# Patient Record
Sex: Male | Born: 1955 | Race: White | Hispanic: No | Marital: Married | Smoking: Former smoker
Health system: Southern US, Community
[De-identification: ages and names within clinical notes are randomized; demographics above are authoritative.]

## PROBLEM LIST (undated history)

## (undated) DIAGNOSIS — K56609 Unspecified intestinal obstruction, unspecified as to partial versus complete obstruction: Secondary | ICD-10-CM

## (undated) DIAGNOSIS — H269 Unspecified cataract: Secondary | ICD-10-CM

## (undated) DIAGNOSIS — M069 Rheumatoid arthritis, unspecified: Secondary | ICD-10-CM

## (undated) DIAGNOSIS — K635 Polyp of colon: Secondary | ICD-10-CM

## (undated) DIAGNOSIS — M1990 Unspecified osteoarthritis, unspecified site: Secondary | ICD-10-CM

## (undated) DIAGNOSIS — M359 Systemic involvement of connective tissue, unspecified: Secondary | ICD-10-CM

## (undated) DIAGNOSIS — N200 Calculus of kidney: Secondary | ICD-10-CM

## (undated) DIAGNOSIS — K5790 Diverticulosis of intestine, part unspecified, without perforation or abscess without bleeding: Secondary | ICD-10-CM

## (undated) HISTORY — DX: Polyp of colon: K63.5

## (undated) HISTORY — PX: CATARACT EXTRACTION, BILATERAL: SHX1313

## (undated) HISTORY — DX: Diverticulosis of intestine, part unspecified, without perforation or abscess without bleeding: K57.90

## (undated) HISTORY — DX: Unspecified osteoarthritis, unspecified site: M19.90

## (undated) HISTORY — DX: Rheumatoid arthritis, unspecified: M06.9

## (undated) HISTORY — DX: Unspecified cataract: H26.9

## (undated) HISTORY — DX: Unspecified intestinal obstruction, unspecified as to partial versus complete obstruction: K56.609

## (undated) HISTORY — DX: Calculus of kidney: N20.0

## (undated) HISTORY — PX: ELBOW SURGERY: SHX618

## (undated) HISTORY — PX: TONSILLECTOMY: SUR1361

---

## 2000-12-05 ENCOUNTER — Ambulatory Visit (HOSPITAL_COMMUNITY): Admission: RE | Admit: 2000-12-05 | Discharge: 2000-12-05 | Payer: Self-pay | Admitting: Family Medicine

## 2000-12-05 ENCOUNTER — Encounter: Payer: Self-pay | Admitting: Family Medicine

## 2002-02-21 ENCOUNTER — Inpatient Hospital Stay (HOSPITAL_COMMUNITY): Admission: EM | Admit: 2002-02-21 | Discharge: 2002-02-23 | Payer: Self-pay | Admitting: Emergency Medicine

## 2002-06-29 ENCOUNTER — Encounter: Payer: Self-pay | Admitting: Emergency Medicine

## 2002-06-29 ENCOUNTER — Emergency Department (HOSPITAL_COMMUNITY): Admission: EM | Admit: 2002-06-29 | Discharge: 2002-06-29 | Payer: Self-pay | Admitting: Emergency Medicine

## 2003-07-21 ENCOUNTER — Encounter: Admission: RE | Admit: 2003-07-21 | Discharge: 2003-07-21 | Payer: Self-pay | Admitting: Family Medicine

## 2003-07-21 ENCOUNTER — Encounter: Payer: Self-pay | Admitting: Family Medicine

## 2003-07-31 ENCOUNTER — Ambulatory Visit (HOSPITAL_COMMUNITY): Admission: RE | Admit: 2003-07-31 | Discharge: 2003-07-31 | Payer: Self-pay | Admitting: Urology

## 2003-09-12 ENCOUNTER — Encounter: Admission: RE | Admit: 2003-09-12 | Discharge: 2003-09-12 | Payer: Self-pay

## 2003-09-14 ENCOUNTER — Encounter: Admission: RE | Admit: 2003-09-14 | Discharge: 2003-09-14 | Payer: Self-pay

## 2003-10-18 ENCOUNTER — Emergency Department (HOSPITAL_COMMUNITY): Admission: EM | Admit: 2003-10-18 | Discharge: 2003-10-18 | Payer: Self-pay | Admitting: Emergency Medicine

## 2003-10-20 ENCOUNTER — Emergency Department (HOSPITAL_COMMUNITY): Admission: EM | Admit: 2003-10-20 | Discharge: 2003-10-20 | Payer: Self-pay | Admitting: Emergency Medicine

## 2005-06-12 ENCOUNTER — Emergency Department (HOSPITAL_COMMUNITY): Admission: EM | Admit: 2005-06-12 | Discharge: 2005-06-12 | Payer: Self-pay | Admitting: Emergency Medicine

## 2005-10-20 ENCOUNTER — Emergency Department (HOSPITAL_COMMUNITY): Admission: EM | Admit: 2005-10-20 | Discharge: 2005-10-20 | Payer: Self-pay | Admitting: Emergency Medicine

## 2006-07-24 ENCOUNTER — Encounter: Admission: RE | Admit: 2006-07-24 | Discharge: 2006-07-24 | Payer: Self-pay

## 2007-07-03 ENCOUNTER — Emergency Department (HOSPITAL_COMMUNITY): Admission: EM | Admit: 2007-07-03 | Discharge: 2007-07-03 | Payer: Self-pay | Admitting: Emergency Medicine

## 2007-07-03 ENCOUNTER — Ambulatory Visit (HOSPITAL_COMMUNITY): Admission: RE | Admit: 2007-07-03 | Discharge: 2007-07-03 | Payer: Self-pay

## 2008-12-15 DIAGNOSIS — R1030 Lower abdominal pain, unspecified: Secondary | ICD-10-CM

## 2008-12-15 DIAGNOSIS — K6289 Other specified diseases of anus and rectum: Secondary | ICD-10-CM

## 2008-12-15 DIAGNOSIS — K625 Hemorrhage of anus and rectum: Secondary | ICD-10-CM

## 2008-12-15 DIAGNOSIS — K649 Unspecified hemorrhoids: Secondary | ICD-10-CM

## 2008-12-17 DIAGNOSIS — M069 Rheumatoid arthritis, unspecified: Secondary | ICD-10-CM

## 2008-12-18 ENCOUNTER — Ambulatory Visit: Payer: Self-pay | Admitting: Gastroenterology

## 2008-12-18 DIAGNOSIS — K219 Gastro-esophageal reflux disease without esophagitis: Secondary | ICD-10-CM

## 2008-12-18 DIAGNOSIS — K644 Residual hemorrhoidal skin tags: Secondary | ICD-10-CM | POA: Insufficient documentation

## 2008-12-18 DIAGNOSIS — Z87442 Personal history of urinary calculi: Secondary | ICD-10-CM

## 2008-12-18 LAB — CONVERTED CEMR LAB
Ferritin: 101.6 ng/mL (ref 22.0–322.0)
Folate: 8.7 ng/mL
Iron: 98 ug/dL (ref 42–165)
Saturation Ratios: 26.8 % (ref 20.0–50.0)
Transferrin: 261 mg/dL (ref 212.0–360.0)
Vitamin B-12: 403 pg/mL (ref 211–911)

## 2009-09-25 ENCOUNTER — Emergency Department (HOSPITAL_COMMUNITY): Admission: EM | Admit: 2009-09-25 | Discharge: 2009-09-25 | Payer: Self-pay | Admitting: Emergency Medicine

## 2009-10-28 ENCOUNTER — Encounter: Admission: RE | Admit: 2009-10-28 | Discharge: 2010-01-26 | Payer: Self-pay | Admitting: Orthopedic Surgery

## 2011-02-11 NOTE — Consult Note (Signed)
[place]. [facility]  Patient:    Ronald Cameron, Ronald Cameron Visit Number: [id number] MRN: [id number]          Service Type: MED Location: [phone] Attending Physician:  Jackie Plum Dictated by:   Francisca December, M.D. Proc. Date: 02/21/02 Admit Date:  02/21/2002   CC:         Ronald Cameron, M.D.  Ronald Cameron., M.D.   Consultation Report  REASON FOR CONSULTATION:  Chest pain.  IMPRESSION: 1. Unstable angina pectoris without diagnostic electrocardiogram changes. 2. Tobacco abuse. 3. Rheumatoid arthritis with chronic prednisone use.  RECOMMENDATIONS:  Agree with initiation of aspirin, IV heparin, and IV nitroglycerin.  Would add Plavix 150 mg p.o. now, 75 mg p.o. q.d.  Will schedule a diagnostic cardiac catheterization, possible PTCA as soon as possible.  Goals and risks for the procedures and alternatives were discussed. The patient states his understanding.  He has his questions answered and wishes to proceed.  HISTORY OF PRESENT ILLNESS:  [name] is a 55 year old man who this morning while driving to work developed the spontaneous onset of anterior substernal aching and heavy pressure sensation.  It was not associated with nausea but he did become pale, felt lightheaded, and had a sweat.  There was mild shortness of breath.  The pain did not radiate.  He responded to baby aspirin and three sublingual nitroglycerin when he was brought to the emergency room by EMS.  In the emergency room, he has had one additional episode of chest discomfort, and in fact, complains of mild anterior substernal discomfort at this time. So far, he has received the aspirin and IV nitroglycerin.  The heparin is being administered during this dictation.  PAST MEDICAL HISTORY: 1. Rheumatoid arthritis diagnosed in 2001. 2. Status post remote tonsillectomy.  MEDICATIONS: 1. Prednisone 10 mg p.o. q.d. 2. New arthritis medicine started three  days ago, question name. 3. Calcium and vitamin D 600 mg p.o. b.i.d.  RISK FACTORS:  His cardiac risk factors include age, sex, as well as tobacco abuse.  No history of diabetes mellitus or hypertension.  There is a family history of coronary disease.  It began before the age of 46 in his mother.  SOCIAL HISTORY:  He works as a Curator.  He is married.  He has no children. He does not drink any alcohol.  He has a one pack to one and a half pack of cigarettes per day smoker.  FAMILY HISTORY:  Mother also had rheumatic heart disease and is status post bilateral CEA.  His father had bypass surgery at age 3.  Died at age 58.  ALLERGIES:  None known.  REVIEW OF SYSTEMS:  He has a got a headache currently because of nitroglycerin.  He denies any visual changes.  He has not had any difficulty swallowing.  No cough or hemoptysis.  He denies any GERD symptoms.  No abdominal pain, melena, bright red blood per rectum.  He has had no dysuria, hematuria, or nocturia.  No history of lung, liver, or renal problems.  There is no history of thyroid disease.  His rheumatoid arthritis mostly effects his hands at this point.  PHYSICAL EXAMINATION:  VITAL SIGNS:  Blood pressure is 107/71, pulse is 70 and regular, respiratory rate 18.  Temperature 97.7.  GENERAL:  This is a well-appearing 55 year old Caucasian male in no acute distress.  HEENT:  Unremarkable.  The head is normocephalic and atraumatic.  The pupils are equal, round  and reactive to light and accommodation.  Extraocular movements intact.  Oral mucosa is pink and moist.  Tongue is not coated.  NECK:  Supple without thyromegaly or masses.  The carotid upstrokes are normal.  There is no bruit.  There is no jugular venous distension.  CHEST:  Clear with adequate excursion bilaterally.  Normal vesicular breath sounds are heard.  Throughout the precordium is quiet.  Normal S1 and S2. Heart sounds are muffled.  No murmur, click, gallop, or  rub.  ABDOMEN:  Soft and nontender.  No hepatosplenomegaly or midline pulsatile masses.  GENITALIA:  Normal male phallus.  Descended testicles.  No lesions.  RECTAL:  Not performed.  EXTREMITIES:  Full range of motion.  No edema.  Intact distal pulses.  NEUROLOGICAL:  Cranial nerves II-XII intact.  Motor and sensory are grossly intact.  Gait not tested.  The hands show mild MTP synovitis without significant tenderness.  No rheumatoid deformity.  SKIN:  Warm, dry, and clear.  LABORATORY DATA:  Initial CK is 87, MB 1.4, troponin 0.04.  Serum electrolytes, BUN, creatinine, glucose normal.  Admission hemogram is normal. Calcium is 8.5.  Electrocardiogram:  Three tracings are available for review (1608, 1841, and 1842) all from the 29th.  They show sinus rhythm, right axis and early repolarization.  No clear evidence of significant ST elevation or change. There is J-point elevation in V2 and V3.  The second tracing was done while he had a recurrence of his chest discomfort.  COMMENTS:  The patient displays symptoms entirely compatible with unstable angina pectoris.  He has had some waxing and waning of the discomfort.  He does not have significant gastroesophageal reflux disease history that might explain this.  Despite the absence of EKG findings, I am concerned as you are that this is the new presentation of coronary artery disease and should be treated as such until proven otherwise.  The plan is outlined above.  Thank you very much for allowing me to assist in the care of [name].  It is my pleasure to do so.  I will discuss further care with you. Dictated by:   Francisca December, M.D. Attending Physician:  Jackie Plum DD:  02/21/02 TD:  02/23/02 Job: [id number]

## 2011-02-11 NOTE — Discharge Summary (Signed)
[place]. [facility]  Patient:    Ronald Cameron, Ronald Cameron Visit Number: [id number] MRN: [id number]          Service Type: MED Location: [phone] Attending Physician:  Jackie Plum Dictated by:   Jackie Plum, M.D. Admit Date:  02/21/2002 Discharge Date: 02/23/2002   CC:         Ronald Maduro A. Eliezer Lofts., M.D.  Francisca December, M.D.   Discharge Summary  DISCHARGE DIAGNOSES 1. Chest pain, resolved, non cardiac in etiology.    a. Cardiac catheterization done on Feb 22, 2002, notable for nonobstructive       atherosclerotic coronary artery disease with normal left ventricular       function. Official report pending. 2. A 20 to 30 pack year history of cigarette smoking. 3. Rheumatoid arthritis diagnosed one year ago on chronic prednisone. 4. Status post tonsillectomy.  DISCHARGE LABS: White blood cell count 9.1 hemoglobin 14.2, hematocrit 41.7, platelet count 294. PTT 26. Sodium 138, potassium 3.7, chloride 109, CO2 25, glucose 110, BUN 22, creatinine 0.8, calcium 8.2.  DISCHARGE MEDICATIONS 1. Prednisone 10 mg p.o. q.d. 2. Nicotine patch 21 mg/24 hour patch transdermal q.d. 3. Protonix 40 mg p.o. q.d.  DISCHARGE INSTRUCTIONS: As tolerated. Regular diet. Special instructions: Stop smoking. The detrimental effect of smoking on his general health including his cardiac health has been discussed with the patient.  FOLLOWUP APPOINTMENTS: The patient is to call Dr. Nicholos Johns for a followup appointment, his primary care physician within two weeks.  CONSULTS: Dr. Amil Amen of cardiology.  PROCEDURES PERFORMED: Cardiac catheterization on Feb 22, 2002.  CONDITION ON DISCHARGE: Improved and stable.  DISPOSITION: The patient is to go home with his family.  HISTORY OF PRESENT ILLNESS: The patient is a 55 year old gentleman with CAD risk factors of male sex, cigarette smoking and positive family history for CAD, who presented on Feb 21, 2002, with chest  pain. The pain was said to be in his left sternal border area and started while he was driving to work. He felt dizzy, pale and diaphoretic. He also had an episode of mild shortness of breath. The patient was rated as 6/10. It was not radiating. There was no history of nausea.  On evaluation in the emergency department the patient was given sublingual nitroglycerin x 3 with good response with respect to his pain.  PHYSICAL EXAMINATION  VITAL SIGNS: Admission physical examination was notable for a blood pressure of 107/71 with a pulse of 69, respiratory rate of 18 and oxygen saturation of 97% on room air.  GENERAL: Physical examination was notable for absence of any noted JVD. He was not pale.  LUNGS: The lung examination was clear without any chest wall tenderness.  STUDIES: EKG was notable for normal sinus rhythm with right atrial dilatation. X-ray was notable for absence of any acute infiltrate.  HOSPITAL COURSE: The patient was admitted for suspected unstable angina to the step-down unit. He was started on oxygen supplementation, aspirin and heparin, Plavix and nitroglycerin drip. A myocardial infarction was ruled out by serial cardiac enzymes and 12-lead EKG. Dr. Amil Amen was consulted and the patient had a cardiac catheterization yesterday. The results are mentioned above. At the time of discharge the patient was chest pain free. He was hemodynamically stable. His hemoglobin and PTT are mentioned above. On account of the cardiac catheterization findings, it was agreed that the source of the patients chest pain is not cardiac, possibly GI, and the patient was started on Protonix for presumptive  GI etiology. Should the patient experience any further chest pain, noncardiac sources should be looked for.  Tobacco use. The patient is a cigarette smoker as mentioned above. He has been started on a nicotine patch. He has received extensive counseling regarding the effects of  cigarette smoking on his general health including his cardiac health. He expressed understanding and we recommend that he received an outpatient cigarette cessation reinforced counseling. Dictated by:   Jackie Plum, M.D. Attending Physician:  Jackie Plum DD:  02/23/02 TD:  02/25/02 Job: [id number]

## 2011-02-11 NOTE — Cardiovascular Report (Signed)
[place]. [facility]  Patient:    Ronald Cameron, Ronald Cameron Visit Number: [id number] MRN: [id number]          Service Type: MED Location: [phone] Attending Physician:  Jackie Plum Dictated by:   Armanda Magic, M.D. Proc. Date: 02/22/02 Admit Date:  02/21/2002 Discharge Date: 02/23/2002   CC:         [name], M.D.  Anastasio Auerbach, M.D.  Francisca December, M.D.   Cardiac Catheterization  REFERRING PHYSICIANS: [name], M.D. and Anastasio Auerbach, M.D.  PROCEDURES PERFORMED: Left heart catheterization, coronary angiography, left ventriculography.  OPERATOR: Armanda Magic, M.D.  INDICATIONS: Chest pain.  COMPLICATIONS: None.  INTERVENOUS ACCESS: Via right femoral artery, 6 French sheath.  HISTORY OF PRESENT ILLNESS: This is a 55 year old male with cardiac risks factors including age, sex, tobacco use, positive family history of coronary disease, who was admitted with substernal chest pain. He ruled out for myocardial infarction by serial enzymes. Dr. Amil Amen was consulted and felt the patient needed cardiac catheterization and we are now performing cardiac catheterization for further evaluation of chest pain.  DESCRIPTION OF PROCEDURE: The patient is brought to the cardiac catheterization laboratory in a fasting, nonsedated state.  Informed consent was obtained.  The patient was connected to continuous heart rate and pulse oximetry monitoring, and intermittent blood pressure monitoring.  The right groin was prepped and draped in a sterile fashion. Xylocaine 1% was used for local anesthesia.  Using the modified Seldinger technique, a 6 French sheath was placed in the right femoral artery.  Under fluoroscopic guidance, a 6 [place] JL4 catheter was placed in the left coronary artery. Multiple cine films were taken in a 30-degree RAO, 40-degree LAO views. This catheter was then exchanged out over a wire and a 6 [place] Judkins JR4  catheter, which was placed under fluoroscopic guidance in the right coronary artery. Multiple cine films were taken in a 30-degree RAO, 40-degree LAO views. This catheter was then exchanged out over a wire and a 6 French angled pigtail catheter, which was placed under fluoroscopic guidance in the left ventricular cavity. Left ventriculography was performed in a 30-degree RAO view using a total of 30 cc of contrast at 13 cc/sec. The catheter was then pulled back across the aortic valve with no significant pressure gradient. At the end of the procedure, all catheters and sheaths were removed. Manual compression was performed until adequate hemostasis obtained. The patient was transferred back to the room in stable condition.  RESULTS: 1. The left main coronary artery is widely patent and bifurcates into a    left anterior descending artery and left circumflex artery. 2. The left anterior descending artery is widely patent throughout its    course except for some luminal irregularities. It gives rise to one    large diagonal branch which is widely patent. 3. Left circumflex is widely patent throughout its course and gives rise to    three obtuse marginal branches. The first obtuse marginal branch has a    proximal 30% narrowing. The second obtuse marginal branch is very small    and the third obtuse marginal branch has a 50-60% ostial narrowing. 4. Right coronary artery is widely patent throughout its course except for    luminal irregularities up to 20%. There is a distal 30% narrowing just    before the bifurcation of the posterior descending artery and    posterolateral artery, both of which are widely patent except for  some    luminal irregularities in the posterolateral artery up to 20%.  LEFT VENTRICULOGRAM: The left ventriculography performed in the 30-degree RAO view using a total of 30 cc of contrast at 13 cc/sec. showed normal LV function. No regional wall motion abnormalities.  Aortic pressure 103/62, LV 113/23 mmHg.  ASSESSMENT: 1. Noncardiac chest pain. 2. Nonobstructive coronary disease. 3. Normal left ventricular function.  PLAN: Further plans per Dr. Amil Amen. Dictated by:   Armanda Magic, M.D. Attending Physician:  Jackie Plum DD:  04/12/02 TD:  04/17/02 Job: [id number]

## 2011-03-22 ENCOUNTER — Other Ambulatory Visit: Payer: Self-pay | Admitting: Internal Medicine

## 2011-03-22 ENCOUNTER — Ambulatory Visit
Admission: RE | Admit: 2011-03-22 | Discharge: 2011-03-22 | Disposition: A | Discharge status: Home / Self Care | Payer: Worker's Compensation | Source: Ambulatory Visit | Attending: Internal Medicine | Admitting: Internal Medicine

## 2011-03-22 DIAGNOSIS — S0992XA Unspecified injury of nose, initial encounter: Secondary | ICD-10-CM

## 2011-07-07 LAB — CBC
HCT: 39.3
Hemoglobin: 13.6
MCHC: 34.7
MCV: 92.4
Platelets: 366
RBC: 4.26
RDW: 14.3 — ABNORMAL HIGH
WBC: 10.6 — ABNORMAL HIGH

## 2011-07-07 LAB — DIFFERENTIAL
Basophils Absolute: 0
Basophils Relative: 0
Eosinophils Absolute: 0.6
Eosinophils Relative: 6 — ABNORMAL HIGH
Lymphocytes Relative: 20
Lymphs Abs: 2.1
Monocytes Absolute: 0.7
Monocytes Relative: 6
Neutro Abs: 7.2
Neutrophils Relative %: 68

## 2011-07-07 LAB — SEDIMENTATION RATE: Sed Rate: 45 — ABNORMAL HIGH

## 2013-12-16 ENCOUNTER — Encounter: Payer: Self-pay | Admitting: Family Medicine

## 2013-12-16 ENCOUNTER — Telehealth: Payer: Self-pay | Admitting: Nurse Practitioner

## 2013-12-16 ENCOUNTER — Ambulatory Visit (INDEPENDENT_AMBULATORY_CARE_PROVIDER_SITE_OTHER): Payer: BC Managed Care – PPO | Admitting: Family Medicine

## 2013-12-16 ENCOUNTER — Ambulatory Visit (INDEPENDENT_AMBULATORY_CARE_PROVIDER_SITE_OTHER): Payer: BC Managed Care – PPO

## 2013-12-16 VITALS — BP 121/69 | HR 70 | Temp 99.9°F | Ht 71.0 in | Wt 199.0 lb

## 2013-12-16 DIAGNOSIS — B9689 Other specified bacterial agents as the cause of diseases classified elsewhere: Secondary | ICD-10-CM

## 2013-12-16 DIAGNOSIS — Z87442 Personal history of urinary calculi: Secondary | ICD-10-CM

## 2013-12-16 DIAGNOSIS — M71121 Other infective bursitis, right elbow: Secondary | ICD-10-CM | POA: Insufficient documentation

## 2013-12-16 DIAGNOSIS — A499 Bacterial infection, unspecified: Secondary | ICD-10-CM

## 2013-12-16 DIAGNOSIS — K219 Gastro-esophageal reflux disease without esophagitis: Secondary | ICD-10-CM

## 2013-12-16 DIAGNOSIS — M7020 Olecranon bursitis, unspecified elbow: Secondary | ICD-10-CM

## 2013-12-16 DIAGNOSIS — M25529 Pain in unspecified elbow: Secondary | ICD-10-CM

## 2013-12-16 DIAGNOSIS — M069 Rheumatoid arthritis, unspecified: Secondary | ICD-10-CM

## 2013-12-16 MED ORDER — CEPHALEXIN 500 MG PO CAPS: 500.0000 mg | ORAL_CAPSULE | Freq: Four times a day (QID) | ORAL | Status: DC

## 2013-12-16 MED ORDER — CEFTRIAXONE SODIUM 1 G IJ SOLR
1.0000 g | Freq: Once | INTRAMUSCULAR | Status: AC
  Administered 2013-12-16: 1 g via INTRAMUSCULAR

## 2013-12-16 MED ORDER — SULFAMETHOXAZOLE-TMP DS 800-160 MG PO TABS: 1.0000 | ORAL_TABLET | Freq: Two times a day (BID) | ORAL | Status: DC

## 2013-12-16 NOTE — Telephone Encounter (Signed)
WIFE STATES PT ELBOW IS DRAINING BLOOD AND VERY SWOLLEN. PT IS FOLLOWED BY A RHEUMATOLOGIST. I ADVISED TO CALL RHEUMATOLOGIST AND SEE IS THEY WANT TO SEE HIM. PT IS ON MULTIPLE MEDICATIONS FOR ARTHRITIS. MAY NEED ER VISIT TODAY. WIFE VERBALIZED UNDERSTANDING.

## 2013-12-16 NOTE — Patient Instructions (Addendum)
Olecranon Bursitis Bursitis is swelling and soreness (inflammation) of a fluid-filled sac (bursa) that covers and protects a joint. Olecranon bursitis occurs over the elbow.  CAUSES Bursitis can be caused by injury, overuse of the joint, arthritis, or infection.  SYMPTOMS   Tenderness, swelling, warmth, or redness over the elbow.  Elbow pain with movement. This is greater with bending the elbow.  Squeaking sound when the bursa is rubbed or moved.  Increasing size of the bursa without pain or discomfort.  Fever with increasing pain and swelling if the bursa becomes infected. HOME CARE INSTRUCTIONS   Put ice on the affected area.  Put ice in a plastic bag.  Place a towel between your skin and the bag.  Leave the ice on for 15-20 minutes each hour while awake. Do this for the first 2 days.  When resting, elevate your elbow above the level of your heart. This helps reduce swelling.  Continue to put the joint through a full range of motion 4 times per day. Rest the injured joint at other times. When the pain lessens, begin normal slow movements and usual activities.  Only take over-the-counter or prescription medicines for pain, discomfort, or fever as directed by your caregiver.  Reduce your intake of milk and related dairy products (cheese, yogurt). They may make your condition worse. SEEK IMMEDIATE MEDICAL CARE IF:   Your pain increases even during treatment.  You have a fever.  You have heat and inflammation over the bursa and elbow.  You have a red line that goes up your arm.  You have pain with movement of your elbow. MAKE SURE YOU:   Understand these instructions.  Will watch your condition.  Will get help right away if you are not doing well or get worse. Document Released: 10/12/2006 Document Revised: 12/05/2011 Document Reviewed: 08/28/2007 [facility] Patient Information 2014 [place], [place]. Ceftriaxone injection What is this medicine? CEFTRIAXONE (sef try AX  one) is a cephalosporin antibiotic. It is used to treat certain kinds of bacterial infections. It will not work for colds, flu, or other viral infections. This medicine may be used for other purposes; ask your health care provider or pharmacist if you have questions. COMMON BRAND NAME(S): Rocephin What should I tell my health care provider before I take this medicine? They need to know if you have any of these conditions: -any chronic illness -bowel disease, like colitis -both kidney and liver disease -high bilirubin level in newborn patients -an unusual or allergic reaction to ceftriaxone, other cephalosporin or penicillin antibiotics, foods, dyes or preservatives -pregnant or trying to get pregnant -breast-feeding How should I use this medicine? This medicine is injected into a muscle or infused it into a vein. It is usually given in a medical office or clinic. If you are to give this medicine you will be taught how to inject it. Follow instructions carefully. Use your doses at regular intervals. Do not take your medicine more often than directed. Do not skip doses or stop your medicine early even if you feel better. Do not stop taking except on your doctor's advice. Talk to your pediatrician regarding the use of this medicine in children. Special care may be needed. Overdosage: If you think you have taken too much of this medicine contact a poison control center or emergency room at once. NOTE: This medicine is only for you. Do not share this medicine with others. What if I miss a dose? If you miss a dose, take it as soon as you  can. If it is almost time for your next dose, take only that dose. Do not take double or extra doses. What may interact with this medicine? Do not take this medicine with any of the following medications: -intravenous calcium This list may not describe all possible interactions. Give your health care provider a list of all the medicines, herbs, non-prescription  drugs, or dietary supplements you use. Also tell them if you smoke, drink alcohol, or use illegal drugs. Some items may interact with your medicine. What should I watch for while using this medicine? Tell your doctor or health care professional if your symptoms do not improve or if they get worse. Do not treat diarrhea with over the counter products. Contact your doctor if you have diarrhea that lasts more than 2 days or if it is severe and watery. If you are being treated for a sexually transmitted disease, avoid sexual contact until you have finished your treatment. Having sex can infect your sexual partner. Calcium may bind to this medicine and cause lung or kidney problems. Avoid calcium products while taking this medicine and for 48 hours after taking the last dose of this medicine. What side effects may I notice from receiving this medicine? Side effects that you should report to your doctor or health care professional as soon as possible: -allergic reactions like skin rash, itching or hives, swelling of the face, lips, or tongue -breathing problems -fever, chills -irregular heartbeat -pain when passing urine -seizures -stomach pain, cramps -unusual bleeding, bruising -unusually weak or tired Side effects that usually do not require medical attention (report to your doctor or health care professional if they continue or are bothersome): -diarrhea -dizzy, drowsy -headache -nausea, vomiting -pain, swelling, irritation where injected -stomach upset -sweating This list may not describe all possible side effects. Call your doctor for medical advice about side effects. You may report side effects to FDA at 1-800-FDA-1088. Where should I keep my medicine? Keep out of the reach of children. Store at room temperature below 25 degrees C (77 degrees F). Protect from light. Throw away any unused vials after the expiration date. NOTE: This sheet is a summary. It may not cover all possible  information. If you have questions about this medicine, talk to your doctor, pharmacist, or health care provider.  2014, [employer]vier/Gold Standard. (2012-09-24 15:34:57)

## 2013-12-16 NOTE — Progress Notes (Signed)
Patient ID: Ronald Cameron, male   DOB: Dec 15, 1955, 58 y.o.   MRN: [id number] SUBJECTIVE: CC: Chief Complaint  Patient presents with  . right elbow pain    drainging, started yesterday    HPI: Is on methotrexate for RA.  Easy to get infections. Yesterday the right elbow got red and swollen. Then started to leak yellow fluid and he squeezed it out and cleaned it but it has gotten redder and warmer. The joint is okay for now.  no fever.  Past Medical History  Diagnosis Date  . Arthritis    Past Surgical History  Procedure Laterality Date  . Tonsillectomy     History   Social History  . Marital Status: Married    Spouse Name: N/A    Number of Children: N/A  . Years of Education: N/A   Occupational History  . Not on file.   Social History Main Topics  . Smoking status: Current Every Day Smoker  . Smokeless tobacco: Not on file  . Alcohol Use: No  . Drug Use: No  . Sexual Activity: Not on file   Other Topics Concern  . Not on file   Social History Narrative  . No narrative on file   History reviewed. No pertinent family history. No current outpatient prescriptions on file prior to visit.   No current facility-administered medications on file prior to visit.   No Known Allergies  There is no immunization history on file for this patient. Prior to Admission medications   Medication Sig Start Date End Date Taking? Authorizing Provider  Ascorbic Acid (VITAMIN C) 1000 MG tablet Take 1,000 mg by mouth daily.   Yes Historical Provider, MD  aspirin 81 MG tablet Take 81 mg by mouth daily.   Yes Historical Provider, MD  calcium carbonate (OS-CAL) 600 MG TABS tablet Take 600 mg by mouth daily with breakfast.   Yes Historical Provider, MD  folic acid (FOLVITE) 790 MCG tablet Take 800 mcg by mouth daily.   Yes Historical Provider, MD  methotrexate (RHEUMATREX) 2.5 MG tablet Take 2.5 mg by mouth once a week. Pt takes 10 tablets weekly. Caution:Chemotherapy. Protect  from light.   Yes Historical Provider, MD  predniSONE (DELTASONE) 5 MG tablet Take 5 mg by mouth daily with breakfast.   Yes Historical Provider, MD  traZODone (DESYREL) 100 MG tablet Take 100 mg by mouth at bedtime as needed for sleep.   Yes Historical Provider, MD  cephALEXin (KEFLEX) 500 MG capsule Take 1 capsule (500 mg total) by mouth 4 (four) times daily. 12/16/13   Vernie Shanks, MD  sulfamethoxazole-trimethoprim (BACTRIM DS) 800-160 MG per tablet Take 1 tablet by mouth 2 (two) times daily. 12/16/13   Vernie Shanks, MD     ROS: As above in the HPI. All other systems are stable or negative.  OBJECTIVE: APPEARANCE:  Patient in no acute distress.The patient appeared well nourished and normally developed. Acyanotic. Waist: VITAL SIGNS:BP 121/69  Pulse 70  Temp(Src) 99.9 F (37.7 C) (Oral)  Ht 5\' 11"  (1.803 m)  Wt 199 lb (90.266 kg)  BMI 27.77 kg/m2 WM  SKIN: warm and  Dry without overt rashes, tattoos and scars  HEAD and Neck: without JVD, Head and scalp: normal Eyes:No scleral icterus. Fundi normal, eye movements normal. Ears: Auricle normal, canal normal, Tympanic membranes normal, insufflation normal. Nose: normal Throat: normal Neck & thyroid: normal  CHEST & LUNGS: Chest wall: normal Lungs: Clear  CVS: Reveals the PMI to be normally  located. Regular rhythm, First and Second Heart sounds are normal,  absence of murmurs, rubs or gallops. Peripheral vasculature: Radial pulses: normal Dorsal pedis pulses: normal Posterior pulses: normal  ABDOMEN:  Appearance: normal Benign, no organomegaly, no masses, no Abdominal Aortic enlargement. No Guarding , no rebound. No Bruits. Bowel sounds: normal  RECTAL: N/A GU: N/A  EXTREMETIES: nonedematous.  MUSCULOSKELETAL:  Spine: normal Joints:the right olecranon area has a 4 cm red area over the olecranon bursa. There is drainage out of multiple openings. The fluid is cloudy.. There is warmth.  NEUROLOGIC: oriented  to time,place and person; nonfocal. Strength is normal Sensory is normal Reflexes are normal Cranial Nerves are normal.  ASSESSMENT: Septic olecranon bursitis of right elbow - Plan: sulfamethoxazole-trimethoprim (BACTRIM DS) 800-160 MG per tablet, cephALEXin (KEFLEX) 500 MG capsule, Aerobic culture, Ambulatory referral to Orthopedic Surgery, cefTRIAXone (ROCEPHIN) injection 1 g  Pain in elbow - Plan: DG Elbow 2 Views Right, Aerobic culture  RHEUMATOID ARTHRITIS  NEPHROLITHIASIS, HX OF  GERD  PLAN: Area cleaned and dressed. Handout on olecranon bursitis.  Orders Placed This Encounter  Procedures  . Aerobic culture  . DG Elbow 2 Views Right    Standing Status: Future     Number of Occurrences: 1     Standing Expiration Date: 02/15/2015    Order Specific Question:  Reason for Exam (SYMPTOM  OR DIAGNOSIS REQUIRED)    Answer:  pain    Order Specific Question:  Preferred imaging location?    Answer:  Internal  . Ambulatory referral to Orthopedic Surgery    Referral Priority:  Urgent    Referral Type:  Surgical    Referral Reason:  Specialty Services Required    Requested Specialty:  Orthopedic Surgery    Number of Visits Requested:  1  [facility] reading (PRIMARY) by  Dr.Saajan Willmon: no acute bony abnormality                                Meds ordered this encounter  Medications  . methotrexate (RHEUMATREX) 2.5 MG tablet    Sig: Take 2.5 mg by mouth once a week. Pt takes 10 tablets weekly. Caution:Chemotherapy. Protect from light.  . predniSONE (DELTASONE) 5 MG tablet    Sig: Take 5 mg by mouth daily with breakfast.  . folic acid (FOLVITE) 270 MCG tablet    Sig: Take 800 mcg by mouth daily.  Marland Kitchen aspirin 81 MG tablet    Sig: Take 81 mg by mouth daily.  . Ascorbic Acid (VITAMIN C) 1000 MG tablet    Sig: Take 1,000 mg by mouth daily.  . traZODone (DESYREL) 100 MG tablet    Sig: Take 100 mg by mouth at bedtime as needed for sleep.  . calcium carbonate (OS-CAL) 600 MG TABS tablet     Sig: Take 600 mg by mouth daily with breakfast.  . sulfamethoxazole-trimethoprim (BACTRIM DS) 800-160 MG per tablet    Sig: Take 1 tablet by mouth 2 (two) times daily.    Dispense:  20 tablet    Refill:  0  . cephALEXin (KEFLEX) 500 MG capsule    Sig: Take 1 capsule (500 mg total) by mouth 4 (four) times daily.    Dispense:  40 capsule    Refill:  0  . cefTRIAXone (ROCEPHIN) injection 1 g    Sig:    There are no discontinued medications. Return in about 1 day (around 12/17/2013) for Recheck medical problems.  Novali Vollman P. Jacelyn Grip, M.D.

## 2013-12-16 NOTE — Progress Notes (Signed)
PT TOLERATED ROCEPHIN INJECTION WITHOUT DIFFICULTY

## 2013-12-17 ENCOUNTER — Encounter: Payer: Self-pay | Admitting: Family Medicine

## 2013-12-17 ENCOUNTER — Ambulatory Visit: Payer: Self-pay | Admitting: Family Medicine

## 2013-12-17 ENCOUNTER — Ambulatory Visit (INDEPENDENT_AMBULATORY_CARE_PROVIDER_SITE_OTHER): Payer: BC Managed Care – PPO | Admitting: Family Medicine

## 2013-12-17 VITALS — BP 126/71 | HR 67 | Temp 98.6°F | Ht 71.0 in | Wt 199.0 lb

## 2013-12-17 DIAGNOSIS — B9689 Other specified bacterial agents as the cause of diseases classified elsewhere: Secondary | ICD-10-CM

## 2013-12-17 DIAGNOSIS — M71121 Other infective bursitis, right elbow: Secondary | ICD-10-CM

## 2013-12-17 DIAGNOSIS — M7020 Olecranon bursitis, unspecified elbow: Secondary | ICD-10-CM

## 2013-12-17 DIAGNOSIS — A499 Bacterial infection, unspecified: Secondary | ICD-10-CM

## 2013-12-17 DIAGNOSIS — M069 Rheumatoid arthritis, unspecified: Secondary | ICD-10-CM

## 2013-12-17 NOTE — Progress Notes (Signed)
Patient ID: Ronald Cameron, male   DOB: [birth year], 57 y.o.   MRN: [id number] SUBJECTIVE: CC: Chief Complaint  Patient presents with  . Follow-up    1 day reck rt elbow.  draining serous sangious fluid    HPI: Next day recheck of septic arthritis. Still draining. No fever. Still sore . Elbow can move without problems.  Past Medical History  Diagnosis Date  . Arthritis    Past Surgical History  Procedure Laterality Date  . Tonsillectomy     History   Social History  . Marital Status: Married    Spouse Name: N/A    Number of Children: N/A  . Years of Education: N/A   Occupational History  . Not on file.   Social History Main Topics  . Smoking status: Current Every Day Smoker  . Smokeless tobacco: Not on file  . Alcohol Use: No  . Drug Use: No  . Sexual Activity: Not on file   Other Topics Concern  . Not on file   Social History Narrative  . No narrative on file   No family history on file. Current Outpatient Prescriptions on File Prior to Visit  Medication Sig Dispense Refill  . Ascorbic Acid (VITAMIN C) 1000 MG tablet Take 1,000 mg by mouth daily.      Marland Kitchen aspirin 81 MG tablet Take 81 mg by mouth daily.      . calcium carbonate (OS-CAL) 600 MG TABS tablet Take 600 mg by mouth daily with breakfast.      . cephALEXin (KEFLEX) 500 MG capsule Take 1 capsule (500 mg total) by mouth 4 (four) times daily.  40 capsule  0  . folic acid (FOLVITE) 767 MCG tablet Take 800 mcg by mouth daily.      . methotrexate (RHEUMATREX) 2.5 MG tablet Take 2.5 mg by mouth once a week. Pt takes 10 tablets weekly. Caution:Chemotherapy. Protect from light.      . predniSONE (DELTASONE) 5 MG tablet Take 5 mg by mouth daily with breakfast.      . sulfamethoxazole-trimethoprim (BACTRIM DS) 800-160 MG per tablet Take 1 tablet by mouth 2 (two) times daily.  20 tablet  0  . traZODone (DESYREL) 100 MG tablet Take 100 mg by mouth at bedtime as needed for sleep.       No current  facility-administered medications on file prior to visit.   No Known Allergies  There is no immunization history on file for this patient. Prior to Admission medications   Medication Sig Start Date End Date Taking? Authorizing Provider  Ascorbic Acid (VITAMIN C) 1000 MG tablet Take 1,000 mg by mouth daily.   Yes Historical Provider, MD  aspirin 81 MG tablet Take 81 mg by mouth daily.   Yes Historical Provider, MD  calcium carbonate (OS-CAL) 600 MG TABS tablet Take 600 mg by mouth daily with breakfast.   Yes Historical Provider, MD  cephALEXin (KEFLEX) 500 MG capsule Take 1 capsule (500 mg total) by mouth 4 (four) times daily. 12/16/13  Yes Vernie Shanks, MD  folic acid (FOLVITE) 341 MCG tablet Take 800 mcg by mouth daily.   Yes Historical Provider, MD  methotrexate (RHEUMATREX) 2.5 MG tablet Take 2.5 mg by mouth once a week. Pt takes 10 tablets weekly. Caution:Chemotherapy. Protect from light.   Yes Historical Provider, MD  predniSONE (DELTASONE) 5 MG tablet Take 5 mg by mouth daily with breakfast.   Yes Historical Provider, MD  sulfamethoxazole-trimethoprim (BACTRIM DS) 800-160 MG per tablet Take  1 tablet by mouth 2 (two) times daily. 12/16/13  Yes Vernie Shanks, MD  traZODone (DESYREL) 100 MG tablet Take 100 mg by mouth at bedtime as needed for sleep.   Yes Historical Provider, MD     ROS: As above in the HPI. All other systems are stable or negative.  OBJECTIVE: APPEARANCE:  Patient in no acute distress.The patient appeared well nourished and normally developed. Acyanotic. Waist: VITAL SIGNS:BP 126/71  Pulse 67  Temp(Src) 98.6 F (37 C) (Oral)  Ht 5\' 11"  (1.803 m)  Wt 199 lb (90.266 kg)  BMI 27.77 kg/m2   SKIN: warm and  Dry  The olecranon area is less red. Still draining. Swelling is a little less. Cloudy fluid.  HEAD and Neck: without JVD, Head and scalp: normal Eyes:No scleral icterus. Fundi normal, eye movements normal. Ears: Auricle normal, canal normal, Tympanic  membranes normal, insufflation normal. Nose: normal Throat: normal Neck & thyroid: normal  CHEST & LUNGS: Chest wall: normal Lungs: Clear  CVS: Reveals the PMI to be normally located. Regular rhythm, First and Second Heart sounds are normal,  absence of murmurs, rubs or gallops. Peripheral vasculature: Radial pulses: normal Dorsal pedis pulses: normal Posterior pulses: normal  EXTREMETIES: right elbow, olecranon area as above in the skin exam. Less warmth as well. 40 % better.  MUSCULOSKELETAL:  Joints:right elbow FROM . Only area of pain is the right olecranon bursa.   NEUROLOGIC: oriented to time,place and person; nonfocal.  Results for orders placed in visit on 12/18/08  CONVERTED [facility]      Result Value Ref Range   Vitamin B-12 403  211-911 pg/mL   Ferritin 101.6  22.0-322.0 ng/mL   Folate 8.7     Iron 98  42-165 mcg/dL   Transferrin 261.0  212.0-360.0 mg/dL   Saturation Ratios 26.8  20.0-50.0 %    ASSESSMENT:  Rheumatoid arthritis(714.0)  Septic olecranon bursitis of right elbow  PLAN: Continue with 2 antibiotics. Referred to orthopedics yesterday. If not seen in 2 days RTc to see me for recheck. Stop smoking.  No orders of the defined types were placed in this encounter.   No orders of the defined types were placed in this encounter.   There are no discontinued medications. Return if symptoms worsen or fail to improve.  Takoda Janowiak P. Jacelyn Grip, M.D.

## 2013-12-18 ENCOUNTER — Telehealth: Payer: Self-pay | Admitting: Family Medicine

## 2013-12-20 LAB — AEROBIC CULTURE

## 2014-05-23 ENCOUNTER — Ambulatory Visit (INDEPENDENT_AMBULATORY_CARE_PROVIDER_SITE_OTHER): Payer: BC Managed Care – PPO | Admitting: Family Medicine

## 2014-05-23 ENCOUNTER — Encounter: Payer: Self-pay | Admitting: Family Medicine

## 2014-05-23 VITALS — BP 104/65 | HR 73 | Temp 97.9°F | Ht 70.5 in | Wt 201.0 lb

## 2014-05-23 DIAGNOSIS — L0293 Carbuncle, unspecified: Secondary | ICD-10-CM

## 2014-05-23 DIAGNOSIS — L0292 Furuncle, unspecified: Secondary | ICD-10-CM

## 2014-05-23 MED ORDER — CEPHALEXIN 500 MG PO CAPS: 500.0000 mg | ORAL_CAPSULE | Freq: Four times a day (QID) | ORAL | Status: DC

## 2014-05-23 NOTE — Progress Notes (Signed)
   Subjective:    Patient ID: Ronald Cameron, male    DOB: Feb 17, 1956, 58 y.o.   MRN: [id number]  HPI C/o boil on back that has been bothering him for 2 days   Review of Systems C/o boil on back. No chest pain, SOB, HA, dizziness, vision change, N/V, diarrhea, constipation, dysuria, urinary urgency or frequency, myalgias, arthralgias or rash.     Objective:   Physical Exam  Sebaceous cyst on back.  Procedure - Cyst cleansed with betadine and then injected with 3 cc's of lidocaine with epinephrine And once adequate anesthesia is acquired a horizontal incision is made and copious amount of sebum is removed and then most of the sack is excised away and then 1/4 inch iodophor packing is Packed into the wound and then 4x4 dressing       Assessment & Plan:  Furuncle - Plan: cephALEXin (KEFLEX) 500 MG capsule po qid x 7 days. Discussed with patient that he can follow up for wound check in 3 days.  He  States he has taken care of this on his own and will follow up if he needs follow up.  Lysbeth Penner FNP

## 2015-02-10 ENCOUNTER — Other Ambulatory Visit: Payer: Self-pay | Admitting: Rheumatology

## 2015-02-10 DIAGNOSIS — R188 Other ascites: Secondary | ICD-10-CM

## 2015-02-13 ENCOUNTER — Ambulatory Visit
Admission: RE | Admit: 2015-02-13 | Discharge: 2015-02-13 | Disposition: A | Discharge status: Home / Self Care | Payer: BLUE CROSS/BLUE SHIELD | Source: Ambulatory Visit | Attending: Rheumatology | Admitting: Rheumatology

## 2015-02-13 DIAGNOSIS — R188 Other ascites: Secondary | ICD-10-CM

## 2015-02-17 ENCOUNTER — Emergency Department (HOSPITAL_COMMUNITY)
Admission: EM | Admit: 2015-02-17 | Discharge: 2015-02-17 | Disposition: A | Discharge status: Home / Self Care | Payer: BLUE CROSS/BLUE SHIELD | Attending: Emergency Medicine | Admitting: Emergency Medicine

## 2015-02-17 ENCOUNTER — Encounter (HOSPITAL_COMMUNITY): Payer: Self-pay | Admitting: *Deleted

## 2015-02-17 ENCOUNTER — Emergency Department (HOSPITAL_COMMUNITY): Payer: BLUE CROSS/BLUE SHIELD

## 2015-02-17 DIAGNOSIS — Z79899 Other long term (current) drug therapy: Secondary | ICD-10-CM | POA: Insufficient documentation

## 2015-02-17 DIAGNOSIS — Z792 Long term (current) use of antibiotics: Secondary | ICD-10-CM | POA: Diagnosis not present

## 2015-02-17 DIAGNOSIS — R42 Dizziness and giddiness: Secondary | ICD-10-CM | POA: Diagnosis not present

## 2015-02-17 DIAGNOSIS — M1990 Unspecified osteoarthritis, unspecified site: Secondary | ICD-10-CM | POA: Insufficient documentation

## 2015-02-17 DIAGNOSIS — Z7982 Long term (current) use of aspirin: Secondary | ICD-10-CM | POA: Diagnosis not present

## 2015-02-17 DIAGNOSIS — R140 Abdominal distension (gaseous): Secondary | ICD-10-CM | POA: Diagnosis not present

## 2015-02-17 DIAGNOSIS — Z87891 Personal history of nicotine dependence: Secondary | ICD-10-CM | POA: Insufficient documentation

## 2015-02-17 DIAGNOSIS — Z7952 Long term (current) use of systemic steroids: Secondary | ICD-10-CM | POA: Diagnosis not present

## 2015-02-17 DIAGNOSIS — R1084 Generalized abdominal pain: Secondary | ICD-10-CM | POA: Diagnosis present

## 2015-02-17 NOTE — ED Notes (Signed)
Pt sent here for evaluation from Urgent Care

## 2015-02-17 NOTE — Discharge Instructions (Signed)

## 2015-02-17 NOTE — ED Provider Notes (Signed)
CSN: [id number]     Arrival date & time 02/17/15  2014 History  This chart was scribed for Ronald Fraise, MD by Jeanell Sparrow, ED Scribe. This patient was seen in room [facility]14/[facility]14 and the patient's care was started at 8:56 PM.   Chief Complaint  Patient presents with  . Abdominal Pain   Patient is a 59 y.o. male presenting with abdominal pain. The history is provided by the patient. No language interpreter was used.  Abdominal Pain Pain location:  Generalized Pain quality: pressure   Pain radiates to:  Does not radiate Pain severity:  Moderate Onset quality:  Gradual Duration:  3 weeks Timing:  Constant Progression:  Worsening Chronicity:  New Relieved by:  None tried Worsened by:  Nothing tried Ineffective treatments:  None tried Associated symptoms: no chest pain, no constipation, no diarrhea, no fever, no shortness of breath and no vomiting    HPI Comments: Ronald Cameron is a 59 y.o. male who presents to the Emergency Department complaining of constant moderate generalized abdominal pain that started about 3 weeks ago. He reports that he has been having abdominal pain with bloating for the past 3 weeks. He states that he was sent here from Urgent Care. He reports that he had an ultrasound ordered by his arthritis doctor for "fluid buildup" about a week ago. He describes the pain as a pressure sensation with no modifying factors. He reports as a result of the abdominal bloating he has been having decreased appetite and has not been able to finish meals. He states that also has had a weight gain of 10-15 lbs, and intermittent lightheadedness. He reports having no prior hx of today's symptoms. He denies any fever, vomiting, diarrhea, constipation, chest pain, SOB, leg swelling, or back pain.    Past Medical History  Diagnosis Date  . Arthritis    Past Surgical History  Procedure Laterality Date  . Tonsillectomy    . Elbow surgery     History reviewed. No pertinent family  history. History  Substance Use Topics  . Smoking status: Former Smoker    Types: Cigarettes  . Smokeless tobacco: Not on file  . Alcohol Use: No    Review of Systems  Constitutional: Positive for appetite change and unexpected weight change. Negative for fever.  Respiratory: Negative for shortness of breath.   Cardiovascular: Negative for chest pain and leg swelling.  Gastrointestinal: Positive for abdominal pain and abdominal distention. Negative for vomiting, diarrhea and constipation.  Musculoskeletal: Negative for back pain.  Neurological: Positive for light-headedness.  All other systems reviewed and are negative.    Allergies  Review of patient's allergies indicates no known allergies.  Home Medications   Prior to Admission medications   Medication Sig Start Date End Date Taking? Authorizing Provider  Ascorbic Acid (VITAMIN C) 1000 MG tablet Take 1,000 mg by mouth daily.    Historical Provider, MD  aspirin 81 MG tablet Take 81 mg by mouth daily.    Historical Provider, MD  calcium carbonate (OS-CAL) 600 MG TABS tablet Take 600 mg by mouth daily with breakfast.    Historical Provider, MD  cephALEXin (KEFLEX) 500 MG capsule Take 1 capsule (500 mg total) by mouth 4 (four) times daily. 05/23/14   Lysbeth Penner, FNP  folic acid (FOLVITE) 818 MCG tablet Take 800 mcg by mouth daily.    Historical Provider, MD  methotrexate (RHEUMATREX) 2.5 MG tablet Take 2.5 mg by mouth once a week. Pt takes 10 tablets weekly. Caution:Chemotherapy.  Protect from light.    Historical Provider, MD  predniSONE (DELTASONE) 5 MG tablet Take 5 mg by mouth daily with breakfast.    Historical Provider, MD  Tocilizumab (ACTEMRA) 162 MG/0.9ML SOSY Inject 162 mg into the skin once a week.    Historical Provider, MD  traZODone (DESYREL) 100 MG tablet Take 100 mg by mouth at bedtime as needed for sleep.    Historical Provider, MD   BP 119/83 mmHg  Pulse 70  Temp(Src) 98.8 F (37.1 C)  Resp 20  Ht 5\' 11"   (1.803 m)  Wt 209 lb (94.802 kg)  BMI 29.16 kg/m2  SpO2 99% Physical Exam  Nursing note and vitals reviewed. CONSTITUTIONAL: Well developed/well nourished HEAD: Normocephalic/atraumatic EYES: EOMI/PERRL ENMT: Mucous membranes moist NECK: supple no meningeal signs SPINE/BACK:entire spine nontender CV: S1/S2 noted, no murmurs/rubs/gallops noted LUNGS: Lungs are clear to auscultation bilaterally, no apparent distress ABDOMEN: soft, nontender, no rebound or guarding, bowel sounds noted throughout abdomen, mild distension noted GU:no cva tenderness NEURO: Pt is awake/alert/appropriate, moves all extremitiesx4.  No facial droop.   EXTREMITIES: pulses normal/equal, full ROM, no LE edema is noted SKIN: warm, color normal PSYCH: no abnormalities of mood noted, alert and oriented to situation  ED Course  Procedures  DIAGNOSTIC STUDIES: Oxygen Saturation is 99% on RA, normal by my interpretation.    COORDINATION OF CARE: 9:00 PM- Pt advised of plan for treatment which includes radiology and pt agrees.  Pt well appearing, watching TV, no distress No focal tenderness Low suspicion for acute abdominal obstruction (no vomiting, no tenderness, watching TV and is comfortable) He went to PCP office late and was told to go to "hospital" and then went to urgent care who sent him here for CT scan.  I don't feel emergent CT imaging required as he has no tenderness/no vomiting and this has been ongoing for 3 weeks Referred back to PCP and GI Discussed strict return precautions   Imaging Review Dg Abd Acute W/chest  02/17/2015   CLINICAL DATA:  Generalized abdominal pain starting 3 weeks ago  EXAM: DG ABDOMEN ACUTE W/ 1V CHEST  COMPARISON:  10/18/2003  FINDINGS: Cardiomediastinal silhouette is unremarkable. No acute infiltrate or pleural effusion. No pulmonary edema.  Mild gaseous distended small bowel loops in right abdomen suspicious for mild ileus or enteritis. Some colonic stool noted in right  colon. No free abdominal air.  IMPRESSION: No acute disease within chest. Mild gaseous distended small bowel loops in right abdomen suspicious for mild ileus or enteritis. No small bowel air-fluid levels. No free abdominal air.   Electronically Signed   By: Lahoma Crocker M.D.   On: 02/17/2015 21:55     MDM   Final diagnoses:  Abdominal distention    Nursing notes including past medical history and social history reviewed and considered in documentation xrays/imaging reviewed by myself and considered during evaluation   I personally performed the services described in this documentation, which was scribed in my presence. The recorded information has been reviewed and is accurate.       Ronald Fraise, MD 02/17/15 [phone]

## 2015-02-17 NOTE — ED Notes (Signed)
Pt c/o bloating and generalized abd pressure for 3 weeks, denies N/V/D, LBM this morning and normal per pt

## 2015-02-20 ENCOUNTER — Encounter: Payer: Self-pay | Admitting: Gastroenterology

## 2015-02-20 ENCOUNTER — Ambulatory Visit (INDEPENDENT_AMBULATORY_CARE_PROVIDER_SITE_OTHER): Payer: BLUE CROSS/BLUE SHIELD | Admitting: Gastroenterology

## 2015-02-20 VITALS — BP 132/77 | HR 60 | Temp 98.1°F | Ht 71.0 in | Wt 209.6 lb

## 2015-02-20 DIAGNOSIS — R1907 Generalized intra-abdominal and pelvic swelling, mass and lump: Secondary | ICD-10-CM | POA: Diagnosis not present

## 2015-02-20 DIAGNOSIS — R1013 Epigastric pain: Secondary | ICD-10-CM

## 2015-02-20 DIAGNOSIS — R6881 Early satiety: Secondary | ICD-10-CM | POA: Diagnosis not present

## 2015-02-20 DIAGNOSIS — Z79899 Other long term (current) drug therapy: Secondary | ICD-10-CM | POA: Diagnosis not present

## 2015-02-20 MED ORDER — DEXLANSOPRAZOLE 60 MG PO CPDR: 60.0000 mg | DELAYED_RELEASE_CAPSULE | Freq: Every day | ORAL | Status: DC

## 2015-02-20 NOTE — Patient Instructions (Signed)
1. Start dexalant 60 mg 30 minutes before breakfast daily. Samples provided. Will help with ulcers/gastritis. 2. CT scan of your abdomen as scheduled.  3. I will review copy of labs from Dr. Ouida Sills as soon as available and let you know if further labs are needed.

## 2015-02-20 NOTE — Progress Notes (Signed)
Primary Care Physician:  Redge Gainer, MD  Primary Gastroenterologist:  Garfield Cornea, MD   Chief Complaint  Patient presents with  . Abdominal Pain    More pressure/ bloating very bad    HPI:  Ronald Cameron is a 59 y.o. male here as a new patient, for further evaluation of abdominal swelling, abdominal pain. Patient was advised to see GI after ER visit recently. He reports over the past 3-4 weeks he's had a significant change. He complains of significant abdominal distention, abdomen feels hard, puts pressure on his lungs making it difficult to breathe. Symptoms are worse with meals. Makes him not want to eat. Complains of early satiety. No vomiting. Has some minimal heartburn symptoms for which he takes times. Usually related to certain foods. No dysphagia. Bowel movements are regular usually 2-3 daily, stools are soft, no melena or rectal bleeding. Historically he has had issues with "gas pains". These pains are different from current issues. Usually occurs sporadically, maybe go months in between without symptoms. Pain usually resolves after belching or passing gas. One time had chest pain related to this, negative cardiac workup in the ER.  Recently saw his rheumatologist Dr. Ouida Sills. He was concerned that he may have ascites and sent him for abdominal ultrasound on 02/13/2015 which was negative. Limited visualization of the pancreas. Subsequently he went to an urgent care who referred him to the ER to have a CT scan for distended hard abdomen. In the emergency department, he had acute abdominal series on 02/17/2015 showed mild gaseous distention of the small bowel loops in the right abdomen suspicious for mild ileus or enteritis. No labs obtained.  Patient's medical history significant for severe rheumatoid arthritis, has been on prednisone (low dose) for years, methotrexate, Actemra. Last year he developed staph infection of the right elbow requiring 3 surgeries, parental antibiotics  at home, infectious disease consultation. He was out of work about 6 months. Related to this. He had come off of all of his rheumatoid arthritis medications because of active infection which debilitated him. Reports currently he was advised to come off ofActemra because Dr. Ouida Sills worried about infection in his belly as a cause of his pain and swelling.   His weight is up about 10 pounds in the past one year.   Current Outpatient Prescriptions  Medication Sig Dispense Refill  . Ascorbic Acid (VITAMIN C) 1000 MG tablet Take 1,000 mg by mouth daily.    Marland Kitchen aspirin 81 MG tablet Take 81 mg by mouth daily.    . calcium carbonate (OS-CAL) 600 MG TABS tablet Take 600 mg by mouth daily with breakfast.    . folic acid (FOLVITE) 425 MCG tablet Take 800 mcg by mouth daily.    . methotrexate (RHEUMATREX) 2.5 MG tablet Take 15 mg by mouth every Sunday. Pt takes 10 tablets weekly. Caution:Chemotherapy. Protect from light.    . predniSONE (DELTASONE) 5 MG tablet Take 5 mg by mouth 2 (two) times daily with a meal.     . Tocilizumab (ACTEMRA) 162 MG/0.9ML SOSY Inject 162 mg into the skin once a week.    . traZODone (DESYREL) 100 MG tablet Take 100 mg by mouth at bedtime as needed for sleep.     No current facility-administered medications for this visit.    Allergies as of 02/20/2015  . (No Known Allergies)    Past Medical History  Diagnosis Date  . Rheumatoid arthritis     Past Surgical History  Procedure Laterality Date  .  Tonsillectomy    . Elbow surgery      X 3 last year. Staph infection but not MRSA per patient. infectious disease. out of work 6 months.     Family History  Problem Relation Age of Onset  . Colon cancer Neg Hx   . Liver disease Neg Hx   . Heart attack Father   . Diabetes Mother     History   Social History  . Marital Status: Married    Spouse Name: N/A  . Number of Children: 0  . Years of Education: N/A   Occupational History  . Diesel tech    Social History  Main Topics  . Smoking status: Former Smoker    Types: Cigarettes  . Smokeless tobacco: Not on file     Comment: Vap  . Alcohol Use: No  . Drug Use: No  . Sexual Activity: Not on file   Other Topics Concern  . Not on file   Social History Narrative      ROS:  General: Negative for anorexia, weight loss, fever, chills, fatigue, weakness. Eyes: Negative for vision changes.  ENT: Negative for hoarseness, difficulty swallowing , nasal congestion. CV: Negative for chest pain, angina, palpitations, dyspnea on exertion, peripheral edema.  Respiratory: Negative for dyspnea at rest, dyspnea on exertion, cough, sputum, wheezing.  GI: See history of present illness. GU:  Negative for dysuria, hematuria, urinary incontinence, urinary frequency, nocturnal urination.  MS: Multiple joint pain, rheumatoid arthritis.  Derm: Negative for rash or itching.  Neuro: Negative for weakness, abnormal sensation, seizure, frequent headaches, memory loss, confusion.  Psych: Negative for anxiety, depression, suicidal ideation, hallucinations.  Endo: Negative for unusual weight change.  Heme: Negative for bruising or bleeding. Allergy: Negative for rash or hives.    Physical Examination:  BP 132/77 mmHg  Pulse 60  Temp(Src) 98.1 F (36.7 C) (Oral)  Ht 5\' 11"  (1.803 m)  Wt 209 lb 9.6 oz (95.074 kg)  BMI 29.25 kg/m2   General: Well-nourished, well-developed in no acute distress.  Head: Normocephalic, atraumatic.   Eyes: Conjunctiva pink, no icterus. Mouth: Oropharyngeal mucosa moist and pink , no lesions erythema or exudate. Neck: Supple without thyromegaly, masses, or lymphadenopathy.  Lungs: Clear to auscultation bilaterally.  Heart: Regular rate and rhythm, no murmurs rubs or gallops.  Abdomen: Bowel sounds are normal, moderate epigastric tenderness, slightly distended, no hepatosplenomegaly or masses, no abdominal bruits or    hernia , no rebound or guarding.   Rectal: Not  performed Extremities: No lower extremity edema. No clubbing or deformities.  Neuro: Alert and oriented x 4 , grossly normal neurologically.  Skin: Warm and dry, no rash or jaundice.   Psych: Alert and cooperative, normal mood and affect.  Labs: Requested recent labs from Dr. Ouida Sills  Imaging Studies: US Abdomen Complete  02/13/2015   CLINICAL DATA:  Abdominal distention.  EXAM: ULTRASOUND ABDOMEN COMPLETE  COMPARISON:  None.  FINDINGS: Gallbladder: No gallstones or wall thickening visualized. No sonographic Murphy sign noted.  Common bile duct: Diameter: 2.8 mm, normal.  Liver: No focal lesion identified. Within normal limits in parenchymal echogenicity.  IVC: Not visualized.  Pancreas: Visualized portion unremarkable. Only a small portion of the midbody is visible.  Spleen: Size and appearance within normal limits.  Right Kidney: Length: 10.8 cm. Echogenicity within normal limits. No mass or hydronephrosis visualized.  Left Kidney: Length: 11.0 cm. Echogenicity within normal limits. No mass or hydronephrosis visualized.  Abdominal aorta: No aneurysm visualized. 2.0 cm. The mid  abdominal aorta and iliac arteries are obscured by bowel.  Other findings: No visible ascites or other abnormalities.  IMPRESSION: Negative abdominal ultrasound. Limited visualization of the pancreas.   Electronically Signed   By: Lorriane Shire M.D.   On: 02/13/2015 10:07   Dg Abd Acute W/chest  02/17/2015   CLINICAL DATA:  Generalized abdominal pain starting 3 weeks ago  EXAM: DG ABDOMEN ACUTE W/ 1V CHEST  COMPARISON:  10/18/2003  FINDINGS: Cardiomediastinal silhouette is unremarkable. No acute infiltrate or pleural effusion. No pulmonary edema.  Mild gaseous distended small bowel loops in right abdomen suspicious for mild ileus or enteritis. Some colonic stool noted in right colon. No free abdominal air.  IMPRESSION: No acute disease within chest. Mild gaseous distended small bowel loops in right abdomen suspicious for mild  ileus or enteritis. No small bowel air-fluid levels. No free abdominal air.   Electronically Signed   By: Lahoma Crocker M.D.   On: 02/17/2015 21:55

## 2015-02-20 NOTE — Assessment & Plan Note (Signed)
59 year old gentleman presents with 3-4 week history of abdominal swelling/distention, epigastric pain, early satiety in the setting of high-risk medication use for rheumatoid arthritis. Dr. Ouida Sills has stopped some of his medications until workup has been completed. Last year he had a significant issue with Staphylococcus aureus although he states it was "one step below MRSA". He is on prednisone and aspirin chronically. He is at increased risk for gastritis, peptic ulcer disease which may explain early satiety and epigastric discomfort. However this really would not explain abdominal distention/swelling. There was no evidence of ascites on ultrasound. He had abdominal series which showed some distention of the small bowel loops in the right abdomen possible mild ileus versus enteritis. Patient denies diarrhea. His obstructions.  Given the significance of his symptoms, discussed need for CT of the abdomen and pelvis with contrast to rule out obstruction as well as to evaluate his pancreas which was not adequately seen on ultrasound. Need to exclude underlying malignancy, any evidence of infection. If CT negative would consider EGD and colonoscopy as an stat rule out ulcer and screening for colon cancer.  I have requested labs from Dr. Ouida Sills for review. If further are needed will let patient know. In the meantime we did start Dexilant 60 mg daily before breakfast, samples provided.

## 2015-03-02 ENCOUNTER — Ambulatory Visit (HOSPITAL_COMMUNITY)
Admission: RE | Admit: 2015-03-02 | Discharge: 2015-03-02 | Disposition: A | Discharge status: Home / Self Care | Payer: BLUE CROSS/BLUE SHIELD | Source: Ambulatory Visit | Attending: Gastroenterology | Admitting: Gastroenterology

## 2015-03-02 DIAGNOSIS — I2510 Atherosclerotic heart disease of native coronary artery without angina pectoris: Secondary | ICD-10-CM | POA: Diagnosis not present

## 2015-03-02 DIAGNOSIS — R1013 Epigastric pain: Secondary | ICD-10-CM | POA: Diagnosis present

## 2015-03-02 DIAGNOSIS — R1907 Generalized intra-abdominal and pelvic swelling, mass and lump: Secondary | ICD-10-CM

## 2015-03-02 DIAGNOSIS — R933 Abnormal findings on diagnostic imaging of other parts of digestive tract: Secondary | ICD-10-CM | POA: Insufficient documentation

## 2015-03-02 DIAGNOSIS — Z79899 Other long term (current) drug therapy: Secondary | ICD-10-CM

## 2015-03-02 DIAGNOSIS — R1900 Intra-abdominal and pelvic swelling, mass and lump, unspecified site: Secondary | ICD-10-CM | POA: Insufficient documentation

## 2015-03-02 DIAGNOSIS — K5730 Diverticulosis of large intestine without perforation or abscess without bleeding: Secondary | ICD-10-CM | POA: Insufficient documentation

## 2015-03-02 DIAGNOSIS — R6881 Early satiety: Secondary | ICD-10-CM

## 2015-03-02 DIAGNOSIS — K760 Fatty (change of) liver, not elsewhere classified: Secondary | ICD-10-CM | POA: Insufficient documentation

## 2015-03-02 MED ORDER — IOHEXOL 300 MG/ML  SOLN
100.0000 mL | Freq: Once | INTRAMUSCULAR | Status: AC | PRN
  Administered 2015-03-02: 100 mL via INTRAVENOUS

## 2015-03-03 ENCOUNTER — Other Ambulatory Visit: Payer: Self-pay | Admitting: Gastroenterology

## 2015-03-03 MED ORDER — METRONIDAZOLE 500 MG PO TABS: 500.0000 mg | ORAL_TABLET | Freq: Three times a day (TID) | ORAL | Status: DC

## 2015-03-03 MED ORDER — CIPROFLOXACIN HCL 500 MG PO TABS: 500.0000 mg | ORAL_TABLET | Freq: Two times a day (BID) | ORAL | Status: DC

## 2015-03-03 MED ORDER — DEXLANSOPRAZOLE 60 MG PO CPDR: 60.0000 mg | DELAYED_RELEASE_CAPSULE | Freq: Every day | ORAL | Status: DC

## 2015-03-03 NOTE — Progress Notes (Signed)
Quick Note:  Please let patient know results: NO obstruction or fluid (ascites).  Small fat containing left inguinal hernia. Not causing any problems. Coronary artery calcifications and ?mildly enlarged heart on CT.  Mild fatty liver. Some sigmoid diverticulosis with mild wall thickening, cannot exclude diverticulitis.  Small left renal lesion, likely a small benign cyst.  #1 Treat with Cipro/Flagyl for diverticulitis. Likely does not explain all of his GI symptoms therefore recommend he continue new medication started at office visit, Dexilant (samples given). RX will be sent in too. #2 See PCP about Coronary artery calcifications and ?enlarged heart. Likely will need cardiology referral. Send copy of CT to PCP. #3 Instructions for fatty liver: Recommend 1-2# weight loss per week until ideal body weight through exercise & diet. Low fat/cholesterol diet.  Avoid sweets, sodas, fruit juices, sweetened beverages like tea, etc. Gradually increase exercise from 15 min daily up to 1 hr per day 5 days/week. Limit alcohol use. #4 Renal U/S in 6 months to follow up on ?renal cyst? #5 OV in 4 weeks for follow up. Based on symptoms may set up for EGD and colonoscopy at that time. ______

## 2015-03-03 NOTE — Progress Notes (Signed)
Quick Note:  Per the patient he would like [place] to send his ct scan to his "arthitis doctor" Dr. Tobie Lords with [facility]. Almyra Free routed ct scan to Dr. Tobie Lords in-basket in [place]. ______

## 2015-03-03 NOTE — Progress Notes (Signed)
Quick Note:  Recall made and pt is scheduled to see LL on 7/11 at 830 and he is aware of this appt ______

## 2015-03-06 ENCOUNTER — Telehealth: Payer: Self-pay

## 2015-03-06 NOTE — Telephone Encounter (Signed)
Pt is calling because his PCP told him that they did not get the CT report. They told him that we would have to be the one to send him to the Cardiology. He is just wanting to be seen since he could have a ? Enlarged heart. Can we send the referral for him. Please advise

## 2015-03-06 NOTE — Telephone Encounter (Signed)
Refer to cardiology for Coronary artery calcifications and ?mildly enlarged heart on CT.

## 2015-03-09 ENCOUNTER — Other Ambulatory Visit: Payer: Self-pay

## 2015-03-09 ENCOUNTER — Telehealth: Payer: Self-pay | Admitting: Internal Medicine

## 2015-03-09 DIAGNOSIS — I6529 Occlusion and stenosis of unspecified carotid artery: Secondary | ICD-10-CM

## 2015-03-09 NOTE — Telephone Encounter (Signed)
Referral has been made.

## 2015-03-09 NOTE — Telephone Encounter (Signed)
I requested the recent labs from Dr Ouida Sills on this patient on 02/20/15. Did you get them? Checking to see if I need to request them again or not.

## 2015-03-11 NOTE — Progress Notes (Signed)
cc'd to pcp 

## 2015-03-12 ENCOUNTER — Telehealth: Payer: Self-pay

## 2015-03-12 NOTE — Telephone Encounter (Signed)
I requested them again today.

## 2015-03-12 NOTE — Telephone Encounter (Signed)
Mailed letter to the pt. 

## 2015-03-12 NOTE — Telephone Encounter (Signed)
Have not received anything yet.

## 2015-03-12 NOTE — Telephone Encounter (Signed)
Hold on Dexilant. Can discuss at upcoming [place] with [place].

## 2015-03-12 NOTE — Telephone Encounter (Signed)
I called the pt because I had received a PA request for the dexilant. I could not find any prior ppi's taken by the pt. He said he has never taken anything other than tums. Pt also said the samples of dexilant didn't do anything and he couldn't tell any difference when he took them. The pt also stated he wasn't having any problems and didn't think he needed to take anything. Do you want to switch him to something else?   He also said he got a call from cardiology and his appt with them is 03/27/15.

## 2015-03-27 ENCOUNTER — Encounter: Payer: Self-pay | Admitting: Cardiology

## 2015-03-27 ENCOUNTER — Ambulatory Visit (INDEPENDENT_AMBULATORY_CARE_PROVIDER_SITE_OTHER): Payer: BLUE CROSS/BLUE SHIELD | Admitting: Cardiology

## 2015-03-27 VITALS — BP 114/74 | HR 68 | Ht 71.0 in | Wt 203.0 lb

## 2015-03-27 DIAGNOSIS — R0602 Shortness of breath: Secondary | ICD-10-CM

## 2015-03-27 DIAGNOSIS — R0989 Other specified symptoms and signs involving the circulatory and respiratory systems: Secondary | ICD-10-CM | POA: Diagnosis not present

## 2015-03-27 DIAGNOSIS — I2510 Atherosclerotic heart disease of native coronary artery without angina pectoris: Secondary | ICD-10-CM

## 2015-03-27 DIAGNOSIS — Z136 Encounter for screening for cardiovascular disorders: Secondary | ICD-10-CM

## 2015-03-27 DIAGNOSIS — E785 Hyperlipidemia, unspecified: Secondary | ICD-10-CM

## 2015-03-27 NOTE — Progress Notes (Signed)
Clinical Summary [name] is a 59 y.o.male seen today as a new patient for the following medical problems.   1. Coronary calcification - noted incidentally on CT scan/ possible enlarged heart  - occasional epigastic pain better with burping. No exertional chest pain - no significant DOE. No orthopnea,   CAD risk factors: tobacco, unsure of HL or DM, sister MI early 3s, sister MI late 79s, father MI 49s, mother carotid artery disease with prior stents. Brother with PAD bilateral lower extremities. Hx of rheumatoid arthtiris.       Past Medical History  Diagnosis Date  . Rheumatoid arthritis      No Known Allergies   Current Outpatient Prescriptions  Medication Sig Dispense Refill  . Ascorbic Acid (VITAMIN C) 1000 MG tablet Take 1,000 mg by mouth daily.    Marland Kitchen aspirin 81 MG tablet Take 81 mg by mouth daily.    . calcium carbonate (OS-CAL) 600 MG TABS tablet Take 600 mg by mouth daily with breakfast.    . ciprofloxacin (CIPRO) 500 MG tablet Take 1 tablet (500 mg total) by mouth 2 (two) times daily. 28 tablet 0  . dexlansoprazole (DEXILANT) 60 MG capsule Take 1 capsule (60 mg total) by mouth daily before breakfast. 30 capsule 3  . folic acid (FOLVITE) 476 MCG tablet Take 800 mcg by mouth daily.    . methotrexate (RHEUMATREX) 2.5 MG tablet Take 15 mg by mouth every Sunday. Pt takes 10 tablets weekly. Caution:Chemotherapy. Protect from light.    . metroNIDAZOLE (FLAGYL) 500 MG tablet Take 1 tablet (500 mg total) by mouth 3 (three) times daily. 42 tablet 0  . predniSONE (DELTASONE) 5 MG tablet Take 5 mg by mouth 2 (two) times daily with a meal.     . Tocilizumab (ACTEMRA) 162 MG/0.9ML SOSY Inject 162 mg into the skin once a week.    . traZODone (DESYREL) 100 MG tablet Take 100 mg by mouth at bedtime as needed for sleep.     No current facility-administered medications for this visit.     Past Surgical History  Procedure Laterality Date  . Tonsillectomy    . Elbow  surgery      X 3 last year. Staph infection but not MRSA per patient. infectious disease. out of work 6 months.      No Known Allergies    Family History  Problem Relation Age of Onset  . Colon cancer Neg Hx   . Liver disease Neg Hx   . Heart attack Father   . Diabetes Mother      Social History [name] reports that he has quit smoking. His smoking use included Cigarettes. He does not have any smokeless tobacco history on file. [name] reports that he does not drink alcohol.   Review of Systems CONSTITUTIONAL: No weight loss, fever, chills, weakness or fatigue.  HEENT: Eyes: No visual loss, blurred vision, double vision or yellow sclerae.No hearing loss, sneezing, congestion, runny nose or sore throat.  SKIN: No rash or itching.  CARDIOVASCULAR: per HPI RESPIRATORY: No shortness of breath, cough or sputum.  GASTROINTESTINAL: No anorexia, nausea, vomiting or diarrhea. No abdominal pain or blood.  GENITOURINARY: No burning on urination, no polyuria NEUROLOGICAL: No headache, dizziness, syncope, paralysis, ataxia, numbness or tingling in the extremities. No change in bowel or bladder control.  MUSCULOSKELETAL: No muscle, back pain, joint pain or stiffness.  LYMPHATICS: No enlarged nodes. No history of splenectomy.  PSYCHIATRIC: No history of depression or anxiety.  ENDOCRINOLOGIC: No reports of sweating, cold or heat intolerance. No polyuria or polydipsia.  Marland Kitchen   Physical Examination Filed Vitals:   03/27/15 1435  BP: 114/74  Pulse: 68   Filed Vitals:   03/27/15 1435  Height: 5\' 11"  (1.803 m)  Weight: 203 lb (92.08 kg)    Gen: resting comfortably, no acute distress HEENT: no scleral icterus, pupils equal round and reactive, no palptable cervical adenopathy,  CV: RRR, no m/r/g, no JVD, Left carotid bruit Resp: Clear to auscultation bilaterally GI: abdomen is soft, non-tender, non-distended, normal bowel sounds, no hepatosplenomegaly MSK: extremities are  warm, no edema.  Skin: warm, no rash Neuro:  no focal deficits Psych: appropriate affect   Diagnostic Studies EKG: sr, RAD, possible anteroseptal old infarct, poor R wave progress    Assessment and Plan   1. Coronary calcification/CAD - incidental findings on CT scan - denies any significant symptoms - EKG suggests possible old anteroseptal infarct, poor R wave progression - significant CAD risk factors - will obtain echo, pending results will likely need functional assessment of his CAD with exercise cardiolite.  - check HgbA1c, FLP  2. Carotid bruit - check carotid US   F/u 4 weeks  Arnoldo Lenis, M.D.

## 2015-03-27 NOTE — Patient Instructions (Signed)
Your physician recommends that you schedule a follow-up appointment in: 1 month with Dr Harl Bowie    Your physician has requested that you have an echocardiogram. Echocardiography is a painless test that uses sound waves to create images of your heart. It provides your doctor with information about the size and shape of your heart and how well your heart's chambers and valves are working. This procedure takes approximately one hour. There are no restrictions for this procedure.  Your physician has requested that you have a carotid duplex. This test is an ultrasound of the carotid arteries in your neck. It looks at blood flow through these arteries that supply the brain with blood. Allow one hour for this exam. There are no restrictions or special instructions.    FASTING lab work :lipid,LFT's,A1C    Thank you for [place] !

## 2015-03-28 LAB — HEPATIC FUNCTION PANEL
ALT: 20 U/L (ref 0–53)
AST: 14 U/L (ref 0–37)
Albumin: 3.3 g/dL — ABNORMAL LOW (ref 3.5–5.2)
Alkaline Phosphatase: 38 U/L — ABNORMAL LOW (ref 39–117)
BILIRUBIN DIRECT: 0.1 mg/dL (ref 0.0–0.3)
Indirect Bilirubin: 0.4 mg/dL (ref 0.2–1.2)
TOTAL PROTEIN: 6.4 g/dL (ref 6.0–8.3)
Total Bilirubin: 0.5 mg/dL (ref 0.2–1.2)

## 2015-03-28 LAB — LIPID PANEL
CHOL/HDL RATIO: 5.6 ratio
Cholesterol: 228 mg/dL — ABNORMAL HIGH (ref 0–200)
HDL: 41 mg/dL (ref >=40)
LDL CALC: 177 mg/dL — AB (ref 0–99)
Triglycerides: 49 mg/dL (ref <150)
VLDL: 10 mg/dL (ref 0–40)

## 2015-03-29 LAB — HEMOGLOBIN A1C
Hgb A1c MFr Bld: 5.4 % (ref <5.7)
MEAN PLASMA GLUCOSE: 108 mg/dL (ref <117)

## 2015-03-31 ENCOUNTER — Telehealth: Payer: Self-pay

## 2015-03-31 MED ORDER — ATORVASTATIN CALCIUM 40 MG PO TABS: 40.0000 mg | ORAL_TABLET | Freq: Every day | ORAL | Status: DC

## 2015-03-31 NOTE — Telephone Encounter (Signed)
-----   Message from Arnoldo Lenis, MD sent at 03/31/2015 10:54 AM EDT ----- Labs show cholesterol is too high, please start on atorvastatin 40mg  daily  Zandra Abts MD

## 2015-03-31 NOTE — Telephone Encounter (Signed)
Pt notified of lab results,e-scibed lipitor 40 mg daily to pharmacy

## 2015-04-03 ENCOUNTER — Ambulatory Visit (HOSPITAL_COMMUNITY)
Admission: RE | Admit: 2015-04-03 | Discharge: 2015-04-03 | Disposition: A | Discharge status: Home / Self Care | Payer: BLUE CROSS/BLUE SHIELD | Source: Ambulatory Visit | Attending: Cardiology | Admitting: Cardiology

## 2015-04-03 ENCOUNTER — Telehealth: Payer: Self-pay

## 2015-04-03 DIAGNOSIS — R0602 Shortness of breath: Secondary | ICD-10-CM | POA: Diagnosis not present

## 2015-04-03 DIAGNOSIS — I6523 Occlusion and stenosis of bilateral carotid arteries: Secondary | ICD-10-CM | POA: Insufficient documentation

## 2015-04-03 DIAGNOSIS — I083 Combined rheumatic disorders of mitral, aortic and tricuspid valves: Secondary | ICD-10-CM | POA: Insufficient documentation

## 2015-04-03 DIAGNOSIS — I2510 Atherosclerotic heart disease of native coronary artery without angina pectoris: Secondary | ICD-10-CM

## 2015-04-03 DIAGNOSIS — R0989 Other specified symptoms and signs involving the circulatory and respiratory systems: Secondary | ICD-10-CM

## 2015-04-03 NOTE — Telephone Encounter (Signed)
-----   Message from Arnoldo Lenis, MD sent at 04/03/2015  2:36 PM EDT ----- Echo looks good. PLease order exercise cardiolite for CAD. Does not need to hold any meds   Zandra Abts MD

## 2015-04-03 NOTE — Telephone Encounter (Signed)
Order placed will call pt Monday am 04/06/15 to schedule

## 2015-04-06 ENCOUNTER — Ambulatory Visit (INDEPENDENT_AMBULATORY_CARE_PROVIDER_SITE_OTHER): Payer: BLUE CROSS/BLUE SHIELD | Admitting: Gastroenterology

## 2015-04-06 ENCOUNTER — Encounter: Payer: Self-pay | Admitting: Gastroenterology

## 2015-04-06 VITALS — BP 127/77 | HR 61 | Temp 98.7°F | Ht 71.0 in | Wt 210.4 lb

## 2015-04-06 DIAGNOSIS — K760 Fatty (change of) liver, not elsewhere classified: Secondary | ICD-10-CM

## 2015-04-06 DIAGNOSIS — K5732 Diverticulitis of large intestine without perforation or abscess without bleeding: Secondary | ICD-10-CM | POA: Diagnosis not present

## 2015-04-06 DIAGNOSIS — K529 Noninfective gastroenteritis and colitis, unspecified: Secondary | ICD-10-CM | POA: Insufficient documentation

## 2015-04-06 NOTE — Assessment & Plan Note (Signed)
Recent ?mild diverticulitis per CT findings. Patient was having abdominal distention/bloating associated with upper abdominal discomfort. Treated with Cipro/Flagyl and states symptoms completely resolved. Did not agree to trial of PPI for upper symptoms but they too have resolved.   Offered colonoscopy (first ever) but patient declined at this time. See above. He will call when he is ready to schedule.

## 2015-04-06 NOTE — Progress Notes (Signed)
Primary Care Physician: Redge Gainer, MD  Primary Gastroenterologist:  Garfield Cornea, MD   Chief Complaint  Patient presents with  . Follow-up    doing good    HPI: Ronald Cameron is a 59 y.o. male here for follow-up. He was seen back in May as a new patient referral for abdominal swelling and abdominal pain. At that time of his last office visit he complained of 4 week history of significant abdominal distention making it hard to breathe. Had to go to the ER. Associated with early satiety and anorexia. Minimal heartburn symptoms for which he takes TUMS. His rheumatologist, Dr. Ouida Sills was concerned about ascites and ordered an ultrasound which was negative. He has rheumatoid arthritis and is on low-dose prednisone, methotrexate, Actemra.   CT since last OV with following results: NO obstruction or fluid (ascites).  Small fat containing left inguinal hernia. Not causing any problems. Coronary artery calcifications and ?mildly enlarged heart on CT.  Mild fatty liver. Some sigmoid diverticulosis with mild wall thickening, cannot exclude diverticulitis.  Small left renal lesion, likely a small benign cyst.  He completed Cipro/Flagyl for likely divertiiculitis. States his symptoms have resolved. no further bloating or abdominal discomfort. BM regular. No melena, brbpr. No heartburn, vomiting. Feels good except his RA. Advised to stop Actemra by rheumatologist because it can "cause infection in my colon".   Patient has been offered colonoscopy in the past and currently. Has declined because wants "screening colonoscopy" as opposed to a diagnostic due to insurance issues/coverage. Wants to wait until he is not having any problems and can be truly classified as screening. Explained that if polyps found then insurance likely change to diagnostic anyway. Voiced understanding. Not interested in scheduling yet.   Patient undergoing cardiology work up due to CT findings of mild CM and  coronary calcifications.   Current Outpatient Prescriptions  Medication Sig Dispense Refill  . Ascorbic Acid (VITAMIN C) 1000 MG tablet Take 1,000 mg by mouth daily.    Marland Kitchen aspirin 81 MG tablet Take 81 mg by mouth daily.    Marland Kitchen atorvastatin (LIPITOR) 40 MG tablet Take 1 tablet (40 mg total) by mouth daily at 6 PM. 90 tablet 3  . calcium carbonate (OS-CAL) 600 MG TABS tablet Take 600 mg by mouth daily with breakfast.    . folic acid (FOLVITE) 382 MCG tablet Take 800 mcg by mouth daily.    . methotrexate (RHEUMATREX) 2.5 MG tablet Take 15 mg by mouth every Sunday. Pt takes 10 tablets weekly. Caution:Chemotherapy. Protect from light.    . predniSONE (DELTASONE) 5 MG tablet Take 5 mg by mouth 2 (two) times daily with a meal.     . traZODone (DESYREL) 100 MG tablet Take 100 mg by mouth at bedtime as needed for sleep.     No current facility-administered medications for this visit.    Allergies as of 04/06/2015  . (No Known Allergies)   Past Medical History  Diagnosis Date  . Rheumatoid arthritis    Past Surgical History  Procedure Laterality Date  . Tonsillectomy    . Elbow surgery      X 3 last year. Staph infection but not MRSA per patient. infectious disease. out of work 6 months.     ROS:  General: Negative for anorexia, weight loss, fever, chills, fatigue, weakness. ENT: Negative for hoarseness, difficulty swallowing , nasal congestion. CV: Negative for chest pain, angina, palpitations, dyspnea on exertion, peripheral edema.  Respiratory: Negative for dyspnea  at rest, dyspnea on exertion, cough, sputum, wheezing.  GI: See history of present illness. GU:  Negative for dysuria, hematuria, urinary incontinence, urinary frequency, nocturnal urination.  Endo: Negative for unusual weight change.    Physical Examination:   BP 127/77 mmHg  Pulse 61  Temp(Src) 98.7 F (37.1 C) (Oral)  Ht 5\' 11"  (1.803 m)  Wt 210 lb 6.4 oz (95.437 kg)  BMI 29.36 kg/m2  General: Well-nourished,  well-developed in no acute distress.  Eyes: No icterus. Mouth: Oropharyngeal mucosa moist and pink , no lesions erythema or exudate. Lungs: Clear to auscultation bilaterally.  Heart: Regular rate and rhythm, no murmurs rubs or gallops.  Abdomen: Bowel sounds are normal, nontender, nondistended, no hepatosplenomegaly or masses, no abdominal bruits or hernia , no rebound or guarding.   Extremities: No lower extremity edema. No clubbing or deformities. Neuro: Alert and oriented x 4   Skin: Warm and dry, no jaundice.   Psych: Alert and cooperative, normal mood and affect.  Labs:  Lab Results  Component Value Date   ALT 20 03/27/2015   AST 14 03/27/2015   ALKPHOS 38* 03/27/2015   BILITOT 0.5 03/27/2015     Imaging Studies: US Carotid Duplex Bilateral  04/03/2015   CLINICAL DATA:  Asymptomatic bilateral carotid bruits  EXAM: BILATERAL CAROTID DUPLEX ULTRASOUND  TECHNIQUE: Pearline Cables scale imaging, color Doppler and duplex ultrasound were performed of bilateral carotid and vertebral arteries in the neck.  COMPARISON:  None.  FINDINGS: Criteria: Quantification of carotid stenosis is based on velocity parameters that correlate the residual internal carotid diameter with NASCET-based stenosis levels, using the diameter of the distal internal carotid lumen as the denominator for stenosis measurement.  The following velocity measurements were obtained:  RIGHT  ICA:  63/23 cm/sec  CCA:  893/81 cm/sec  SYSTOLIC ICA/CCA RATIO:  0.6  DIASTOLIC ICA/CCA RATIO:  1.1  ECA:  96 cm/sec  LEFT  ICA:  78/20 cm/sec  CCA:  01/75 cm/sec  SYSTOLIC ICA/CCA RATIO:  0.8  DIASTOLIC ICA/CCA RATIO:  1.1  ECA:  137 cm/sec  RIGHT CAROTID ARTERY: Minor echogenic shadowing plaque formation. No hemodynamically significant right ICA stenosis, velocity elevation, or turbulent flow. Degree of narrowing less than 50%.  RIGHT VERTEBRAL ARTERY:  Antegrade  LEFT CAROTID ARTERY: Similar scattered minor echogenic plaque formation. No  hemodynamically significant left ICA stenosis, velocity elevation, or turbulent flow.  LEFT VERTEBRAL ARTERY:  Antegrade  IMPRESSION: Minor carotid atherosclerosis. No hemodynamically significant ICA stenosis by ultrasound. Degree of narrowing less than 50% bilaterally.   Electronically Signed   By: Jerilynn Mages.  Shick M.D.   On: 04/03/2015 13:14

## 2015-04-06 NOTE — Patient Instructions (Signed)
1. Call when you are ready to schedule routine colonoscopy.  2. We will remind you to have renal ultrasound in six months to follow up on cyst seen at time of CT scan.  Instructions for fatty liver: Recommend 1-2# weight loss per week until ideal body weight through exercise & diet. Low fat/cholesterol diet.   Avoid sweets, sodas, fruit juices, sweetened beverages like tea, etc. Gradually increase exercise from 15 min daily up to 1 hr per day 5 days/week. Limit alcohol use.

## 2015-04-06 NOTE — Assessment & Plan Note (Signed)
Fatty liver with normal LFTs. High risk medication.  Instructions for fatty liver: Recommend 1-2# weight loss per week until ideal body weight through exercise & diet. Low fat/cholesterol diet.   Avoid sweets, sodas, fruit juices, sweetened beverages like tea, etc. Gradually increase exercise from 15 min daily up to 1 hr per day 5 days/week. Limit alcohol use.  LFTs every six months. OV in one year.

## 2015-04-09 ENCOUNTER — Other Ambulatory Visit: Payer: Self-pay | Admitting: Gastroenterology

## 2015-04-09 ENCOUNTER — Telehealth: Payer: Self-pay

## 2015-04-09 DIAGNOSIS — K760 Fatty (change of) liver, not elsewhere classified: Secondary | ICD-10-CM

## 2015-04-09 NOTE — Telephone Encounter (Signed)
Lab orders on file. 

## 2015-04-09 NOTE — Telephone Encounter (Signed)
-----   Message from Mahala Menghini, PA-C sent at 04/06/2015  8:34 PM EDT ----- Needs LFTs in six months.

## 2015-04-10 NOTE — Progress Notes (Signed)
CC'ED TO PCP 

## 2015-04-14 ENCOUNTER — Encounter (HOSPITAL_COMMUNITY): Payer: BLUE CROSS/BLUE SHIELD

## 2015-04-15 ENCOUNTER — Encounter (HOSPITAL_COMMUNITY)
Admission: RE | Admit: 2015-04-15 | Discharge: 2015-04-15 | Disposition: A | Discharge status: Home / Self Care | Payer: BLUE CROSS/BLUE SHIELD | Source: Ambulatory Visit | Attending: Cardiology | Admitting: Cardiology

## 2015-04-15 ENCOUNTER — Encounter (HOSPITAL_COMMUNITY): Payer: Self-pay

## 2015-04-15 ENCOUNTER — Ambulatory Visit (HOSPITAL_COMMUNITY): Admission: RE | Admit: 2015-04-15 | Payer: BLUE CROSS/BLUE SHIELD | Source: Ambulatory Visit

## 2015-04-15 DIAGNOSIS — I2510 Atherosclerotic heart disease of native coronary artery without angina pectoris: Secondary | ICD-10-CM

## 2015-04-15 HISTORY — DX: Systemic involvement of connective tissue, unspecified: M35.9

## 2015-04-15 LAB — NM MYOCAR MULTI W/SPECT W/WALL MOTION / EF
CHL CUP NUCLEAR SDS: 1
CHL RATE OF PERCEIVED EXERTION: 13
Estimated workload: 7 METS
Exercise duration (min): 5 min
Exercise duration (sec): 50 s
LV dias vol: 100 mL
LVSYSVOL: 30 mL
MPHR: 164 {beats}/min
Peak HR: 131 {beats}/min
Percent HR: 79 %
Rest HR: 52 {beats}/min
SRS: 2
SSS: 3
TID: 1

## 2015-04-15 MED ORDER — TECHNETIUM TC 99M SESTAMIBI - CARDIOLITE
10.0000 | Freq: Once | INTRAVENOUS | Status: AC | PRN
  Administered 2015-04-15: 07:00:00 9 via INTRAVENOUS

## 2015-04-15 MED ORDER — TECHNETIUM TC 99M SESTAMIBI GENERIC - CARDIOLITE
30.0000 | Freq: Once | INTRAVENOUS | Status: AC | PRN
  Administered 2015-04-15: 33 via INTRAVENOUS

## 2015-04-15 MED ORDER — REGADENOSON 0.4 MG/5ML IV SOLN
INTRAVENOUS | Status: AC
  Administered 2015-04-15: 0.4 mg
  Filled 2015-04-15: qty 5

## 2015-05-07 ENCOUNTER — Ambulatory Visit: Payer: BLUE CROSS/BLUE SHIELD | Admitting: Cardiology

## 2015-05-07 ENCOUNTER — Ambulatory Visit (INDEPENDENT_AMBULATORY_CARE_PROVIDER_SITE_OTHER): Payer: BLUE CROSS/BLUE SHIELD | Admitting: Cardiology

## 2015-05-07 ENCOUNTER — Encounter: Payer: Self-pay | Admitting: Cardiology

## 2015-05-07 VITALS — BP 120/73 | HR 76 | Ht 71.0 in | Wt 214.0 lb

## 2015-05-07 DIAGNOSIS — E785 Hyperlipidemia, unspecified: Secondary | ICD-10-CM | POA: Diagnosis not present

## 2015-05-07 DIAGNOSIS — I2510 Atherosclerotic heart disease of native coronary artery without angina pectoris: Secondary | ICD-10-CM | POA: Diagnosis not present

## 2015-05-07 NOTE — Progress Notes (Signed)
Patient ID: Ronald Cameron, male   DOB: 1956/07/31, 59 y.o.   MRN: [id number]     Clinical Summary Ronald Cameron is a 59 y.o.male seen today for follow up of the following medical problems.   1. Coronary calcification - noted incidentally on CT scan/ possible enlarged heart  - occasional epigastic pain better with burping. No exertional chest pain - no significant DOE. No orthopnea,  - CAD risk factors: tobacco, unsure of HL or DM, sister MI early 77s, sister MI late 70s, father MI 20s, mother carotid artery disease with prior stents. Brother with PAD bilateral lower extremities. Hx of rheumatoid arthtiris.    - since last visit completed echo and Lexiscan which showed no significant cardiac disease. He denies any new symptoms since last visit.  Past Medical History  Diagnosis Date  . Rheumatoid arthritis   . Collagen vascular disease     Rheumatoid Arthritis     No Known Allergies   Current Outpatient Prescriptions  Medication Sig Dispense Refill  . Ascorbic Acid (VITAMIN C) 1000 MG tablet Take 1,000 mg by mouth daily.    Marland Kitchen aspirin 81 MG tablet Take 81 mg by mouth daily.    Marland Kitchen atorvastatin (LIPITOR) 40 MG tablet Take 1 tablet (40 mg total) by mouth daily at 6 PM. 90 tablet 3  . calcium carbonate (OS-CAL) 600 MG TABS tablet Take 600 mg by mouth daily with breakfast.    . folic acid (FOLVITE) 841 MCG tablet Take 800 mcg by mouth daily.    . methotrexate (RHEUMATREX) 2.5 MG tablet Take 15 mg by mouth every Sunday. Pt takes 10 tablets weekly. Caution:Chemotherapy. Protect from light.    . predniSONE (DELTASONE) 5 MG tablet Take 5 mg by mouth 2 (two) times daily with a meal.     . traZODone (DESYREL) 100 MG tablet Take 100 mg by mouth at bedtime as needed for sleep.     No current facility-administered medications for this visit.     Past Surgical History  Procedure Laterality Date  . Tonsillectomy    . Elbow surgery      X 3 last year. Staph infection but not MRSA per  patient. infectious disease. out of work 6 months.      No Known Allergies    Family History  Problem Relation Age of Onset  . Colon cancer Neg Hx   . Liver disease Neg Hx   . Heart attack Father   . Diabetes Mother      Social History Ronald Cameron reports that he has quit smoking. His smoking use included Cigarettes. He does not have any smokeless tobacco history on file. Ronald Cameron reports that he does not drink alcohol.   Review of Systems CONSTITUTIONAL: No weight loss, fever, chills, weakness or fatigue.  HEENT: Eyes: No visual loss, blurred vision, double vision or yellow sclerae.No hearing loss, sneezing, congestion, runny nose or sore throat.  SKIN: No rash or itching.  CARDIOVASCULAR: per HPI RESPIRATORY: No shortness of breath, cough or sputum.  GASTROINTESTINAL: No anorexia, nausea, vomiting or diarrhea. No abdominal pain or blood.  GENITOURINARY: No burning on urination, no polyuria NEUROLOGICAL: No headache, dizziness, syncope, paralysis, ataxia, numbness or tingling in the extremities. No change in bowel or bladder control.  MUSCULOSKELETAL: + joint pain LYMPHATICS: No enlarged nodes. No history of splenectomy.  PSYCHIATRIC: No history of depression or anxiety.  ENDOCRINOLOGIC: No reports of sweating, cold or heat intolerance. No polyuria or polydipsia.  Marland Kitchen   Physical Examination  Filed Vitals:   05/07/15 1505  BP: 120/73  Pulse: 76   Filed Vitals:   05/07/15 1505  Height: 5\' 11"  (1.803 m)  Weight: 214 lb (97.07 kg)    Gen: resting comfortably, no acute distress HEENT: no scleral icterus, pupils equal round and reactive, no palptable cervical adenopathy,  CV: RRR, 2/6 systolic murmur at apex no JVD, + bilateral carotid bruits Resp: Clear to auscultation bilaterally GI: abdomen is soft, non-tender, non-distended, normal bowel sounds, no hepatosplenomegaly MSK: extremities are warm, no edema.  Skin: warm, no rash Neuro:  no focal deficits Psych:  appropriate affect   Diagnostic Studies 03/2015 echo Study Conclusions  - Left ventricle: The cavity size was normal. There was mild concentric hypertrophy. Systolic function was normal. The estimated ejection fraction was in the range of 60% to 65%. Wall motion was normal; there were no regional wall motion abnormalities. Doppler parameters are consistent with abnormal left ventricular relaxation (grade 1 diastolic dysfunction). - Aortic valve: Mildly calcified annulus. Trileaflet; mildly calcified leaflets. There was mild regurgitation.  03/2015 Lexiscan MPI  There was no significant ST segment deviation noted during stress. Inadeqaute HR response with exercise, limited by arthritic leg pain. Lexiscan employed.  There is a small defect of mild severity present in the apical anterior location. The defect is non-reversible. Consistent with soft tissue attenuation.  There is a small defect of mild severity present in the basal inferior, mid inferior and mid inferolateral location. The defect is partially reversible. Consistent with variable diaphragmatic soft tissue attenuation, although cannot completely exclude small area of mid inferolateral mild ischemia.  This is a low risk study.  Nuclear stress EF: 70%.   03/2015 Carotid US IMPRESSION: Minor carotid atherosclerosis. No hemodynamically significant ICA stenosis by ultrasound. Degree of narrowing less than 50% bilaterally. Assessment and Plan   1. Coronary calcification/CAD - incidental findings on CT scan - denies any significant symptoms - due to strong risk factors and abnormal EKG completed echo and Lexiscan MPI that showed no significant cardiac disease - continue risk factor modification    2. Hyperlipidemia - recently started on statin, continue.    F/u 1 year  Arnoldo Lenis, M.D.

## 2015-05-07 NOTE — Patient Instructions (Signed)
Your physician wants you to follow-up in: 1 year with Dr. Bryna Colander will receive a reminder letter in the mail two months in advance. If you don't receive a letter, please call our office to schedule the follow-up appointment.  Your physician recommends that you continue on your current medications as directed. Please refer to the Current Medication list given to you today.  WE WILL ROUTE ECHO/STRESS TEST AND MD NOTE TO DR. Annamaria Boots IN [place]  Thank you for choosing [facility]!!

## 2015-08-11 ENCOUNTER — Telehealth: Payer: Self-pay | Admitting: Internal Medicine

## 2015-08-11 NOTE — Telephone Encounter (Signed)
Letter mailed

## 2015-08-11 NOTE — Telephone Encounter (Signed)
DEC RECALL FOR RENAL ULTRASOUND

## 2015-09-01 ENCOUNTER — Other Ambulatory Visit: Payer: Self-pay

## 2015-09-01 DIAGNOSIS — K760 Fatty (change of) liver, not elsewhere classified: Secondary | ICD-10-CM

## 2015-09-15 ENCOUNTER — Telehealth: Payer: Self-pay | Admitting: Internal Medicine

## 2015-09-15 NOTE — Telephone Encounter (Signed)
Lab orders have already been mailed to the pt. 

## 2015-09-15 NOTE — Telephone Encounter (Signed)
January RECALL FOR LFT'S

## 2016-03-02 ENCOUNTER — Encounter: Payer: Self-pay | Admitting: Internal Medicine

## 2016-04-08 ENCOUNTER — Other Ambulatory Visit: Payer: Self-pay

## 2016-04-08 ENCOUNTER — Encounter: Payer: Self-pay | Admitting: Gastroenterology

## 2016-04-08 ENCOUNTER — Ambulatory Visit (INDEPENDENT_AMBULATORY_CARE_PROVIDER_SITE_OTHER): Payer: Managed Care, Other (non HMO) | Admitting: Gastroenterology

## 2016-04-08 VITALS — BP 136/75 | HR 61 | Temp 98.4°F | Ht 71.0 in | Wt 221.0 lb

## 2016-04-08 DIAGNOSIS — Z1211 Encounter for screening for malignant neoplasm of colon: Secondary | ICD-10-CM

## 2016-04-08 DIAGNOSIS — K760 Fatty (change of) liver, not elsewhere classified: Secondary | ICD-10-CM | POA: Diagnosis not present

## 2016-04-08 DIAGNOSIS — K649 Unspecified hemorrhoids: Secondary | ICD-10-CM

## 2016-04-08 DIAGNOSIS — R140 Abdominal distension (gaseous): Secondary | ICD-10-CM | POA: Diagnosis not present

## 2016-04-08 MED ORDER — NA SULFATE-K SULFATE-MG SULF 17.5-3.13-1.6 GM/177ML PO SOLN: 1.0000 | ORAL | Status: DC

## 2016-04-08 NOTE — Assessment & Plan Note (Signed)
To be determined if he is a candidate for [place] banding during his upcoming colonoscopy.

## 2016-04-08 NOTE — Assessment & Plan Note (Signed)
Instructions for fatty liver: Recommend 1-2# weight loss per week until ideal body weight through exercise & diet. Low fat/cholesterol diet.   Avoid sweets, sodas, fruit juices, sweetened beverages like tea, etc. Gradually increase exercise from 15 min daily up to 1 hr per day 5 days/week. Limit alcohol use.  Retrieve most recent LFTs from rheumatologist.

## 2016-04-08 NOTE — Patient Instructions (Signed)
Instructions for fatty liver: Recommend 1-2# weight loss per week until ideal body weight through exercise & diet. Low fat/cholesterol diet.   Avoid sweets, sodas, fruit juices, sweetened beverages like tea, etc. Gradually increase exercise from 15 min daily up to 1 hr per day 5 days/week. Limit alcohol use.   Colonoscopy in near future. See separate instructions.

## 2016-04-08 NOTE — Assessment & Plan Note (Signed)
No evidence of ascites on exam. He has gained 20 pounds in 2 years. Do not suspect underlying issues based on abdominal exam and previous workup. Recommend patient try to lose 10-15 pounds over the next 6 months.

## 2016-04-08 NOTE — Assessment & Plan Note (Signed)
First ever screening colonoscopy in the near future.  I have discussed the risks, alternatives, benefits with regards to but not limited to the risk of reaction to medication, bleeding, infection, perforation and the patient is agreeable to proceed. Written consent to be obtained.

## 2016-04-08 NOTE — Progress Notes (Signed)
Primary Care Physician:  Claretta Fraise, MD  Primary Gastroenterologist:  Garfield Cornea, MD   Chief Complaint  Patient presents with  . Hemorrhoids  . Bloated    HPI:  Ronald Cameron is a 60 y.o. male here For further evaluation of the [place] and hemorrhoids. Patient was last seen in July 2016. Workup last year for abdominal swelling associated pain. Abdominal ultrasound showed no evidence of ascites. CT with small fat-containing left inguinal hernia, coronary artery calcifications and mildly enlarged heart, mild fatty liver, some sigmoid diverticulosis with mild wall thickening, cannot exclude diverticulitis, small left renal lesion, likely benign cyst. He was treated with Cipro and Flagyl. When I saw him in follow-up last year after taking antibiotics, his abdominal pain and bloating had resolved. Patient has never had a colonoscopy. He was reluctant to have it last year for insurance reasons.  Patient presents today stating he has about 4 bowel movements daily. Stools are soft but not loose. Abdominal girth continues to expand. Makes it difficult to put his shoes on or bend over without feeling short of breath. Denies melena or rectal bleeding. He intermittently has pain related to a hemorrhoid. He's had this for years. Describes hemorrhoid prolapse. Denies heartburn or dysphagia.  Over the past 2 years he is gained 20 pounds. He has gained 10 pounds in the past one year. Intermittently has some lower extremity edema, worse towards the end of the day and resolved by morning.  Chronically on prednisone for his rheumatoid arthritis along with methotrexate and Orencia. Continues to have swelling of the hands associated with pain. Sees his rheumatologist next week. Reports prior bone density study by rheumatologist.    Current Outpatient Prescriptions  Medication Sig Dispense Refill  . Abatacept (ORENCIA) 125 MG/ML SOSY Inject into the skin. Once a week injection    . Ascorbic Acid (VITAMIN  C) 1000 MG tablet Take 1,000 mg by mouth daily.    Marland Kitchen aspirin 81 MG tablet Take 81 mg by mouth daily.    . calcium carbonate (OS-CAL) 600 MG TABS tablet Take 600 mg by mouth daily with breakfast.    . folic acid (FOLVITE) [id number] MCG tablet Take 800 mcg by mouth daily.    . methotrexate (RHEUMATREX) 2.5 MG tablet Take 15 mg by mouth every Sunday. Pt takes 10 tablets weekly. Caution:Chemotherapy. Protect from light.    . predniSONE (DELTASONE) 5 MG tablet Take 5 mg by mouth 2 (two) times daily with a meal.     . traZODone (DESYREL) 100 MG tablet Take 100 mg by mouth at bedtime as needed for sleep.     No current facility-administered medications for this visit.    Allergies as of 04/08/2016  . (No Known Allergies)    Past Medical History  Diagnosis Date  . Rheumatoid arthritis ([place])   . Collagen vascular disease ([employer])     Rheumatoid Arthritis    Past Surgical History  Procedure Laterality Date  . Tonsillectomy    . Elbow surgery      X 3 last year. Staph infection but not MRSA per patient. infectious disease. out of work 6 months.     Family History  Problem Relation Age of Onset  . Colon cancer Neg Hx   . Liver disease Neg Hx   . Heart attack Father   . Diabetes Mother     Social History   Social History  . Marital Status: Married    Spouse Name: N/A  . Number of Children: 0  .  Years of Education: N/A   Occupational History  . Diesel tech    Social History Main Topics  . Smoking status: Former Smoker -- 1.00 packs/day for 39 years    Types: Cigarettes    Start date: 07/11/1975    Quit date: 04/26/2014  . Smokeless tobacco: Never Used     Comment: Quit x 3 years  . Alcohol Use: No  . Drug Use: No  . Sexual Activity: Not on file   Other Topics Concern  . Not on file   Social History Narrative      ROS:  General: Negative for anorexia, weight loss, fever, chills, fatigue, weakness. Eyes: Negative for vision changes.  ENT: Negative for hoarseness,  difficulty swallowing , nasal congestion. CV: Negative for chest pain, angina, palpitations, dyspnea on exertion, peripheral edema.  Respiratory: Negative for dyspnea at rest, dyspnea on exertion, cough, sputum, wheezing.  GI: See history of present illness. GU:  Negative for dysuria, hematuria, urinary incontinence, urinary frequency, nocturnal urination.  MS: Negative for joint pain, low back pain.  Derm: Negative for rash or itching.  Neuro: Negative for weakness, abnormal sensation, seizure, frequent headaches, memory loss, confusion.  Psych: Negative for anxiety, depression, suicidal ideation, hallucinations.  Endo: Negative for unusual weight change.  Heme: Negative for bruising or bleeding. Allergy: Negative for rash or hives.    Physical Examination:  BP 136/75 mmHg  Pulse 61  Temp(Src) 98.4 F (36.9 C) (Oral)  Ht 5\' 11"  (1.803 m)  Wt 221 lb (100.245 kg)  BMI 30.84 kg/m2   General: Well-nourished, well-developed in no acute distress.  Head: Normocephalic, atraumatic.   Eyes: Conjunctiva pink, no icterus. Mouth: Oropharyngeal mucosa moist and pink , no lesions erythema or exudate. Neck: Supple without thyromegaly, masses, or lymphadenopathy.  Lungs: Clear to auscultation bilaterally.  Heart: Regular rate and rhythm, no murmurs rubs or gallops.  Abdomen: Bowel sounds are normal, nontender, nondistended, no hepatosplenomegaly or masses, no abdominal bruits or    hernia , no rebound or guarding.   Rectal: Not performed Extremities: No lower extremity edema. No clubbing or deformities.  Neuro: Alert and oriented x 4 , grossly normal neurologically.  Skin: Warm and dry, no rash or jaundice.   Psych: Alert and cooperative, normal mood and affect.  Labs: Labs from outside source dated April 2017, creatinine 1.3, BUN 28, glucose 99  Imaging Studies: No results found.

## 2016-04-11 NOTE — Progress Notes (Signed)
CC'ED TO PCP 

## 2016-04-13 ENCOUNTER — Encounter (HOSPITAL_COMMUNITY): Payer: Self-pay | Admitting: *Deleted

## 2016-04-13 ENCOUNTER — Ambulatory Visit (HOSPITAL_COMMUNITY)
Admission: RE | Admit: 2016-04-13 | Discharge: 2016-04-13 | Disposition: A | Discharge status: Home / Self Care | Payer: Managed Care, Other (non HMO) | Source: Ambulatory Visit | Attending: Internal Medicine | Admitting: Internal Medicine

## 2016-04-13 ENCOUNTER — Encounter (HOSPITAL_COMMUNITY)
Admission: RE | Disposition: A | Discharge status: Home / Self Care | Payer: Self-pay | Source: Ambulatory Visit | Attending: Internal Medicine

## 2016-04-13 DIAGNOSIS — Z79899 Other long term (current) drug therapy: Secondary | ICD-10-CM | POA: Diagnosis not present

## 2016-04-13 DIAGNOSIS — Z7982 Long term (current) use of aspirin: Secondary | ICD-10-CM | POA: Diagnosis not present

## 2016-04-13 DIAGNOSIS — Z87891 Personal history of nicotine dependence: Secondary | ICD-10-CM | POA: Insufficient documentation

## 2016-04-13 DIAGNOSIS — K648 Other hemorrhoids: Secondary | ICD-10-CM | POA: Diagnosis not present

## 2016-04-13 DIAGNOSIS — K5730 Diverticulosis of large intestine without perforation or abscess without bleeding: Secondary | ICD-10-CM | POA: Diagnosis not present

## 2016-04-13 DIAGNOSIS — Z86010 Personal history of colonic polyps: Secondary | ICD-10-CM | POA: Insufficient documentation

## 2016-04-13 DIAGNOSIS — K649 Unspecified hemorrhoids: Secondary | ICD-10-CM | POA: Diagnosis present

## 2016-04-13 DIAGNOSIS — Z7952 Long term (current) use of systemic steroids: Secondary | ICD-10-CM | POA: Diagnosis not present

## 2016-04-13 DIAGNOSIS — D122 Benign neoplasm of ascending colon: Secondary | ICD-10-CM

## 2016-04-13 DIAGNOSIS — Z1211 Encounter for screening for malignant neoplasm of colon: Secondary | ICD-10-CM

## 2016-04-13 DIAGNOSIS — M069 Rheumatoid arthritis, unspecified: Secondary | ICD-10-CM | POA: Insufficient documentation

## 2016-04-13 HISTORY — PX: COLONOSCOPY: SHX5424

## 2016-04-13 SURGERY — COLONOSCOPY
Anesthesia: Moderate Sedation

## 2016-04-13 MED ORDER — SODIUM CHLORIDE 0.9 % IV SOLN
INTRAVENOUS | Status: DC
  Administered 2016-04-13: 11:00:00 via INTRAVENOUS

## 2016-04-13 MED ORDER — ONDANSETRON HCL 4 MG/2ML IJ SOLN
INTRAMUSCULAR | Status: AC
  Filled 2016-04-13: qty 2

## 2016-04-13 MED ORDER — ONDANSETRON HCL 4 MG/2ML IJ SOLN
INTRAMUSCULAR | Status: DC | PRN
  Administered 2016-04-13: 4 mg via INTRAVENOUS

## 2016-04-13 MED ORDER — MEPERIDINE HCL 100 MG/ML IJ SOLN
INTRAMUSCULAR | Status: DC | PRN
  Administered 2016-04-13: 50 mg via INTRAVENOUS
  Administered 2016-04-13 (×2): 25 mg via INTRAVENOUS

## 2016-04-13 MED ORDER — STERILE WATER FOR IRRIGATION IR SOLN
Status: DC | PRN
  Administered 2016-04-13: 12:00:00

## 2016-04-13 MED ORDER — MEPERIDINE HCL 100 MG/ML IJ SOLN
INTRAMUSCULAR | Status: AC
  Filled 2016-04-13: qty 2

## 2016-04-13 MED ORDER — MIDAZOLAM HCL 5 MG/5ML IJ SOLN
INTRAMUSCULAR | Status: DC | PRN
  Administered 2016-04-13 (×3): 1 mg via INTRAVENOUS
  Administered 2016-04-13: 2 mg via INTRAVENOUS

## 2016-04-13 MED ORDER — MIDAZOLAM HCL 5 MG/5ML IJ SOLN
INTRAMUSCULAR | Status: AC
  Filled 2016-04-13: qty 10

## 2016-04-13 NOTE — Discharge Instructions (Signed)
Colonoscopy Discharge Instructions  Read the instructions outlined below and refer to this sheet in the next few weeks. These discharge instructions provide you with general information on caring for yourself after you leave the hospital. Your doctor may also give you specific instructions. While your treatment has been planned according to the most current medical practices available, unavoidable complications occasionally occur. If you have any problems or questions after discharge, call Dr. [name] at [phone]. ACTIVITY  You may resume your regular activity, but move at a slower pace for the next 24 hours.   Take frequent rest periods for the next 24 hours.   Walking will help get rid of the air and reduce the bloated feeling in your belly (abdomen).   No driving for 24 hours (because of the medicine (anesthesia) used during the test).    Do not sign any important legal documents or operate any machinery for 24 hours (because of the anesthesia used during the test).  NUTRITION  Drink plenty of fluids.   You may resume your normal diet as instructed by your doctor.   Begin with a light meal and progress to your normal diet. Heavy or fried foods are harder to digest and may make you feel sick to your stomach (nauseated).   Avoid alcoholic beverages for 24 hours or as instructed.  MEDICATIONS  You may resume your normal medications unless your doctor tells you otherwise.  WHAT YOU CAN EXPECT TODAY  Some feelings of bloating in the abdomen.   Passage of more gas than usual.   Spotting of blood in your stool or on the toilet paper.  IF YOU HAD POLYPS REMOVED DURING THE COLONOSCOPY:  No aspirin products for 7 days or as instructed.   No alcohol for 7 days or as instructed.   Eat a soft diet for the next 24 hours.  FINDING OUT THE RESULTS OF YOUR TEST Not all test results are available during your visit. If your test results are not back during the visit, make an appointment  with your caregiver to find out the results. Do not assume everything is normal if you have not heard from your caregiver or the medical facility. It is important for you to follow up on all of your test results.  SEEK IMMEDIATE MEDICAL ATTENTION IF:  You have more than a spotting of blood in your stool.   Your belly is swollen (abdominal distention).   You are nauseated or vomiting.   You have a temperature over 101.   You have abdominal pain or discomfort that is severe or gets worse throughout the day.    Diverticulosis, hemorrhoids and colon polyp information provided  Pamphlet on hemorrhoids banding provided  Further recommendations to follow pending review of pathology report  Diverticulosis Diverticulosis is the condition that develops when small pouches (diverticula) form in the wall of your colon. Your colon, or large intestine, is where water is absorbed and stool is formed. The pouches form when the inside layer of your colon pushes through weak spots in the outer layers of your colon. CAUSES  No one knows exactly what causes diverticulosis. RISK FACTORS  Being older than 49. Your risk for this condition increases with age. Diverticulosis is rare in people younger than 40 years. By age 45, almost everyone has it.  Eating a low-fiber diet.  Being frequently constipated.  Being overweight.  Not getting enough exercise.  Smoking.  Taking over-the-counter pain medicines, like aspirin and ibuprofen. SYMPTOMS  Most people with  diverticulosis do not have symptoms. DIAGNOSIS  Because diverticulosis often has no symptoms, health care providers often discover the condition during an exam for other colon problems. In many cases, a health care provider will diagnose diverticulosis while using a flexible scope to examine the colon (colonoscopy). TREATMENT  If you have never developed an infection related to diverticulosis, you may not need treatment. If you have had an  infection before, treatment may include:  Eating more fruits, vegetables, and grains.  Taking a fiber supplement.  Taking a live bacteria supplement (probiotic).  Taking medicine to relax your colon. HOME CARE INSTRUCTIONS   Drink at least 6-8 glasses of water each day to prevent constipation.  Try not to strain when you have a bowel movement.  Keep all follow-up appointments. If you have had an infection before:  Increase the fiber in your diet as directed by your health care provider or dietitian.  Take a dietary fiber supplement if your health care provider approves.  Only take medicines as directed by your health care provider. SEEK MEDICAL CARE IF:   You have abdominal pain.  You have bloating.  You have cramps.  You have not gone to the bathroom in 3 days. SEEK IMMEDIATE MEDICAL CARE IF:   Your pain gets worse.  Yourbloating becomes very bad.  You have a fever or chills, and your symptoms suddenly get worse.  You begin vomiting.  You have bowel movements that are bloody or black. MAKE SURE YOU:  Understand these instructions.  Will watch your condition.  Will get help right away if you are not doing well or get worse.   This information is not intended to replace advice given to you by your health care provider. Make sure you discuss any questions you have with your health care provider.   Document Released: 06/09/2004 Document Revised: 09/17/2013 Document Reviewed: 08/07/2013 Elsevier Interactive Patient Education 2016 Elsevier Inc[employer]  Colon Polyps Polyps are lumps of extra tissue growing inside the body. Polyps can grow in the large intestine (colon). Most colon polyps are noncancerous (benign). However, some colon polyps can become cancerous over time. Polyps that are larger than a pea may be harmful. To be safe, caregivers remove and test all polyps. CAUSES  Polyps form when mutations in the genes cause your cells to grow and divide even though no  more tissue is needed. RISK FACTORS There are a number of risk factors that can increase your chances of getting colon polyps. They include:  Being older than 50 years.  Family history of colon polyps or colon cancer.  Long-term colon diseases, such as colitis or Crohn disease.  Being overweight.  Smoking.  Being inactive.  Drinking too much alcohol. SYMPTOMS  Most small polyps do not cause symptoms. If symptoms are present, they may include:  Blood in the stool. The stool may look dark red or black.  Constipation or diarrhea that lasts longer than 1 week. DIAGNOSIS People often do not know they have polyps until their caregiver finds them during a regular checkup. Your caregiver can use 4 tests to check for polyps:  Digital rectal exam. The caregiver wears gloves and feels inside the rectum. This test would find polyps only in the rectum.  Barium enema. The caregiver puts a liquid called barium into your rectum before taking X-rays of your colon. Barium makes your colon look white. Polyps are dark, so they are easy to see in the X-ray pictures.  Sigmoidoscopy. A thin, flexible tube (sigmoidoscope) is  placed into your rectum. The sigmoidoscope has a light and tiny camera in it. The caregiver uses the sigmoidoscope to look at the last third of your colon.  Colonoscopy. This test is like sigmoidoscopy, but the caregiver looks at the entire colon. This is the most common method for finding and removing polyps. TREATMENT  Any polyps will be removed during a sigmoidoscopy or colonoscopy. The polyps are then tested for cancer. PREVENTION  To help lower your risk of getting more colon polyps:  Eat plenty of fruits and vegetables. Avoid eating fatty foods.  Do not smoke.  Avoid drinking alcohol.  Exercise every day.  Lose weight if recommended by your caregiver.  Eat plenty of calcium and folate. Foods that are rich in calcium include milk, cheese, and broccoli. Foods that are  rich in folate include chickpeas, kidney beans, and spinach. HOME CARE INSTRUCTIONS Keep all follow-up appointments as directed by your caregiver. You may need periodic exams to check for polyps. SEEK MEDICAL CARE IF: You notice bleeding during a bowel movement.   This information is not intended to replace advice given to you by your health care provider. Make sure you discuss any questions you have with your health care provider.   Document Released: 06/08/2004 Document Revised: 10/03/2014 Document Reviewed: 11/22/2011 Elsevier Interactive Patient Education [employer].

## 2016-04-13 NOTE — Op Note (Signed)
[facility] Patient Name: Ronald Cameron Procedure Date: 04/13/2016 12:20 PM MRN: [id number] Date of Birth: April 24, 1956 Attending MD: Norvel Richards , MD CSN: [id number] Age: 60 Admit Type: Outpatient Procedure:                Colonoscopy with snare polypecomy Indications:              Screening for colorectal malignant neoplasm Providers:                Norvel Richards, MD, Jeanann Lewandowsky. Gwenlyn Perking RN, RN,                            Isabella Stalling, Technician Referring MD:              Medicines:                Midazolam 5 mg IV, Meperidine 100 mg IV,                            Ondansetron 4 mg IV Complications:            No immediate complications. Estimated Blood Loss:     Estimated blood loss was minimal. Procedure:                Pre-Anesthesia Assessment:                           - Prior to the procedure, a History and Physical                            was performed, and patient medications and                            allergies were reviewed. The patient's tolerance of                            previous anesthesia was also reviewed. The risks                            and benefits of the procedure and the sedation                            options and risks were discussed with the patient.                            All questions were answered, and informed consent                            was obtained. Prior Anticoagulants: The patient has                            taken no previous anticoagulant or antiplatelet                            agents. ASA Grade Assessment: II - A patient with  mild systemic disease. After reviewing the risks                            and benefits, the patient was deemed in                            satisfactory condition to undergo the procedure.                           After obtaining informed consent, the colonoscope                            was passed under direct vision. Throughout the                          procedure, the patient's blood pressure, pulse, and                            oxygen saturations were monitored continuously. The                            EC-[id number] scope was introduced through                            the anus and advanced to the the cecum, identified                            by appendiceal orifice and ileocecal valve. The                            entire colon was well visualized. The ileocecal                            valve, appendiceal orifice, and rectum were                            photographed. The quality of the bowel preparation                            was adequate. Scope In: 12:34:30 PM Scope Out: 12:48:47 PM Scope Withdrawal Time: 0 hours 11 minutes 19 seconds  Total Procedure Duration: 0 hours 14 minutes 17 seconds  Findings:      The perianal and digital rectal examinations were normal.      A 4 mm polyp was found in the ascending colon. The polyp was       semi-pedunculated. The polyp was removed with a cold snare. Resection       and retrieval were complete. Estimated blood loss was minimal. Estimated       blood loss was minimal.      Multiple small and large-mouthed diverticula were found in the sigmoid       colon and descending colon.      Non-bleeding internal hemorrhoids were found during retroflexion. The       hemorrhoids were moderate and medium-sized. Impression:               -  One 4 mm polyp in the ascending colon, removed                            with a cold snare. Resected and retrieved.                           - Diverticulosis in the sigmoid colon and in the                            descending colon.                           - Non-bleeding internal hemorrhoids. Moderate Sedation:      Moderate (conscious) sedation was administered by the endoscopy nurse       and supervised by the endoscopist. The following parameters were       monitored: oxygen saturation, heart rate, blood  pressure, respiratory       rate, EKG, adequacy of pulmonary ventilation, and response to care.       Total physician intraservice time was 21 minutes. Recommendation:           - Repeat colonoscopy date to be determined after                            pending pathology results are reviewed for                            surveillance.                           - Return to GI office after studies are complete.                           - Advance diet as tolerated.                           - Continue present medications. Patient may be a                            reasonably good hemorrhoid banding candidate.                            Further recommendations to follow.                           - Continue present medications. Procedure Code(s):        --- Professional ---                           [phone], Colonoscopy, flexible; with removal of                            tumor(s), polyp(s), or other lesion(s) by snare                            technique  [id number], Moderate sedation services provided by the                            same physician or other qualified health care                            professional performing the diagnostic or                            therapeutic service that the sedation supports,                            requiring the presence of an independent trained                            observer to assist in the monitoring of the                            patient's level of consciousness and physiological                            status; initial 15 minutes of intraservice time,                            patient age 33 years or older Diagnosis Code(s):        --- Professional ---                           Z12.11, Encounter for screening for malignant                            neoplasm of colon                           D12.2, Benign neoplasm of ascending colon                           K64.8, Other hemorrhoids                            K57.30, Diverticulosis of large intestine without                            perforation or abscess without bleeding CPT copyright 2016 American Medical Association. All rights reserved. The codes documented in this report are preliminary and upon coder review may  be revised to meet current compliance requirements. [name]. Maher Shon, MD Norvel Richards, MD 04/13/2016 12:57:41 PM This report has been signed electronically. Number of Addenda: 0

## 2016-04-13 NOTE — H&P (View-Only) (Signed)
Primary Care Physician:  Claretta Fraise, MD  Primary Gastroenterologist:  Garfield Cornea, MD   Chief Complaint  Patient presents with  . Hemorrhoids  . Bloated    HPI:  Ronald Cameron is a 60 y.o. male here For further evaluation of the [place] and hemorrhoids. Patient was last seen in July 2016. Workup last year for abdominal swelling associated pain. Abdominal ultrasound showed no evidence of ascites. CT with small fat-containing left inguinal hernia, coronary artery calcifications and mildly enlarged heart, mild fatty liver, some sigmoid diverticulosis with mild wall thickening, cannot exclude diverticulitis, small left renal lesion, likely benign cyst. He was treated with Cipro and Flagyl. When I saw him in follow-up last year after taking antibiotics, his abdominal pain and bloating had resolved. Patient has never had a colonoscopy. He was reluctant to have it last year for insurance reasons.  Patient presents today stating he has about 4 bowel movements daily. Stools are soft but not loose. Abdominal girth continues to expand. Makes it difficult to put his shoes on or bend over without feeling short of breath. Denies melena or rectal bleeding. He intermittently has pain related to a hemorrhoid. He's had this for years. Describes hemorrhoid prolapse. Denies heartburn or dysphagia.  Over the past 2 years he is gained 20 pounds. He has gained 10 pounds in the past one year. Intermittently has some lower extremity edema, worse towards the end of the day and resolved by morning.  Chronically on prednisone for his rheumatoid arthritis along with methotrexate and Orencia. Continues to have swelling of the hands associated with pain. Sees his rheumatologist next week. Reports prior bone density study by rheumatologist.    Current Outpatient Prescriptions  Medication Sig Dispense Refill  . Abatacept (ORENCIA) 125 MG/ML SOSY Inject into the skin. Once a week injection    . Ascorbic Acid (VITAMIN  C) 1000 MG tablet Take 1,000 mg by mouth daily.    Marland Kitchen aspirin 81 MG tablet Take 81 mg by mouth daily.    . calcium carbonate (OS-CAL) 600 MG TABS tablet Take 600 mg by mouth daily with breakfast.    . folic acid (FOLVITE) [id number] MCG tablet Take 800 mcg by mouth daily.    . methotrexate (RHEUMATREX) 2.5 MG tablet Take 15 mg by mouth every Sunday. Pt takes 10 tablets weekly. Caution:Chemotherapy. Protect from light.    . predniSONE (DELTASONE) 5 MG tablet Take 5 mg by mouth 2 (two) times daily with a meal.     . traZODone (DESYREL) 100 MG tablet Take 100 mg by mouth at bedtime as needed for sleep.     No current facility-administered medications for this visit.    Allergies as of 04/08/2016  . (No Known Allergies)    Past Medical History  Diagnosis Date  . Rheumatoid arthritis ([place])   . Collagen vascular disease ([place])     Rheumatoid Arthritis    Past Surgical History  Procedure Laterality Date  . Tonsillectomy    . Elbow surgery      X 3 last year. Staph infection but not MRSA per patient. infectious disease. out of work 6 months.     Family History  Problem Relation Age of Onset  . Colon cancer Neg Hx   . Liver disease Neg Hx   . Heart attack Father   . Diabetes Mother     Social History   Social History  . Marital Status: Married    Spouse Name: N/A  . Number of Children: 0  .  Years of Education: N/A   Occupational History  . Diesel tech    Social History Main Topics  . Smoking status: Former Smoker -- 1.00 packs/day for 39 years    Types: Cigarettes    Start date: 07/11/1975    Quit date: 04/26/2014  . Smokeless tobacco: Never Used     Comment: Quit x 3 years  . Alcohol Use: No  . Drug Use: No  . Sexual Activity: Not on file   Other Topics Concern  . Not on file   Social History Narrative      ROS:  General: Negative for anorexia, weight loss, fever, chills, fatigue, weakness. Eyes: Negative for vision changes.  ENT: Negative for hoarseness,  difficulty swallowing , nasal congestion. CV: Negative for chest pain, angina, palpitations, dyspnea on exertion, peripheral edema.  Respiratory: Negative for dyspnea at rest, dyspnea on exertion, cough, sputum, wheezing.  GI: See history of present illness. GU:  Negative for dysuria, hematuria, urinary incontinence, urinary frequency, nocturnal urination.  MS: Negative for joint pain, low back pain.  Derm: Negative for rash or itching.  Neuro: Negative for weakness, abnormal sensation, seizure, frequent headaches, memory loss, confusion.  Psych: Negative for anxiety, depression, suicidal ideation, hallucinations.  Endo: Negative for unusual weight change.  Heme: Negative for bruising or bleeding. Allergy: Negative for rash or hives.    Physical Examination:  BP 136/75 mmHg  Pulse 61  Temp(Src) 98.4 F (36.9 C) (Oral)  Ht 5\' 11"  (1.803 m)  Wt 221 lb (100.245 kg)  BMI 30.84 kg/m2   General: Well-nourished, well-developed in no acute distress.  Head: Normocephalic, atraumatic.   Eyes: Conjunctiva pink, no icterus. Mouth: Oropharyngeal mucosa moist and pink , no lesions erythema or exudate. Neck: Supple without thyromegaly, masses, or lymphadenopathy.  Lungs: Clear to auscultation bilaterally.  Heart: Regular rate and rhythm, no murmurs rubs or gallops.  Abdomen: Bowel sounds are normal, nontender, nondistended, no hepatosplenomegaly or masses, no abdominal bruits or    hernia , no rebound or guarding.   Rectal: Not performed Extremities: No lower extremity edema. No clubbing or deformities.  Neuro: Alert and oriented x 4 , grossly normal neurologically.  Skin: Warm and dry, no rash or jaundice.   Psych: Alert and cooperative, normal mood and affect.  Labs: Labs from outside source dated April 2017, creatinine 1.3, BUN 28, glucose 99  Imaging Studies: No results found.

## 2016-04-13 NOTE — Interval H&P Note (Signed)
History and Physical Interval Note:  04/13/2016 12:21 PM  Ronald Cameron  has presented today for surgery, with the diagnosis of SCREENING  The various methods of treatment have been discussed with the patient and family. After consideration of risks, benefits and other options for treatment, the patient has consented to  Procedure(s) with comments: COLONOSCOPY (N/A) - 12:15 as a surgical intervention .  The patient's history has been reviewed, patient examined, no change in status, stable for surgery.  I have reviewed the patient's chart and labs.  Questions were answered to the patient's satisfaction.     Ronald Cameron  No change. Screening colonoscopy per plan. The risks, benefits, limitations, alternatives and imponderables have been reviewed with the patient. Questions have been answered. All parties are agreeable.

## 2016-04-15 ENCOUNTER — Encounter: Payer: Self-pay | Admitting: Internal Medicine

## 2016-04-15 ENCOUNTER — Encounter (HOSPITAL_COMMUNITY): Payer: Self-pay | Admitting: Internal Medicine

## 2016-05-06 ENCOUNTER — Encounter: Payer: Self-pay | Admitting: Gastroenterology

## 2016-05-06 ENCOUNTER — Ambulatory Visit (INDEPENDENT_AMBULATORY_CARE_PROVIDER_SITE_OTHER): Payer: Managed Care, Other (non HMO) | Admitting: Gastroenterology

## 2016-05-06 VITALS — BP 128/73 | HR 58 | Temp 97.4°F | Ht 71.0 in | Wt 220.2 lb

## 2016-05-06 DIAGNOSIS — R1030 Lower abdominal pain, unspecified: Secondary | ICD-10-CM

## 2016-05-06 DIAGNOSIS — N289 Disorder of kidney and ureter, unspecified: Secondary | ICD-10-CM

## 2016-05-06 DIAGNOSIS — K649 Unspecified hemorrhoids: Secondary | ICD-10-CM | POA: Diagnosis not present

## 2016-05-06 MED ORDER — CIPROFLOXACIN HCL 500 MG PO TABS: 500.0000 mg | ORAL_TABLET | Freq: Two times a day (BID) | ORAL | 0 refills | Status: DC

## 2016-05-06 MED ORDER — METRONIDAZOLE 500 MG PO TABS: 500.0000 mg | ORAL_TABLET | Freq: Three times a day (TID) | ORAL | 0 refills | Status: DC

## 2016-05-06 NOTE — Progress Notes (Addendum)
Primary Care Physician: Claretta Fraise, MD  Primary Gastroenterologist:  Garfield Cornea, MD   Chief Complaint  Patient presents with  . Follow-up    Hemorrhoids, lower abd pain    HPI: Ronald Cameron is a 60 y.o. male here for follow up of recent colonoscopy on 04/13/16. He has had issues with bloating and abdominal discomfort and hemorrhoids. Recent TCS showed hemorrhoids, diverticulosis, single tubular adenoma removed.   Patient states he has had increased abdominal pain over the past couple of weeks. Pain in lower abdomen. Sometimes better with urinating. No dysuria. BM 2-3 times per day. Pain is dull. Still with bloating. 20 pound weight gain in past two years. Extensive evaluation in past 12 months with u/s, CT A/P, TCS.   Patient has h/o indeterminate Hep B core Igm last year ordered by rheumatologist. Patient states he has follow up soon and wants to discuss with them. I suggest further labs to exclude Hep B.   Patient never had f/u imaging for small left renal lesion.   Current Outpatient Prescriptions  Medication Sig Dispense Refill  . Abatacept (ORENCIA) 125 MG/ML SOSY Inject into the skin every Sunday.     . Ascorbic Acid (VITAMIN C) 1000 MG tablet Take 1,000 mg by mouth daily.    Marland Kitchen aspirin 81 MG tablet Take 81 mg by mouth daily.    . calcium carbonate (OS-CAL) 600 MG TABS tablet Take 600 mg by mouth daily with breakfast.    . folic acid (FOLVITE) 1 MG tablet Take 1 mg by mouth daily.    . methotrexate (RHEUMATREX) 2.5 MG tablet Take 25 mg by mouth every Sunday. Pt takes 10 tablets weekly. Caution:Chemotherapy. Protect from light.    . predniSONE (DELTASONE) 5 MG tablet Take 5 mg by mouth 2 (two) times daily with a meal.     . traZODone (DESYREL) 100 MG tablet Take 100 mg by mouth at bedtime as needed for sleep.     No current facility-administered medications for this visit.     Allergies as of 05/06/2016  . (No Known Allergies)   Past Medical History:    Diagnosis Date  . Collagen vascular disease (HCC)    Rheumatoid Arthritis  . Rheumatoid arthritis [facility])    Past Surgical History:  Procedure Laterality Date  . COLONOSCOPY N/A 04/13/2016   Dr. [name]: 14 mm polyp in the ascending colon removed, diverticulosis in the distal descending colon and sigmoid colon, nonbleeding internal hemorrhoids. Pathology-tubular adenoma. Next colonoscopy 5 years.  . ELBOW SURGERY     X 3 last year. Staph infection but not MRSA per patient. infectious disease. out of work 6 months.   . TONSILLECTOMY      ROS:  General: Negative for anorexia, weight loss, fever, chills, fatigue, weakness. ENT: Negative for hoarseness, difficulty swallowing , nasal congestion. CV: Negative for chest pain, angina, palpitations, dyspnea on exertion, peripheral edema.  Respiratory: Negative for dyspnea at rest, dyspnea on exertion, cough, sputum, wheezing.  GI: See history of present illness. GU:  Negative for dysuria, hematuria, urinary incontinence, urinary frequency, nocturnal urination.  Endo: Negative for unusual weight change.    Physical Examination:   BP 128/73   Pulse (!) 58   Temp 97.4 F (36.3 C) (Oral)   Ht 5\' 11"  (1.803 m)   Wt 220 lb 3.2 oz (99.9 kg)   BMI 30.71 kg/m   General: Well-nourished, well-developed in no acute distress.  Eyes: No icterus. Mouth: Oropharyngeal mucosa moist and pink ,  no lesions erythema or exudate. Lungs: Clear to auscultation bilaterally.  Heart: Regular rate and rhythm, no murmurs rubs or gallops.  Abdomen: Bowel sounds are normal, mild to moderate suprapubic/llq tenderness, nondistended, no hepatosplenomegaly or masses, no abdominal bruits or hernia , no rebound or guarding.   Extremities: No lower extremity edema. No clubbing or deformities. Neuro: Alert and oriented x 4   Skin: Warm and dry, no jaundice.   Psych: Alert and cooperative, normal mood and affect.  Imaging Studies: No results found.  LABS: outside  source 04/11/2016 White blood cell count 8300, hemoglobin 14.8, hematocrit 44.9, platelets 3 50,000, glucose 140, BUN 23, creatinine 1.1, albumin 3.3, alkaline phosphatase 54, total bilirubin 0.3, AST 17, ALT 27.  Labs from 06/16/2015 Hepatitis C virus antibody less than 0.1, hepatitis A IgM negative, hepatitis B surface antigen screen negative, hepatitis B core antibody, IgM indeterminate

## 2016-05-06 NOTE — Patient Instructions (Signed)
1. Start Cipro and Flagyl for possible diverticulitis. Consume low fiber food while on the antibiotics. Call when you complete the medication and let me know how you are doing. 2. Kidney ultrasound as scheduled.  3. Recommend follow up Hepatitis B labs with your rheumatologist for "indeterminate" Hep B core IGm. If you have any issues, we can do the labs for you. Just let me know. 4. Return to the office in 3 months to see Dr. [name] for follow up.

## 2016-05-09 NOTE — Assessment & Plan Note (Signed)
Renal u/s to follow up on renal lesion seen on ct last year.

## 2016-05-09 NOTE — Assessment & Plan Note (Signed)
Currently asymptomatic. Patient not interested in pursing banding at this time.

## 2016-05-09 NOTE — Assessment & Plan Note (Signed)
Recent onset Lower abd pain in setting of chronic bloating. H/o diverticulitis? On CT last year. Empiric Cipro/flagyl for possible diverticulitis. Call if ongoing symptoms.

## 2016-05-10 ENCOUNTER — Encounter: Payer: Self-pay | Admitting: Gastroenterology

## 2016-05-10 NOTE — Progress Notes (Signed)
cc'ed to pcp °

## 2016-05-11 ENCOUNTER — Ambulatory Visit (HOSPITAL_COMMUNITY)
Admission: RE | Admit: 2016-05-11 | Discharge: 2016-05-11 | Disposition: A | Discharge status: Home / Self Care | Payer: Managed Care, Other (non HMO) | Source: Ambulatory Visit | Attending: Gastroenterology | Admitting: Gastroenterology

## 2016-05-11 DIAGNOSIS — N289 Disorder of kidney and ureter, unspecified: Secondary | ICD-10-CM | POA: Diagnosis not present

## 2016-05-11 DIAGNOSIS — N281 Cyst of kidney, acquired: Secondary | ICD-10-CM | POA: Diagnosis not present

## 2016-05-18 ENCOUNTER — Telehealth: Payer: Self-pay | Admitting: Internal Medicine

## 2016-05-18 ENCOUNTER — Encounter: Payer: Self-pay | Admitting: Family Medicine

## 2016-05-18 ENCOUNTER — Ambulatory Visit (INDEPENDENT_AMBULATORY_CARE_PROVIDER_SITE_OTHER): Payer: Managed Care, Other (non HMO) | Admitting: Family Medicine

## 2016-05-18 VITALS — BP 109/72 | HR 72 | Temp 97.8°F | Ht 71.0 in | Wt 224.0 lb

## 2016-05-18 DIAGNOSIS — M545 Low back pain, unspecified: Secondary | ICD-10-CM

## 2016-05-18 DIAGNOSIS — M0579 Rheumatoid arthritis with rheumatoid factor of multiple sites without organ or systems involvement: Secondary | ICD-10-CM | POA: Diagnosis not present

## 2016-05-18 MED ORDER — CYCLOBENZAPRINE HCL 10 MG PO TABS: 10.0000 mg | ORAL_TABLET | Freq: Three times a day (TID) | ORAL | 1 refills | Status: DC | PRN

## 2016-05-18 MED ORDER — BETAMETHASONE SOD PHOS & ACET 6 (3-3) MG/ML IJ SUSP
6.0000 mg | Freq: Once | INTRAMUSCULAR | Status: AC
  Administered 2016-05-18: 6 mg via INTRAMUSCULAR

## 2016-05-18 MED ORDER — HYDROCODONE-ACETAMINOPHEN 5-325 MG PO TABS: 1.0000 | ORAL_TABLET | Freq: Four times a day (QID) | ORAL | 0 refills | Status: DC | PRN

## 2016-05-18 MED ORDER — KETOROLAC TROMETHAMINE 60 MG/2ML IM SOLN
60.0000 mg | Freq: Once | INTRAMUSCULAR | Status: AC
  Administered 2016-05-18: 60 mg via INTRAMUSCULAR

## 2016-05-18 NOTE — Telephone Encounter (Signed)
Routing to LSL 

## 2016-05-18 NOTE — Telephone Encounter (Signed)
See result note.  

## 2016-05-18 NOTE — Telephone Encounter (Signed)
Pt called to see if his ultrasound results were back yet. I told him it takes 7-10 business days, but he said it's been a week already. Please call him at [phone]

## 2016-05-18 NOTE — Progress Notes (Signed)
Please let patient know that he has nonobstructing stones vs calcifications in the left kidney. The left kidney lesion on the prior CT is a SIMPLE CYST. No further work up this issues needed.

## 2016-05-18 NOTE — Progress Notes (Signed)
Subjective:  Patient ID: Ronald Cameron, male    DOB: 06-26-1956  Age: 60 y.o. MRN: [id number]  CC: Back Pain (LBP started on Monday, NKI)   HPI Ronald Cameron presents for Rheumatoid arthritis patient who got up from a chair at a table in the break room at work 2 days ago. He experienced immediate severe pain and had to sit back down. The pain is a dull ache labeled 8/10. He generally takes Orencia for his rheumatoid arthritis but he's having a lot of stiffness in spite of that. Over the last 2 days he's been resting of laying in bed all day yesterday but his pain is no better today. He had a hydrocodone left over from an elbow surgery and take that all he did was make him sleepy. The pain is located at the lower back across the midline. It does not radiate into the legs. It is constant, steady since onset.   History Ronald Cameron has a past medical history of Collagen vascular disease ([place]) and Rheumatoid arthritis ([place]).   He has a past surgical history that includes Tonsillectomy; Elbow surgery; and Colonoscopy (N/A, 04/13/2016).   His family history includes Diabetes in his mother; Heart attack in his father.He reports that he quit smoking about 2 years ago. His smoking use included Cigarettes. He started smoking about 40 years ago. He has a 39.00 pack-year smoking history. He has never used smokeless tobacco. He reports that he does not drink alcohol or use drugs.    ROS Review of Systems  Constitutional: Negative for chills, diaphoresis and fever.  HENT: Negative for sore throat.   Cardiovascular: Negative for chest pain.  Gastrointestinal: Negative for abdominal pain.  Musculoskeletal: Positive for arthralgias, back pain, gait problem and myalgias. Negative for neck pain.  Skin: Negative for rash.  Neurological: Positive for weakness. Negative for numbness.    Objective:  BP 109/72 (BP Location: Left Arm, Patient Position: Sitting, Cuff Size: Large)   Pulse 72   Temp  97.8 F (36.6 C) (Oral)   Ht 5\' 11"  (1.803 m)   Wt 224 lb (101.6 kg)   SpO2 97%   BMI 31.24 kg/m   BP Readings from Last 3 Encounters:  05/18/16 109/72  05/06/16 128/73  04/13/16 108/78    Wt Readings from Last 3 Encounters:  05/18/16 224 lb (101.6 kg)  05/06/16 220 lb 3.2 oz (99.9 kg)  04/13/16 221 lb (100.2 kg)     Physical Exam  Constitutional: He is oriented to person, place, and time. He appears well-developed and well-nourished. He appears distressed.  HENT:  Head: Normocephalic.  Eyes: Pupils are equal, round, and reactive to light.  Neck: Normal range of motion.  Cardiovascular: Normal rate, regular rhythm and normal heart sounds.   No murmur heard. Pulmonary/Chest: Effort normal and breath sounds normal.  Abdominal: There is no tenderness.  Musculoskeletal: He exhibits tenderness.  Neurological: He is alert and oriented to person, place, and time. He has normal reflexes.  Skin: Skin is warm and dry.  Psychiatric: His behavior is normal. Thought content normal.  Vitals reviewed.    Lab Results  Component Value Date   WBC 10.6 (H) 07/03/2007   HGB 13.6 07/03/2007   HCT 39.3 07/03/2007   PLT 366 07/03/2007   CHOL 228 (H) 03/27/2015   TRIG 49 03/27/2015   HDL 41 03/27/2015   LDLCALC 177 (H) 03/27/2015   ALT 20 03/27/2015   AST 14 03/27/2015   HGBA1C 5.4 03/27/2015  US Renal  Result Date: 05/11/2016 CLINICAL DATA:  Follow-up renal lesion.  Abnormal CT. EXAM: RENAL / URINARY TRACT ULTRASOUND COMPLETE COMPARISON:  CT 03/02/2015. FINDINGS: Right Kidney: Length: 12.0 some. Echogenicity within normal limits. No mass or hydronephrosis visualized. Left Kidney: Length: 12.3 cm. Echogenicity within normal limits. No hydronephrosis visualized. 1.3 x 1.9 x 1.1 cm simple cyst left kidney corresponds CT abnormality. Nonobstructing renal calyceal stones versus vascular calcification noted. Bladder: Appears normal for degree of bladder distention. IMPRESSION: 1.  1.3 cm  simple cyst left kidney corresponds CT abnormality. 2. Nonobstructing calyceal stones versus vascular calcifications left kidney. Electronically Signed   By: Marcello Moores  Register   On: 05/11/2016 15:35    Assessment & Plan:   Malosi was seen today for back pain.  Diagnoses and all orders for this visit:  Bilateral low back pain without sciatica -     betamethasone acetate-betamethasone sodium phosphate (CELESTONE) injection 6 mg; Inject 1 mL (6 mg total) into the muscle once. -     ketorolac (TORADOL) injection 60 mg; Inject 2 mLs (60 mg total) into the muscle once.  Rheumatoid arthritis involving multiple sites with positive rheumatoid factor (HCC)  Other orders -     cyclobenzaprine (FLEXERIL) 10 MG tablet; Take 1 tablet (10 mg total) by mouth 3 (three) times daily as needed for muscle spasms. -     HYDROcodone-acetaminophen (NORCO) 5-325 MG tablet; Take 1 tablet by mouth every 6 (six) hours as needed for moderate pain.      I have discontinued Ronald Cameron metroNIDAZOLE. I am also having him start on cyclobenzaprine and HYDROcodone-acetaminophen. Additionally, I am having him maintain his methotrexate, predniSONE, aspirin, vitamin C, traZODone, calcium carbonate, Abatacept, folic acid, and ciprofloxacin. We will continue to administer betamethasone acetate-betamethasone sodium phosphate and ketorolac.  Meds ordered this encounter  Medications  . betamethasone acetate-betamethasone sodium phosphate (CELESTONE) injection 6 mg  . ketorolac (TORADOL) injection 60 mg  . cyclobenzaprine (FLEXERIL) 10 MG tablet    Sig: Take 1 tablet (10 mg total) by mouth 3 (three) times daily as needed for muscle spasms.    Dispense:  90 tablet    Refill:  1  . HYDROcodone-acetaminophen (NORCO) 5-325 MG tablet    Sig: Take 1 tablet by mouth every 6 (six) hours as needed for moderate pain.    Dispense:  20 tablet    Refill:  0     Follow-up: Return in about 2 weeks (around 06/01/2016), or if  symptoms worsen or fail to improve.  Claretta Fraise, M.D.

## 2016-07-11 ENCOUNTER — Encounter: Payer: Self-pay | Admitting: Internal Medicine

## 2016-07-18 ENCOUNTER — Encounter: Payer: Self-pay | Admitting: Family Medicine

## 2016-07-18 ENCOUNTER — Ambulatory Visit (INDEPENDENT_AMBULATORY_CARE_PROVIDER_SITE_OTHER): Payer: Managed Care, Other (non HMO) | Admitting: Family Medicine

## 2016-07-18 VITALS — BP 121/74 | HR 85 | Temp 98.4°F | Ht 71.0 in | Wt 221.5 lb

## 2016-07-18 DIAGNOSIS — J40 Bronchitis, not specified as acute or chronic: Secondary | ICD-10-CM

## 2016-07-18 DIAGNOSIS — J329 Chronic sinusitis, unspecified: Secondary | ICD-10-CM | POA: Diagnosis not present

## 2016-07-18 DIAGNOSIS — M0579 Rheumatoid arthritis with rheumatoid factor of multiple sites without organ or systems involvement: Secondary | ICD-10-CM | POA: Diagnosis not present

## 2016-07-18 MED ORDER — CEFTRIAXONE SODIUM 1 G IJ SOLR
1.0000 g | Freq: Once | INTRAMUSCULAR | Status: AC
  Administered 2016-07-18: 1 g via INTRAMUSCULAR

## 2016-07-18 MED ORDER — HYDROCODONE-HOMATROPINE 5-1.5 MG/5ML PO SYRP: 5.0000 mL | ORAL_SOLUTION | Freq: Four times a day (QID) | ORAL | 0 refills | Status: DC | PRN

## 2016-07-18 MED ORDER — LEVOFLOXACIN 500 MG PO TABS: 500.0000 mg | ORAL_TABLET | Freq: Every day | ORAL | 0 refills | Status: DC

## 2016-07-18 MED ORDER — PSEUDOEPHEDRINE-GUAIFENESIN ER 120-1200 MG PO TB12: 1.0000 | ORAL_TABLET | Freq: Two times a day (BID) | ORAL | 0 refills | Status: DC

## 2016-07-18 NOTE — Progress Notes (Signed)
Subjective:  Patient ID: Ronald Cameron, male    DOB: [birth year]  Age: 60 y.o. MRN: [id number]  CC: Sinusitis (pt here today c/o cough, runny nose, sore throat, sinus pressure.)   HPI Ronald Cameron presents for Symptoms include congestion, facial pain, nasal congestioWith purulent rhinorrhea n, no  fever,clear productive cough, post nasal drip and sinus pressure with fever subjectively as well as chills alternating with sweats. Onset of symptoms was a 5 days ago, gradually worsening since that time. Pt.is taking multiple immune suppressing drugs due to rheumatoid arthritis. Last dose of Orencia yesterday next dose of methotrexate tomorrow. History Desire has a past medical history of Collagen vascular disease ([place]) and Rheumatoid arthritis ([place]).   He has a past surgical history that includes Tonsillectomy; Elbow surgery; and Colonoscopy (N/A, 04/13/2016).   His family history includes Diabetes in his mother; Heart attack in his father.He reports that he quit smoking about 2 years ago. His smoking use included Cigarettes. He started smoking about 41 years ago. He has a 39.00 pack-year smoking history. He has never used smokeless tobacco. He reports that he does not drink alcohol or use drugs.  Current Outpatient Prescriptions on File Prior to Visit  Medication Sig Dispense Refill  . Abatacept (ORENCIA) 125 MG/ML SOSY Inject into the skin every Sunday.     . Ascorbic Acid (VITAMIN C) 1000 MG tablet Take 1,000 mg by mouth daily.    Marland Kitchen aspirin 81 MG tablet Take 81 mg by mouth daily.    . calcium carbonate (OS-CAL) 600 MG TABS tablet Take 600 mg by mouth daily with breakfast.    . cyclobenzaprine (FLEXERIL) 10 MG tablet Take 1 tablet (10 mg total) by mouth 3 (three) times daily as needed for muscle spasms. 90 tablet 1  . folic acid (FOLVITE) 1 MG tablet Take 1 mg by mouth daily.    Marland Kitchen HYDROcodone-acetaminophen (NORCO) 5-325 MG tablet Take 1 tablet by mouth every 6 (six) hours as  needed for moderate pain. 20 tablet 0  . methotrexate (RHEUMATREX) 2.5 MG tablet Take 25 mg by mouth every Sunday. Pt takes 10 tablets weekly. Caution:Chemotherapy. Protect from light.    . predniSONE (DELTASONE) 5 MG tablet Take 5 mg by mouth 2 (two) times daily with a meal.     . traZODone (DESYREL) 100 MG tablet Take 100 mg by mouth at bedtime as needed for sleep.     No current facility-administered medications on file prior to visit.     ROS Review of Systems  Constitutional: Negative for activity change, appetite change, chills and fever.  HENT: Positive for congestion, postnasal drip, rhinorrhea and sinus pressure. Negative for ear discharge, ear pain, hearing loss, nosebleeds, sneezing and trouble swallowing.   Respiratory: Negative for chest tightness and shortness of breath.   Cardiovascular: Negative for chest pain and palpitations.  Skin: Negative for rash.    Objective:  BP 121/74   Pulse 85   Temp 98.4 F (36.9 C) (Oral)   Ht 5\' 11"  (1.803 m)   Wt 221 lb 8 oz (100.5 kg)   BMI 30.89 kg/m   Physical Exam  Constitutional: He appears well-developed and well-nourished.  HENT:  Head: Normocephalic and atraumatic.  Right Ear: Tympanic membrane and external ear normal. No decreased hearing is noted.  Left Ear: Tympanic membrane and external ear normal. No decreased hearing is noted.  Nose: Mucosal edema present. Right sinus exhibits no frontal sinus tenderness. Left sinus exhibits no frontal sinus tenderness.  Mouth/Throat:  No oropharyngeal exudate or posterior oropharyngeal erythema.  Neck: No Brudzinski's sign noted.  Pulmonary/Chest: Breath sounds normal. No respiratory distress.  Lymphadenopathy:       Head (right side): No preauricular adenopathy present.       Head (left side): No preauricular adenopathy present.       Right cervical: No superficial cervical adenopathy present.      Left cervical: No superficial cervical adenopathy present.    Assessment &  Plan:   Wyett was seen today for sinusitis.  Diagnoses and all orders for this visit:  Sinobronchitis -     cefTRIAXone (ROCEPHIN) injection 1 g; Inject 1 g into the muscle once.  Rheumatoid arthritis involving multiple sites with positive rheumatoid factor (HCC)  Other orders -     levofloxacin (LEVAQUIN) 500 MG tablet; Take 1 tablet (500 mg total) by mouth daily. For 10 days -     HYDROcodone-homatropine (HYCODAN) 5-1.5 MG/5ML syrup; Take 5 mLs by mouth every 6 (six) hours as needed for cough. -     Pseudoephedrine-Guaifenesin [phone] MG TB12; Take 1 tablet by mouth 2 (two) times daily. For congestion   I have discontinued Mr. Solecki's ciprofloxacin. I am also having him start on levofloxacin, HYDROcodone-homatropine, and Pseudoephedrine-Guaifenesin. Additionally, I am having him maintain his methotrexate, predniSONE, aspirin, vitamin C, traZODone, calcium carbonate, Abatacept, folic acid, cyclobenzaprine, and HYDROcodone-acetaminophen. We will continue to administer cefTRIAXone.  Meds ordered this encounter  Medications  . cefTRIAXone (ROCEPHIN) injection 1 g  . levofloxacin (LEVAQUIN) 500 MG tablet    Sig: Take 1 tablet (500 mg total) by mouth daily. For 10 days    Dispense:  10 tablet    Refill:  0  . HYDROcodone-homatropine (HYCODAN) 5-1.5 MG/5ML syrup    Sig: Take 5 mLs by mouth every 6 (six) hours as needed for cough.    Dispense:  120 mL    Refill:  0  . Pseudoephedrine-Guaifenesin [phone] MG TB12    Sig: Take 1 tablet by mouth 2 (two) times daily. For congestion    Dispense:  20 each    Refill:  0   Do not take methotrexate tomorrow, October 24. Please notify your rheumatologist of this.  Follow-up: Return if symptoms worsen or fail to improve.  Claretta Fraise, M.D.

## 2016-07-18 NOTE — Patient Instructions (Signed)
Do not take methotrexate tomorrow, October 24. Please notify your rheumatologist of this.

## 2016-08-14 ENCOUNTER — Emergency Department (HOSPITAL_COMMUNITY)
Admission: EM | Admit: 2016-08-14 | Discharge: 2016-08-14 | Disposition: A | Discharge status: Home / Self Care | Payer: Managed Care, Other (non HMO) | Attending: Emergency Medicine | Admitting: Emergency Medicine

## 2016-08-14 ENCOUNTER — Encounter (HOSPITAL_COMMUNITY): Payer: Self-pay

## 2016-08-14 ENCOUNTER — Emergency Department (HOSPITAL_COMMUNITY): Payer: Managed Care, Other (non HMO)

## 2016-08-14 DIAGNOSIS — Z87891 Personal history of nicotine dependence: Secondary | ICD-10-CM | POA: Insufficient documentation

## 2016-08-14 DIAGNOSIS — W11XXXA Fall on and from ladder, initial encounter: Secondary | ICD-10-CM | POA: Diagnosis not present

## 2016-08-14 DIAGNOSIS — Y939 Activity, unspecified: Secondary | ICD-10-CM | POA: Insufficient documentation

## 2016-08-14 DIAGNOSIS — Z7982 Long term (current) use of aspirin: Secondary | ICD-10-CM | POA: Diagnosis not present

## 2016-08-14 DIAGNOSIS — Y929 Unspecified place or not applicable: Secondary | ICD-10-CM | POA: Diagnosis not present

## 2016-08-14 DIAGNOSIS — S51011A Laceration without foreign body of right elbow, initial encounter: Secondary | ICD-10-CM | POA: Diagnosis not present

## 2016-08-14 DIAGNOSIS — M25511 Pain in right shoulder: Secondary | ICD-10-CM | POA: Insufficient documentation

## 2016-08-14 DIAGNOSIS — Y999 Unspecified external cause status: Secondary | ICD-10-CM | POA: Diagnosis not present

## 2016-08-14 DIAGNOSIS — W19XXXA Unspecified fall, initial encounter: Secondary | ICD-10-CM

## 2016-08-14 DIAGNOSIS — M25521 Pain in right elbow: Secondary | ICD-10-CM

## 2016-08-14 DIAGNOSIS — S59901A Unspecified injury of right elbow, initial encounter: Secondary | ICD-10-CM | POA: Diagnosis present

## 2016-08-14 MED ORDER — HYDROCODONE-ACETAMINOPHEN 5-325 MG PO TABS: 1.0000 | ORAL_TABLET | Freq: Four times a day (QID) | ORAL | 0 refills | Status: DC | PRN

## 2016-08-14 MED ORDER — BACITRACIN ZINC 500 UNIT/GM EX OINT
TOPICAL_OINTMENT | Freq: Every day | CUTANEOUS | Status: DC
  Filled 2016-08-14: qty 0.9

## 2016-08-14 NOTE — ED Provider Notes (Signed)
[facility] DEPT Provider Note   CSN: [id number] Arrival date & time: 08/14/16  1702     History   Chief Complaint Chief Complaint  Patient presents with  . Shoulder Pain  . Fall  . Elbow Injury    HPI Ronald Cameron is a 60 y.o. male.  Patient presents to the ED with a chief complaint of fall.  He states that he missed a step and fell from a 3 step ladder and landed on the ground striking his right elbow and shoulder. He reports moderate pain.  It is worsened with movement and palpation.  He states that he bumped his head on the closet door, but did not pass out.  He has not taken anything for the symptoms.  There are no other associated symptoms.   The history is provided by the patient. No language interpreter was used.    Past Medical History:  Diagnosis Date  . Collagen vascular disease (HCC)    Rheumatoid Arthritis  . Rheumatoid arthritis [facility])     Patient Active Problem List   Diagnosis Date Noted  . Renal lesion 05/06/2016  . History of colonic polyps   . Diverticulosis of colon without hemorrhage   . Bloating 04/08/2016  . Encounter for screening colonoscopy 04/08/2016  . Diverticulitis of colon without hemorrhage 04/06/2015  . Fatty liver 04/06/2015  . Early satiety 02/20/2015  . Abdominal pain, epigastric 02/20/2015  . Abdominal swelling, generalized 02/20/2015  . High risk medication use 02/20/2015  . Septic olecranon bursitis of right elbow 12/16/2013  . Pain in elbow 12/16/2013  . EXTERNAL HEMORRHOIDS 12/18/2008  . GERD 12/18/2008  . NEPHROLITHIASIS, HX OF 12/18/2008  . Rheumatoid arthritis ([place]) 12/17/2008  . Hemorrhoids 12/15/2008  . RECTAL BLEEDING 12/15/2008  . RECTAL PAIN 12/15/2008  . Lower abdominal pain 12/15/2008    Past Surgical History:  Procedure Laterality Date  . COLONOSCOPY N/A 04/13/2016   Dr. [name]: 14 mm polyp in the ascending colon removed, diverticulosis in the distal descending colon and sigmoid colon,  nonbleeding internal hemorrhoids. Pathology-tubular adenoma. Next colonoscopy 5 years.  . ELBOW SURGERY     X 3 last year. Staph infection but not MRSA per patient. infectious disease. out of work 6 months.   . TONSILLECTOMY         Home Medications    Prior to Admission medications   Medication Sig Start Date End Date Taking? Authorizing Provider  Abatacept Maureen Chatters) 125 MG/ML SOSY Inject into the skin every Sunday.     Historical Provider, MD  Ascorbic Acid (VITAMIN C) 1000 MG tablet Take 1,000 mg by mouth daily.    Historical Provider, MD  aspirin 81 MG tablet Take 81 mg by mouth daily.    Historical Provider, MD  calcium carbonate (OS-CAL) 600 MG TABS tablet Take 600 mg by mouth daily with breakfast.    Historical Provider, MD  cyclobenzaprine (FLEXERIL) 10 MG tablet Take 1 tablet (10 mg total) by mouth 3 (three) times daily as needed for muscle spasms. 05/18/16   Claretta Fraise, MD  folic acid (FOLVITE) 1 MG tablet Take 1 mg by mouth daily.    Historical Provider, MD  HYDROcodone-acetaminophen (NORCO) 5-325 MG tablet Take 1 tablet by mouth every 6 (six) hours as needed for moderate pain. 05/18/16   Claretta Fraise, MD  HYDROcodone-homatropine [facility]) 5-1.5 MG/5ML syrup Take 5 mLs by mouth every 6 (six) hours as needed for cough. 07/18/16   Claretta Fraise, MD  levofloxacin (LEVAQUIN) 500 MG tablet Take  1 tablet (500 mg total) by mouth daily. For 10 days 07/18/16   Claretta Fraise, MD  methotrexate (RHEUMATREX) 2.5 MG tablet Take 25 mg by mouth every Sunday. Pt takes 10 tablets weekly. Caution:Chemotherapy. Protect from light.    Historical Provider, MD  predniSONE (DELTASONE) 5 MG tablet Take 5 mg by mouth 2 (two) times daily with a meal.     Historical Provider, MD  Pseudoephedrine-Guaifenesin [phone] MG TB12 Take 1 tablet by mouth 2 (two) times daily. For congestion 07/18/16   Claretta Fraise, MD  traZODone (DESYREL) 100 MG tablet Take 100 mg by mouth at bedtime as needed for sleep.     Historical Provider, MD    Family History Family History  Problem Relation Age of Onset  . Heart attack Father   . Diabetes Mother   . Colon cancer Neg Hx   . Liver disease Neg Hx     Social History Social History  Substance Use Topics  . Smoking status: Former Smoker    Packs/day: 1.00    Years: 39.00    Types: Cigarettes    Start date: 07/11/1975    Quit date: 04/26/2014  . Smokeless tobacco: Never Used     Comment: Quit x 3 years  . Alcohol use No     Allergies   Patient has no known allergies.   Review of Systems Review of Systems  Musculoskeletal: Positive for arthralgias.  All other systems reviewed and are negative.    Physical Exam Updated Vital Signs BP 145/78 (BP Location: Left Arm)   Pulse 84   Temp 97.7 F (36.5 C) (Oral)   Resp 16   Ht 5\' 11"  (1.803 m)   Wt 102.1 kg   SpO2 99%   BMI 31.38 kg/m   Physical Exam  Constitutional: He is oriented to person, place, and time. He appears well-developed and well-nourished.  HENT:  Head: Normocephalic and atraumatic.  Eyes: Conjunctivae and EOM are normal. Pupils are equal, round, and reactive to light. Right eye exhibits no discharge. Left eye exhibits no discharge. No scleral icterus.  Neck: Normal range of motion. Neck supple. No JVD present.  Cardiovascular: Normal rate, regular rhythm and normal heart sounds.  Exam reveals no gallop and no friction rub.   No murmur heard. Pulmonary/Chest: Effort normal and breath sounds normal. No respiratory distress. He has no wheezes. He has no rales. He exhibits no tenderness.  Abdominal: Soft. He exhibits no distension and no mass. There is no tenderness. There is no rebound and no guarding.  Musculoskeletal: Normal range of motion. He exhibits no edema.  Right shoulder ROM and strength 5/5, but painful, no bony abnormality or deformity Right elbow ROM and strength 5/5, no bony abnormality or deformity  Neurological: He is alert and oriented to person, place,  and time.  Skin: Skin is warm and dry.  Mild skin tear to right elbow  Psychiatric: He has a normal mood and affect. His behavior is normal. Judgment and thought content normal.  Nursing note and vitals reviewed.    ED Treatments / Results  Labs (all labs ordered are listed, but only abnormal results are displayed) Labs Reviewed - No data to display  EKG  EKG Interpretation None       Radiology Dg Shoulder Right  Result Date: 08/14/2016 CLINICAL DATA:  60 year old male status post fall from ladder today with pain. Initial encounter. EXAM: RIGHT SHOULDER - 2+ VIEW COMPARISON:  Chest radiographs 10/18/2003. FINDINGS: *INSERT* 1 humeral proximal right humerus  appears intact. Visible right clavicle intact. No scapula fracture is evident. Visible right ribs appear intact. IMPRESSION: No acute fracture or dislocation identified about the right shoulder. Electronically Signed   By: Genevie Ann M.D.   On: 08/14/2016 18:00   Dg Elbow Complete Right  Result Date: 08/14/2016 CLINICAL DATA:  60 year old male status post fall from ladder this morning with pain. Initial encounter. EXAM: RIGHT ELBOW - COMPLETE 3+ VIEW COMPARISON:  Right elbow series 12/16/13 and earlier. FINDINGS: Stable appearance of the proximal right radius including chronic bony overgrowth near the level of the biceps insertion which might be posttraumatic or degenerative. The radial head appears intact. No evidence of right elbow joint effusion. Joint spaces and alignment are stable and within normal limits. Distal right humerus and proximal right ulna appear intact. IMPRESSION: No acute fracture or dislocation identified about the right elbow. Electronically Signed   By: Genevie Ann M.D.   On: 08/14/2016 18:02    Procedures Procedures (including critical care time)  Medications Ordered in ED Medications - No data to display   Initial Impression / Assessment and Plan / ED Course  I have reviewed the triage vital signs and the  nursing notes.  Pertinent labs & imaging results that were available during my care of the patient were reviewed by me and considered in my medical decision making (see chart for details).  Clinical Course     Patient will fall from small step ladder.  Imaging is negative.  Will give sling for comfort.  PCP follow-up.  Final Clinical Impressions(s) / ED Diagnoses   Final diagnoses:  Fall, initial encounter  Acute pain of right shoulder  Right elbow pain    New Prescriptions New Prescriptions   HYDROCODONE-ACETAMINOPHEN (NORCO/VICODIN) 5-325 MG TABLET    Take 1-2 tablets by mouth every 6 (six) hours as needed.     Montine Circle, PA-C 08/14/16 1838    Duffy Bruce, MD 08/16/16 [phone]

## 2016-08-14 NOTE — ED Triage Notes (Signed)
Patient fell from a ladder (3 steps) and landed on his right shoulder. No deformity, but pain with movement. Patient also has a skin tear to the right elbow. Patient states he hit his head on a closet door.

## 2016-11-17 ENCOUNTER — Encounter: Payer: Self-pay | Admitting: Nurse Practitioner

## 2016-11-17 ENCOUNTER — Ambulatory Visit (INDEPENDENT_AMBULATORY_CARE_PROVIDER_SITE_OTHER): Payer: BLUE CROSS/BLUE SHIELD | Admitting: Nurse Practitioner

## 2016-11-17 DIAGNOSIS — A09 Infectious gastroenteritis and colitis, unspecified: Secondary | ICD-10-CM

## 2016-11-17 DIAGNOSIS — R197 Diarrhea, unspecified: Secondary | ICD-10-CM | POA: Insufficient documentation

## 2016-11-17 DIAGNOSIS — R768 Other specified abnormal immunological findings in serum: Secondary | ICD-10-CM | POA: Diagnosis not present

## 2016-11-17 MED ORDER — DICYCLOMINE HCL 10 MG PO CAPS: 10.0000 mg | ORAL_CAPSULE | Freq: Three times a day (TID) | ORAL | 0 refills | Status: DC

## 2016-11-17 NOTE — Progress Notes (Signed)
Referring Provider: Claretta Fraise, MD Primary Care Physician:  Claretta Fraise, MD Primary GI:  Dr. [name]  Chief Complaint  Patient presents with  . Hepatitis C    HPI:   Ronald Cameron is a 61 y.o. male who presents for visit related to hepatitis C. The patient was initially seen in our office on 04/08/2016 for hemorrhoids, bloating, screening colonoscopy, and fatty liver. He has a history of abdominal bloating in the setting of diverticulosis cannot rule out diverticulitis but when he was treated with Cipro and Flagyl distress resolved. Had never had a colonoscopy until that point. At that time he is having 4 bowel movements daily which were soft but not loose, increasing abdominal girth, rectal pain for hemorrhoids. Noted weight gain of 20 pounds in the previous 2 years. He was arranged for colonoscopy, recommended attempted weight loss, instructions for fatty liver provided, query hemorrhoid banding candidate.  Colonoscopy was completed 04/13/2016 and found a 4 mm polyp in the ascending colon, diverticulosis, nonbleeding internal hemorrhoids. Recommended continue present medications, patient reasonably good hemorrhoid banding candidate. Polyp found to be tubular adenoma and recommended 5 year repeat exam.  Follow-up office visit 05/06/2016 for lower abdominal pain increasing over the previous 2 weeks with 2-3 bowel movements a day, continued bloating with extensive evaluation in the previous 12 months with ultrasound, CT scan, colonoscopy. History of indeterminate have hepatitis B core IgM at rheumatology. Outside labs dated 06/16/2015 showed hepatitis C virus antibody less than 0.1, hepatitis A IgM negative, hepatitis B surface antigen screen negative, hepatitis B core antibody, IgM indeterminate. The patient declined hemorrhoid banding at that time. Renal ultrasound to follow-up on renal lesion was ordered. Patient was given empiric Cipro/Flagyl for possible diverticulitis due to  history. Recommended follow-up hepatitis labs with rheumatology.  PCP notes reviewed which document his course of treatment for rheumatoid arthritis and multiple treatments tried. Labs include CBC, CMP (which is normal from a liver function standpoint) QuantiFERON. No labs to indicate a new diagnosis of hepatitis C. No labs in our system to document positive hepatitis C antibody.  Today he states he isn't sure why he was sent to [place]. His HCV is negative. He was told it has to do with abnormal Hepatitis B labs. His RA medications have been discontinued because of this and they asked who his normal GI is and was referred to [place]. I explained that most standard GI practices don't get much into Hepatitis B. I called Roosevelt Locks, NP at the [facility] in [place], explained the situation and she is willing to make him an appointment for evaluation.  He is asking to be seen for GI distress. His bowels have changed. Is having a bowel movement 4-5 times a day, orange tint. Not loose but soft. Did have watery stool when he had the flu, but this has now progressed to soft stools. Has some "slime" as well. This has been going on for "a while." Did have the flu, which is when the majority of his symptoms started. Was not officially tested for the flu and states "it was nasty, I had a 100.6 temperature." No abdominal pain "just not right." Denies hematochezia, current fever/chills, unintentional weight loss. Appetite has been "not good" but will eat cause he knows he needs to, but no desire for food intake. Denies any other upper or lower GI symptoms.  Past Medical History:  Diagnosis Date  . Collagen vascular disease (HCC)    Rheumatoid Arthritis  . Rheumatoid arthritis ([place])  Past Surgical History:  Procedure Laterality Date  . COLONOSCOPY N/A 04/13/2016   Dr. [name]: 14 mm polyp in the ascending colon removed, diverticulosis in the distal descending colon and sigmoid colon, nonbleeding internal  hemorrhoids. Pathology-tubular adenoma. Next colonoscopy 5 years.  . ELBOW SURGERY     X 3 last year. Staph infection but not MRSA per patient. infectious disease. out of work 6 months.   . TONSILLECTOMY      Current Outpatient Prescriptions  Medication Sig Dispense Refill  . Abatacept (ORENCIA) 125 MG/ML SOSY Inject into the skin every Sunday.     . Ascorbic Acid (VITAMIN C) 1000 MG tablet Take 1,000 mg by mouth daily.    Marland Kitchen aspirin 81 MG tablet Take 81 mg by mouth daily.    . calcium carbonate (OS-CAL) 600 MG TABS tablet Take 600 mg by mouth daily with breakfast.    . folic acid (FOLVITE) 1 MG tablet Take 1 mg by mouth daily.    . methotrexate (RHEUMATREX) 2.5 MG tablet Take 25 mg by mouth every Sunday. Pt takes 10 tablets weekly. Caution:Chemotherapy. Protect from light.    . traZODone (DESYREL) 100 MG tablet Take 100 mg by mouth at bedtime as needed for sleep.    . predniSONE (DELTASONE) 5 MG tablet Take 5 mg by mouth 2 (two) times daily with a meal.      No current facility-administered medications for this visit.     Allergies as of 11/17/2016  . (No Known Allergies)    Family History  Problem Relation Age of Onset  . Heart attack Father   . Diabetes Mother   . Colon cancer Neg Hx   . Liver disease Neg Hx     Social History   Social History  . Marital status: Married    Spouse name: N/A  . Number of children: 0  . Years of education: N/A   Occupational History  . Diesel tech    Social History Main Topics  . Smoking status: Former Smoker    Packs/day: 1.00    Years: 39.00    Types: Cigarettes    Start date: 07/11/1975    Quit date: 04/26/2014  . Smokeless tobacco: Never Used     Comment: Quit x 3 years  . Alcohol use No  . Drug use: No  . Sexual activity: Not Asked   Other Topics Concern  . None   Social History Narrative  . None    Review of Systems: General: Negative for anorexia, fever, chills, fatigue, weakness. ENT: Negative for hoarseness,  difficulty swallowing. CV: Negative for chest pain, angina, palpitations, peripheral edema.  Respiratory: Negative for dyspnea at rest, dyspnea on exertion, cough, sputum, wheezing.  GI: See history of present illness.  Heme: Negative for bruising or bleeding.   Physical Exam: BP 113/70   Pulse 69   Temp 97.6 F (36.4 C) (Oral)   Ht 5\' 11"  (1.803 m)   Wt 221 lb 3.2 oz (100.3 kg)   BMI 30.85 kg/m  General:   Obese male. Alert and oriented. Pleasant and cooperative. Well-nourished and well-developed.  Head:  Normocephalic and atraumatic. Eyes:  Without icterus, sclera clear and conjunctiva pink.  Ears:  Normal auditory acuity. Cardiovascular:  S1, S2 present without murmurs appreciated. Extremities without clubbing or edema. Respiratory:  Clear to auscultation bilaterally. No wheezes, rales, or rhonchi. No distress.  Gastrointestinal:  + hyperactive BS, rounded but soft, non-tender and non-distended. No HSM noted. No guarding or rebound. No masses appreciated.  Rectal:  Deferred  Neurologic:  Alert and oriented x4;  grossly normal neurologically. Psych:  Alert and cooperative. Normal mood and affect. Heme/Lymph/Immune: No excessive bruising noted.    11/17/2016 10:01 AM   Disclaimer: This note was dictated with voice recognition software. Similar sounding words can inadvertently be transcribed and may not be corrected upon review.

## 2016-11-17 NOTE — Progress Notes (Signed)
CC'ED TO PCP 

## 2016-11-17 NOTE — Assessment & Plan Note (Signed)
The patient states he recently had the flu, although he was not tested. Unsure if actual influenza or some other viral illness. At that time he was having loose stools which have now resolved to a soft/pasty. 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 I will start him on a probiotic for 30 days to see how he does. I will send in Bentyl 4 times a day as needed for loose/soft stools and/or abdominal pain. Return for follow-up in 2 months.

## 2016-11-17 NOTE — Patient Instructions (Signed)
1. Take a probiotic for 30 days. I'm giving the samples to last 10 days and a coupon to reduce the over-the-counter cost. 2. I've sent and Bentyl 10 mg to your pharmacy. Take this 3 times a day with meals and at bedtime, as needed for abdominal pain and/or loose to soft stools. 3. Return for follow-up in 2 months. 4. You should hear from the [facility] liver clinic shortly to schedule an appointment.

## 2016-11-17 NOTE — Assessment & Plan Note (Signed)
The patient had abnormal hepatitis B serologies with a negative hepatitis B surface antigen, positive hepatitis B core antibody, and negative hep Titus B surface antibody. This is an unclear interpretation requiring further workup. We do not treat hepatitis B here. I have spoken with Roosevelt Locks, NP at the [facility] in [place] who states that she will see him. I will fax my office note today as well as his cover sheet to her sick and schedule an appointment. Patient is agreeable to this.

## 2016-11-17 NOTE — Addendum Note (Signed)
Addended by: Gordy Levan, ERIC A on: 11/17/2016 01:04 PM   Modules accepted: Orders

## 2016-11-22 ENCOUNTER — Telehealth: Payer: Self-pay

## 2016-11-22 ENCOUNTER — Telehealth: Payer: Self-pay | Admitting: Cardiology

## 2016-11-22 NOTE — Telephone Encounter (Signed)
Pt called office and said he hasn't heard anything from the liver clinic and wanted to know what was going on.  Routing to [employer].

## 2016-11-22 NOTE — Telephone Encounter (Signed)
Numerous attempts to contact patient with recall letters. Unable to reach by telephone. with no success.    Weston Anna [id number] 05/07/2015 4:05 PM New [10]   [System] 01/25/2016 11:00 PM Notification Sent [20]   Chanda Busing [id number] 06/15/2016 2:50 PM Notification Sent [20]   Weston Anna [id number] 09/15/2016 8:10 AM Notification Sent [20]   Chanda Busing [id number] 11/22/2016 3:06 PM Notification Sent [20]  Scheduling Instructions  1 yr fu

## 2016-11-28 NOTE — Telephone Encounter (Signed)
Can we call them and see where they're at in the process?

## 2016-11-28 NOTE — Telephone Encounter (Signed)
Called [facility]. His info was received and they will be contacting pt to schedule appt. They will fax our office when appt is made.

## 2016-11-28 NOTE — Telephone Encounter (Signed)
Called and informed pt.  

## 2016-12-01 NOTE — Telephone Encounter (Signed)
Received fax from [facility]. Pt has appt 12/09/16 at 10:00am.

## 2016-12-04 ENCOUNTER — Emergency Department (HOSPITAL_COMMUNITY): Payer: Worker's Compensation

## 2016-12-04 ENCOUNTER — Encounter (HOSPITAL_COMMUNITY): Payer: Self-pay | Admitting: Emergency Medicine

## 2016-12-04 ENCOUNTER — Emergency Department (HOSPITAL_COMMUNITY)
Admission: EM | Admit: 2016-12-04 | Discharge: 2016-12-04 | Disposition: A | Discharge status: Home / Self Care | Payer: Worker's Compensation | Attending: Emergency Medicine | Admitting: Emergency Medicine

## 2016-12-04 DIAGNOSIS — Z79899 Other long term (current) drug therapy: Secondary | ICD-10-CM | POA: Insufficient documentation

## 2016-12-04 DIAGNOSIS — Z7982 Long term (current) use of aspirin: Secondary | ICD-10-CM | POA: Diagnosis not present

## 2016-12-04 DIAGNOSIS — S0990XA Unspecified injury of head, initial encounter: Secondary | ICD-10-CM

## 2016-12-04 DIAGNOSIS — Y939 Activity, unspecified: Secondary | ICD-10-CM | POA: Insufficient documentation

## 2016-12-04 DIAGNOSIS — Y990 Civilian activity done for income or pay: Secondary | ICD-10-CM | POA: Diagnosis not present

## 2016-12-04 DIAGNOSIS — W268XXA Contact with other sharp object(s), not elsewhere classified, initial encounter: Secondary | ICD-10-CM | POA: Insufficient documentation

## 2016-12-04 DIAGNOSIS — Y929 Unspecified place or not applicable: Secondary | ICD-10-CM | POA: Insufficient documentation

## 2016-12-04 DIAGNOSIS — Z87891 Personal history of nicotine dependence: Secondary | ICD-10-CM | POA: Insufficient documentation

## 2016-12-04 LAB — CBC WITH DIFFERENTIAL/PLATELET
Basophils Absolute: 0.1 10*3/uL (ref 0.0–0.1)
Basophils Relative: 1 %
EOS ABS: 0.4 10*3/uL (ref 0.0–0.7)
Eosinophils Relative: 6 %
HCT: 41.7 % (ref 39.0–52.0)
Hemoglobin: 14.5 g/dL (ref 13.0–17.0)
LYMPHS ABS: 1.3 10*3/uL (ref 0.7–4.0)
Lymphocytes Relative: 18 %
MCH: 32.5 pg (ref 26.0–34.0)
MCHC: 34.8 g/dL (ref 30.0–36.0)
MCV: 93.5 fL (ref 78.0–100.0)
MONO ABS: 1.4 10*3/uL — AB (ref 0.1–1.0)
Monocytes Relative: 19 %
Neutro Abs: 4.1 10*3/uL (ref 1.7–7.7)
Neutrophils Relative %: 56 %
PLATELETS: 284 10*3/uL (ref 150–400)
RBC: 4.46 MIL/uL (ref 4.22–5.81)
RDW: 14.6 % (ref 11.5–15.5)
WBC: 7.3 10*3/uL (ref 4.0–10.5)

## 2016-12-04 LAB — COMPREHENSIVE METABOLIC PANEL
ALK PHOS: 41 U/L (ref 38–126)
ALT: 12 U/L — AB (ref 17–63)
AST: 16 U/L (ref 15–41)
Albumin: 3.2 g/dL — ABNORMAL LOW (ref 3.5–5.0)
Anion gap: 4 — ABNORMAL LOW (ref 5–15)
BUN: 15 mg/dL (ref 6–20)
CO2: 23 mmol/L (ref 22–32)
CREATININE: 1.1 mg/dL (ref 0.61–1.24)
Calcium: 8.3 mg/dL — ABNORMAL LOW (ref 8.9–10.3)
Chloride: 110 mmol/L (ref 101–111)
GFR calc non Af Amer: >60 mL/min (ref >60)
Glucose, Bld: 92 mg/dL (ref 65–99)
Potassium: 4 mmol/L (ref 3.5–5.1)
SODIUM: 137 mmol/L (ref 135–145)
Total Bilirubin: 0.5 mg/dL (ref 0.3–1.2)
Total Protein: 6.5 g/dL (ref 6.5–8.1)

## 2016-12-04 LAB — LIPASE, BLOOD: Lipase: 28 U/L (ref 11–51)

## 2016-12-04 MED ORDER — FENTANYL CITRATE (PF) 100 MCG/2ML IJ SOLN
50.0000 ug | Freq: Once | INTRAMUSCULAR | Status: AC
  Administered 2016-12-04: 50 ug via INTRAVENOUS
  Filled 2016-12-04: qty 2

## 2016-12-04 MED ORDER — KETOROLAC TROMETHAMINE 30 MG/ML IJ SOLN
10.0000 mg | Freq: Once | INTRAMUSCULAR | Status: AC
  Administered 2016-12-04: 9.9 mg via INTRAVENOUS
  Filled 2016-12-04: qty 1

## 2016-12-04 MED ORDER — OXYCODONE-ACETAMINOPHEN 5-325 MG PO TABS: 2.0000 | ORAL_TABLET | Freq: Three times a day (TID) | ORAL | 0 refills | Status: DC | PRN

## 2016-12-04 NOTE — ED Provider Notes (Signed)
[facility] DEPT Provider Note   CSN: [id number] Arrival date & time: 12/04/16  1102     History   Chief Complaint Chief Complaint  Patient presents with  . Head Injury    HPI Ronald Cameron is a 61 y.o. male.  HPI  Patient presents with concern of pain in his left posterior head. Patient had trauma 4 days ago, since that time has had substantial pain in this area, with new radiation down the left lateral neck. Pain is sharp, severe, worse with motion. No new vision changes, nausea, vomiting, weakness in his extremities. Patient has multiple medical issues, largely rheumatologic. Patient also has some concern of increasing abdominal distention, discomfort, with diminished oral intake, but with increasing abdominal girth. Patient has been referred to a hepatologist, but has not yet had his appointment.   Past Medical History:  Diagnosis Date  . Collagen vascular disease (HCC)    Rheumatoid Arthritis  . Rheumatoid arthritis [facility])     Patient Active Problem List   Diagnosis Date Noted  . Diarrhea 11/17/2016  . Abnormal hepatitis serology 11/17/2016  . Renal lesion 05/06/2016  . History of colonic polyps   . Diverticulosis of colon without hemorrhage   . Bloating 04/08/2016  . Encounter for screening colonoscopy 04/08/2016  . Diverticulitis of colon without hemorrhage 04/06/2015  . Fatty liver 04/06/2015  . Early satiety 02/20/2015  . Abdominal pain, epigastric 02/20/2015  . Abdominal swelling, generalized 02/20/2015  . High risk medication use 02/20/2015  . Septic olecranon bursitis of right elbow 12/16/2013  . Pain in elbow 12/16/2013  . EXTERNAL HEMORRHOIDS 12/18/2008  . GERD 12/18/2008  . NEPHROLITHIASIS, HX OF 12/18/2008  . Rheumatoid arthritis ([place]) 12/17/2008  . Hemorrhoids 12/15/2008  . RECTAL BLEEDING 12/15/2008  . RECTAL PAIN 12/15/2008  . Lower abdominal pain 12/15/2008    Past Surgical History:  Procedure Laterality Date  . COLONOSCOPY  N/A 04/13/2016   Dr. [name]: 14 mm polyp in the ascending colon removed, diverticulosis in the distal descending colon and sigmoid colon, nonbleeding internal hemorrhoids. Pathology-tubular adenoma. Next colonoscopy 5 years.  . ELBOW SURGERY     X 3 last year. Staph infection but not MRSA per patient. infectious disease. out of work 6 months.   . TONSILLECTOMY         Home Medications    Prior to Admission medications   Medication Sig Start Date End Date Taking? Authorizing Provider  Ascorbic Acid (VITAMIN C) 1000 MG tablet Take 1,000 mg by mouth daily.   Yes Historical Provider, MD  aspirin 81 MG tablet Take 81 mg by mouth daily.   Yes Historical Provider, MD  calcium carbonate (OS-CAL) 600 MG TABS tablet Take 600 mg by mouth daily with breakfast.   Yes Historical Provider, MD  dicyclomine (BENTYL) 10 MG capsule Take 1 capsule (10 mg total) by mouth 4 (four) times daily -  before meals and at bedtime. 11/17/16  Yes Carlis Stable, NP  folic acid (FOLVITE) 1 MG tablet Take 1 mg by mouth daily.   Yes Historical Provider, MD  ibuprofen (ADVIL,MOTRIN) 200 MG tablet Take 400 mg by mouth every 6 (six) hours as needed for headache.   Yes Historical Provider, MD  traZODone (DESYREL) 100 MG tablet Take 100 mg by mouth at bedtime as needed for sleep.   Yes Historical Provider, MD  Abatacept Maureen Chatters) 125 MG/ML SOSY Inject into the skin every Sunday.     Historical Provider, MD  methotrexate (RHEUMATREX) 2.5 MG tablet Take  25 mg by mouth every Sunday. Pt takes 10 tablets weekly. Caution:Chemotherapy. Protect from light.    Historical Provider, MD  predniSONE (DELTASONE) 5 MG tablet Take 5 mg by mouth 2 (two) times daily with a meal.     Historical Provider, MD    Family History Family History  Problem Relation Age of Onset  . Heart attack Father   . Diabetes Mother   . Colon cancer Neg Hx   . Liver disease Neg Hx     Social History Social History  Substance Use Topics  . Smoking status: Former  Smoker    Packs/day: 1.00    Years: 39.00    Types: Cigarettes    Start date: 07/11/1975    Quit date: 04/26/2014  . Smokeless tobacco: Never Used     Comment: Quit x 3 years  . Alcohol use No     Allergies   Patient has no known allergies.   Review of Systems Review of Systems  Constitutional:       Per HPI, otherwise negative  HENT:       Per HPI, otherwise negative  Respiratory:       Per HPI, otherwise negative  Cardiovascular:       Per HPI, otherwise negative  Gastrointestinal: Negative for abdominal pain and vomiting.  Endocrine:       Negative aside from HPI  Genitourinary:       Neg aside from HPI   Musculoskeletal:       Per HPI, otherwise negative  Skin: Negative.  Negative for color change.  Neurological: Negative for syncope.     Physical Exam Updated Vital Signs BP 107/73 (BP Location: Left Arm)   Pulse 60   Temp 97.7 F (36.5 C) (Oral)   Resp 14   Ht 5\' 11"  (1.803 m)   Wt 225 lb (102.1 kg)   SpO2 98%   BMI 31.38 kg/m   Physical Exam  Constitutional: He is oriented to person, place, and time. He appears well-developed. No distress.  HENT:  Head: Normocephalic.    Eyes: Conjunctivae and EOM are normal.  Neck: No spinous process tenderness and no muscular tenderness present. No neck rigidity. No edema, no erythema and normal range of motion present.  Cardiovascular: Normal rate and regular rhythm.   Pulmonary/Chest: Effort normal. No stridor. No respiratory distress.  Abdominal: He exhibits no distension. There is no tenderness.    Musculoskeletal: He exhibits no edema.  Neurological: He is alert and oriented to person, place, and time.  Skin: Skin is warm and dry. No pallor.  Psychiatric: He has a normal mood and affect.  Nursing note and vitals reviewed.    ED Treatments / Results  Labs (all labs ordered are listed, but only abnormal results are displayed) Labs Reviewed  COMPREHENSIVE METABOLIC PANEL - Abnormal; Notable for the  following:       Result Value   Calcium 8.3 (*)    Albumin 3.2 (*)    ALT 12 (*)    Anion gap 4 (*)    All other components within normal limits  CBC WITH DIFFERENTIAL/PLATELET - Abnormal; Notable for the following:    Monocytes Absolute 1.4 (*)    All other components within normal limits  LIPASE, BLOOD   Radiology Ct Head Wo Contrast  Result Date: 12/04/2016 CLINICAL DATA:  61 year old male with headache following injury 3 days ago. Initial encounter. EXAM: CT HEAD WITHOUT CONTRAST TECHNIQUE: Contiguous axial images were obtained from the base of  the skull through the vertex without intravenous contrast. COMPARISON:  03/22/2011 head CT FINDINGS: Brain: No evidence of acute infarction, hemorrhage, hydrocephalus, extra-axial collection or mass lesion/mass effect. Vascular: No hyperdense vessel or unexpected calcification. Skull: Normal. Negative for fracture or focal lesion. Sinuses/Orbits: No acute finding. Other: None. IMPRESSION: No evidence of intracranial abnormality or fracture. Electronically Signed   By: Margarette Canada M.D.   On: 12/04/2016 12:24    Procedures Procedures (including critical care time)  Medications Ordered in ED Medications  ketorolac (TORADOL) 30 MG/ML injection 9.9 mg (not administered)  fentaNYL (SUBLIMAZE) injection 50 mcg (not administered)     Initial Impression / Assessment and Plan / ED Course  I have reviewed the triage vital signs and the nursing notes.  Pertinent labs & imaging results that were available during my care of the patient were reviewed by me and considered in my medical decision making (see chart for details).  1:07 PM Patient in no distress, all labs, CT reviewed with patient and his wife. With reassuring CT, labs, no evidence for neurologic dysfunction, patient appropriate for further evaluation, management as an outpatient.   Final Clinical Impressions(s) / ED Diagnoses   Final diagnoses:  Injury of head, initial encounter     New Prescriptions New Prescriptions   OXYCODONE-ACETAMINOPHEN (PERCOCET/ROXICET) 5-325 MG TABLET    Take 2 tablets by mouth every 8 (eight) hours as needed for severe pain.     Carmin Muskrat, MD 12/04/16 1308

## 2016-12-04 NOTE — ED Triage Notes (Signed)
Patient reports "bumping" the left side of his head on a "sharp corner" at work on Thursday. Reports headache since that time. Denies new onset blurred vision and N/V/D. Ambulatory to treatment room.

## 2016-12-04 NOTE — ED Notes (Signed)
ED Provider at bedside. 

## 2016-12-04 NOTE — ED Notes (Signed)
Unable to collect labs nurse is going in to start IV

## 2016-12-04 NOTE — ED Notes (Signed)
Patient transported to CT 

## 2016-12-04 NOTE — Discharge Instructions (Signed)
As discussed, your evaluation today has been largely reassuring.  But, it is important that you monitor your condition carefully, and do not hesitate to return to the ED if you develop new, or concerning changes in your condition. ? ?Otherwise, please follow-up with your physician for appropriate ongoing care. ? ?

## 2016-12-21 ENCOUNTER — Telehealth: Payer: Self-pay | Admitting: Nurse Practitioner

## 2016-12-21 NOTE — Telephone Encounter (Signed)
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 All follow-up serology negative and likely previous test false negative. Noted no contraindication for restarting RA medications; no need for prophylactic Hep B treatment.  Note and letter marked for scanning.

## 2017-01-17 ENCOUNTER — Ambulatory Visit: Payer: BLUE CROSS/BLUE SHIELD | Admitting: Nurse Practitioner

## 2017-04-14 ENCOUNTER — Ambulatory Visit: Payer: BLUE CROSS/BLUE SHIELD | Attending: Rheumatology

## 2017-04-14 DIAGNOSIS — M25542 Pain in joints of left hand: Secondary | ICD-10-CM | POA: Diagnosis present

## 2017-04-14 DIAGNOSIS — G8929 Other chronic pain: Secondary | ICD-10-CM | POA: Diagnosis present

## 2017-04-14 DIAGNOSIS — M25572 Pain in left ankle and joints of left foot: Secondary | ICD-10-CM | POA: Diagnosis present

## 2017-04-14 DIAGNOSIS — M069 Rheumatoid arthritis, unspecified: Secondary | ICD-10-CM | POA: Diagnosis not present

## 2017-04-14 DIAGNOSIS — M25541 Pain in joints of right hand: Secondary | ICD-10-CM | POA: Insufficient documentation

## 2017-04-14 DIAGNOSIS — M545 Low back pain: Secondary | ICD-10-CM | POA: Diagnosis present

## 2017-04-14 DIAGNOSIS — M25561 Pain in right knee: Secondary | ICD-10-CM | POA: Insufficient documentation

## 2017-04-14 DIAGNOSIS — M25571 Pain in right ankle and joints of right foot: Secondary | ICD-10-CM | POA: Diagnosis present

## 2017-04-14 DIAGNOSIS — M25562 Pain in left knee: Secondary | ICD-10-CM | POA: Diagnosis present

## 2017-04-14 DIAGNOSIS — R609 Edema, unspecified: Secondary | ICD-10-CM | POA: Diagnosis present

## 2017-04-14 NOTE — Therapy (Signed)
[place], [place], [address] Phone: [phone]   Fax:  [phone]  Physical Therapy Evaluation/FCE  Patient Details  Name: Ronald Cameron MRN: [id number] Date of Birth: Mar 14, 1956 Referring Provider: Sabino Niemann, MD  Encounter Date: 04/14/2017      PT End of Session - 04/14/17 1132    Visit Number 1   Number of Visits 1   PT Start Time 0700   PT Stop Time 1100   PT Time Calculation (min) 240 min   Activity Tolerance Patient tolerated treatment well;Patient limited by pain   Behavior During Therapy [facility] for tasks assessed/performed      Past Medical History:  Diagnosis Date  . Collagen vascular disease (HCC)    Rheumatoid Arthritis  . Rheumatoid arthritis [facility])     Past Surgical History:  Procedure Laterality Date  . COLONOSCOPY N/A 04/13/2016   Dr. Gala Cameron: 14 mm polyp in the ascending colon removed, diverticulosis in the distal descending colon and sigmoid colon, nonbleeding internal hemorrhoids. Pathology-tubular adenoma. Next colonoscopy 5 years.  . ELBOW SURGERY     X 3 last year. Staph infection but not MRSA per patient. infectious disease. out of work 6 months.   . TONSILLECTOMY      There were no vitals filed for this visit.       Subjective Assessment - 04/14/17 1126    Subjective See FCE scanned into EPIC            [facility] PT Assessment - 04/14/17 0001      Assessment   Medical Diagnosis RA   Referring Provider Ronald Niemann, MD   Onset Date/Surgical Date --  1993   Hand Dominance Right   Next MD Visit 04/2017   Prior Therapy No     Precautions   Precautions None     Restrictions   Weight Bearing Restrictions No     Cognition   Overall Cognitive Status Within Functional Limits for tasks assessed            Objective measurements completed on examination: See above findings.   See FCE report scanned into EPIC.   Report faxed to Dr  Ronald Cameron               PT Education - 04/14/17 1131    Education provided Yes   Education Details Reveiwed results of FCE with Ronald Cameron) Educated Patient   Methods Explanation   Comprehension Verbalized understanding                     Plan - 04/14/17 1132    Clinical Impression Statement Ronald Cameron completed the FCE testing with rating of sedentary work for 8 hour day/ 40 hour week. He appeared to give a sincere effort.  See report scanned into EPIC   PT Next Visit Plan Ronald Cameron to return to MD for review of FCE results   Consulted and Agree with Plan of Care Patient      Patient will benefit from skilled therapeutic intervention in order to improve the following deficits and impairments:     Visit Diagnosis: Rheumatoid arthritis involving multiple sites, unspecified rheumatoid factor presence ([place]) - Plan: PT plan of care cert/re-cert  Chronic pain of right knee - Plan: PT plan of care cert/re-cert  Chronic pain of left knee - Plan: PT plan of care cert/re-cert  Pain in joints of left hand - Plan: PT plan of care cert/re-cert  Pain in joints of right hand - Plan: PT plan of care cert/re-cert  Pain in left ankle and joints of left foot - Plan: PT plan of care cert/re-cert  Pain in right ankle and joints of right foot - Plan: PT plan of care cert/re-cert  Chronic bilateral low back pain without sciatica - Plan: PT plan of care cert/re-cert  Edema, unspecified type - Plan: PT plan of care cert/re-cert     Problem List Patient Active Problem List   Diagnosis Date Noted  . Diarrhea 11/17/2016  . Abnormal hepatitis serology 11/17/2016  . Renal lesion 05/06/2016  . History of colonic polyps   . Diverticulosis of colon without hemorrhage   . Bloating 04/08/2016  . Encounter for screening colonoscopy 04/08/2016  . Diverticulitis of colon without hemorrhage 04/06/2015  . Fatty liver 04/06/2015  . Early satiety 02/20/2015  .  Abdominal pain, epigastric 02/20/2015  . Abdominal swelling, generalized 02/20/2015  . High risk medication use 02/20/2015  . Septic olecranon bursitis of right elbow 12/16/2013  . Pain in elbow 12/16/2013  . EXTERNAL HEMORRHOIDS 12/18/2008  . GERD 12/18/2008  . NEPHROLITHIASIS, HX OF 12/18/2008  . Rheumatoid arthritis ([place]) 12/17/2008  . Hemorrhoids 12/15/2008  . RECTAL BLEEDING 12/15/2008  . RECTAL PAIN 12/15/2008  . Lower abdominal pain 12/15/2008    Ronald Cameron PT 04/14/2017, 11:44 AM  [facility] [address] [place], [place], [address] Phone: [phone]   Fax:  [phone]  Name: Ronald Cameron MRN: [id number] Date of Birth: 02-07-1956

## 2017-08-31 ENCOUNTER — Encounter: Payer: Self-pay | Admitting: Gastroenterology

## 2017-08-31 ENCOUNTER — Emergency Department (HOSPITAL_COMMUNITY)
Admission: EM | Admit: 2017-08-31 | Discharge: 2017-08-31 | Disposition: A | Discharge status: Home / Self Care | Payer: BLUE CROSS/BLUE SHIELD | Attending: Emergency Medicine | Admitting: Emergency Medicine

## 2017-08-31 ENCOUNTER — Ambulatory Visit (INDEPENDENT_AMBULATORY_CARE_PROVIDER_SITE_OTHER): Payer: BLUE CROSS/BLUE SHIELD | Admitting: Gastroenterology

## 2017-08-31 ENCOUNTER — Emergency Department (HOSPITAL_COMMUNITY): Payer: BLUE CROSS/BLUE SHIELD

## 2017-08-31 ENCOUNTER — Telehealth: Payer: Self-pay | Admitting: Gastroenterology

## 2017-08-31 ENCOUNTER — Encounter (HOSPITAL_COMMUNITY): Payer: Self-pay | Admitting: Emergency Medicine

## 2017-08-31 DIAGNOSIS — R1032 Left lower quadrant pain: Secondary | ICD-10-CM

## 2017-08-31 DIAGNOSIS — R1010 Upper abdominal pain, unspecified: Secondary | ICD-10-CM | POA: Diagnosis not present

## 2017-08-31 DIAGNOSIS — Z87891 Personal history of nicotine dependence: Secondary | ICD-10-CM | POA: Diagnosis not present

## 2017-08-31 DIAGNOSIS — Z79899 Other long term (current) drug therapy: Secondary | ICD-10-CM | POA: Insufficient documentation

## 2017-08-31 DIAGNOSIS — R1031 Right lower quadrant pain: Secondary | ICD-10-CM

## 2017-08-31 DIAGNOSIS — Z7982 Long term (current) use of aspirin: Secondary | ICD-10-CM | POA: Diagnosis not present

## 2017-08-31 LAB — URINALYSIS, ROUTINE W REFLEX MICROSCOPIC
BILIRUBIN URINE: NEGATIVE
Glucose, UA: NEGATIVE mg/dL
HGB URINE DIPSTICK: NEGATIVE
KETONES UR: NEGATIVE mg/dL
Leukocytes, UA: NEGATIVE
Nitrite: NEGATIVE
PH: 6 (ref 5.0–8.0)
Protein, ur: NEGATIVE mg/dL
Specific Gravity, Urine: 1.023 (ref 1.005–1.030)

## 2017-08-31 LAB — COMPREHENSIVE METABOLIC PANEL
ALBUMIN: 4.2 g/dL (ref 3.5–5.0)
ALK PHOS: 44 U/L (ref 38–126)
ALT: 56 U/L (ref 17–63)
ANION GAP: 7 (ref 5–15)
AST: 40 U/L (ref 15–41)
BILIRUBIN TOTAL: 0.9 mg/dL (ref 0.3–1.2)
BUN: 22 mg/dL — AB (ref 6–20)
CALCIUM: 9.3 mg/dL (ref 8.9–10.3)
CO2: 25 mmol/L (ref 22–32)
Chloride: 106 mmol/L (ref 101–111)
Creatinine, Ser: 1.05 mg/dL (ref 0.61–1.24)
GFR calc Af Amer: >60 mL/min (ref >60)
GFR calc non Af Amer: >60 mL/min (ref >60)
GLUCOSE: 97 mg/dL (ref 65–99)
Potassium: 3.6 mmol/L (ref 3.5–5.1)
SODIUM: 138 mmol/L (ref 135–145)
Total Protein: 7.5 g/dL (ref 6.5–8.1)

## 2017-08-31 LAB — CBC
HCT: 49.6 % (ref 39.0–52.0)
HEMOGLOBIN: 16.3 g/dL (ref 13.0–17.0)
MCH: 33.2 pg (ref 26.0–34.0)
MCHC: 32.9 g/dL (ref 30.0–36.0)
MCV: 101 fL — ABNORMAL HIGH (ref 78.0–100.0)
Platelets: 328 10*3/uL (ref 150–400)
RBC: 4.91 MIL/uL (ref 4.22–5.81)
RDW: 15.6 % — AB (ref 11.5–15.5)
WBC: 5.9 10*3/uL (ref 4.0–10.5)

## 2017-08-31 LAB — LIPASE, BLOOD: Lipase: 36 U/L (ref 11–51)

## 2017-08-31 MED ORDER — ONDANSETRON HCL 4 MG/2ML IJ SOLN
4.0000 mg | Freq: Once | INTRAMUSCULAR | Status: AC
  Administered 2017-08-31: 4 mg via INTRAVENOUS
  Filled 2017-08-31: qty 2

## 2017-08-31 MED ORDER — TRAMADOL HCL 50 MG PO TABS: 50.0000 mg | ORAL_TABLET | Freq: Four times a day (QID) | ORAL | 0 refills | Status: AC | PRN

## 2017-08-31 MED ORDER — IOPAMIDOL (ISOVUE-300) INJECTION 61%
100.0000 mL | Freq: Once | INTRAVENOUS | Status: AC | PRN
  Administered 2017-08-31: 100 mL via INTRAVENOUS

## 2017-08-31 MED ORDER — HYDROMORPHONE HCL 1 MG/ML IJ SOLN
1.0000 mg | Freq: Once | INTRAMUSCULAR | Status: AC
  Administered 2017-08-31: 1 mg via INTRAVENOUS
  Filled 2017-08-31: qty 1

## 2017-08-31 MED ORDER — SODIUM CHLORIDE 0.9 % IV BOLUS (SEPSIS)
1000.0000 mL | Freq: Once | INTRAVENOUS | Status: AC
  Administered 2017-08-31: 1000 mL via INTRAVENOUS

## 2017-08-31 NOTE — Patient Instructions (Signed)
I am recommending that you go to the ED. I have called them, and they know you are coming.  I hope you feel better very soon!  It was a pleasure to see you today. I strive to create trusting relationships with patients to provide genuine, compassionate, and quality care. I value your feedback. If you receive a survey regarding your visit,  I greatly appreciate you the taking time to fill this out.   Annitta Needs, PhD, ANP-BC [facility] Gastro[facility]

## 2017-08-31 NOTE — Progress Notes (Signed)
CC'ED TO PCP 

## 2017-08-31 NOTE — Discharge Instructions (Signed)
Your gastroenterologist will contact you for follow-up in the next couple weeks

## 2017-08-31 NOTE — Telephone Encounter (Signed)
Pt notified, pt needs a f/u appointment in a few weeks, can use an urgent slot. Pt knows he should f/u with his pcp. Pt will call for an update.  Routing message to schedule apt.

## 2017-08-31 NOTE — ED Triage Notes (Signed)
Pt reports pain in lower abdomen with no n/v/d since Monday progressively getting worse.  Pain better with sitting.  Saw GI this morning.

## 2017-08-31 NOTE — Assessment & Plan Note (Signed)
61 year old male presenting with acute onset of pain entire lower abdomen, starting on Monday. No fever but notes "hot flashes", no N/V, changes in bowel habits. Notes urine is "bubbly", otherwise no other associated factors. In obvious discomfort at time of appointment but able to converse with me. While walking to exam table, he is bent slightly over and holding lower abdomen. On physical exam, he has exquisite tenderness to palpation entire lower abdomen, with rebound and guarding. No upper abdominal TTP. He is hemodynamically stable, but his symptoms and physical exam warrant immediate attention. I discussed with him that I could do a stat CT abd/pelvis today, but it was unclear how long this would take and with his presentation, I am concerned and feel he needs evaluation in ED with labs, abdominal imaging, and more further evaluation than I can do here in the clinic. He agrees. I spoke with Tiffany in the ED to inform he would be coming.

## 2017-08-31 NOTE — ED Notes (Signed)
Pt transported to CT ?

## 2017-08-31 NOTE — Progress Notes (Signed)
Referring Provider: Claretta Fraise, MD Primary Care Physician:  Claretta Fraise, MD  Primary GI: Dr. [name]    Chief Complaint  Patient presents with  . Abdominal Pain    since Monday; lower abd    HPI:   Ronald Cameron is a 61 y.o. male presenting today with a prior history of abdominal pain, evaluated previously with ultrasound, CT, and colonoscopy. History of indeterminate Hep B core IgM in past, negative Hep C antibody, Hep A IgM negative, Hep B surface antigen negative, with follow-up serology negative, no need for Hep B treatment (was seen [facility]). It was felt that the Hep B IgM was a false positive results previously. Presents today with acute abdominal pain X 3 days.   Started having lower abdominal pain on Monday. Had eaten pizza Sunday. Was trying to give it a few days to see if it would clear up, but it has worsened. No fever or chills, just "hot flashes". Several BMs a day, but this is soft, which is his baseline. Pain is constant. If pushing on it, it is worse. Dog bumped him with his head and caused worsening pain. Couldn't sleep last night as he couldn't get comfortable. No N/V. Eating normal right now. States it doesn't feel like diverticulitis this time. Feels like a constant pain/pressure, feels hard. No urinary symptoms. Urine is "bubbly". Urine was foamy looking yesterday and today. Had large amount of gas yesterday evening. States he does not like to seek medical care unless he is having significant issues. Notes pain 9/10.   Past Medical History:  Diagnosis Date  . Collagen vascular disease (HCC)    Rheumatoid Arthritis  . Rheumatoid arthritis [facility])     Past Surgical History:  Procedure Laterality Date  . COLONOSCOPY N/A 04/13/2016   Dr. [name]: 14 mm polyp in the ascending colon removed, diverticulosis in the distal descending colon and sigmoid colon, nonbleeding internal hemorrhoids. Pathology-tubular adenoma. Next colonoscopy 5 years.    . ELBOW SURGERY     X 3 last year. Staph infection but not MRSA per patient. infectious disease. out of work 6 months.   . TONSILLECTOMY      Current Outpatient Medications  Medication Sig Dispense Refill  . Ascorbic Acid (VITAMIN C) 1000 MG tablet Take 1,000 mg by mouth daily.    Marland Kitchen aspirin 81 MG tablet Take 81 mg by mouth daily.    . calcium carbonate (OS-CAL) 600 MG TABS tablet Take 600 mg by mouth daily with breakfast.    . folic acid (FOLVITE) 1 MG tablet Take 1 mg by mouth daily.    . methotrexate (RHEUMATREX) 2.5 MG tablet Take 25 mg by mouth every Sunday. Pt takes 10 tablets weekly. Caution:Chemotherapy. Protect from light.    . predniSONE (DELTASONE) 5 MG tablet Take 7.5 mg by mouth daily.     . Tocilizumab 162 MG/0.9ML SOSY Inject into the skin every 30 (thirty) days. Pt thinks dose may be 400mg     . traMADol (ULTRAM) 50 MG tablet Take 50 mg by mouth as needed.    . traZODone (DESYREL) 100 MG tablet Take 100 mg by mouth at bedtime as needed for sleep.     No current facility-administered medications for this visit.     Allergies as of 08/31/2017  . (No Known Allergies)    Family History  Problem Relation Age of Onset  . Heart attack Father   . Diabetes Mother   . Colon cancer Neg  Hx   . Liver disease Neg Hx     Social History   Socioeconomic History  . Marital status: Married    Spouse name: None  . Number of children: 0  . Years of education: None  . Highest education level: None  Social Needs  . Financial resource strain: None  . Food insecurity - worry: None  . Food insecurity - inability: None  . Transportation needs - medical: None  . Transportation needs - non-medical: None  Occupational History  . Occupation: Horticulturist, commercial  Tobacco Use  . Smoking status: Former Smoker    Packs/day: 1.00    Years: 39.00    Pack years: 39.00    Types: Cigarettes    Start date: 07/11/1975    Last attempt to quit: 04/26/2014    Years since quitting: 3.3  . Smokeless  tobacco: Never Used  . Tobacco comment: Quit x 3 years  Substance and Sexual Activity  . Alcohol use: No    Alcohol/week: 0.0 oz  . Drug use: No  . Sexual activity: None  Other Topics Concern  . None  Social History Narrative  . None    Review of Systems: Gen: see HPI  CV: Denies chest pain, palpitations, syncope, peripheral edema, and claudication. Resp: Denies dyspnea at rest, cough, wheezing, coughing up blood, and pleurisy. GI: see HPI  Derm: Denies rash, itching, dry skin Psych: Denies depression, anxiety, memory loss, confusion. No homicidal or suicidal ideation.  Heme: Denies bruising, bleeding, and enlarged lymph nodes.  Physical Exam: BP 136/78   Pulse 80   Temp (!) 97 F (36.1 C) (Oral)   Ht 5\' 11"  (1.803 m)   Wt 225 lb 6.4 oz (102.2 kg)   BMI 31.44 kg/m  General:   Alert and oriented. Obviously uncomfortable in chair, holding lower abdomen Head:  Normocephalic and atraumatic. Eyes:  Conjuctiva clear without scleral icterus. Mouth:  Oral mucosa pink and moist.  Abdomen:  +BS, distended but soft, TTP RLQ/suprapubic/LLQ, with REBOUND AND GUARDING.  Msk:  Symmetrical without gross deformities. Normal posture. Extremities:  Without edema. Neurologic:  Alert and  oriented x4 Psych:  Alert and cooperative.

## 2017-08-31 NOTE — ED Provider Notes (Signed)
[facility] EMERGENCY DEPARTMENT Provider Note   CSN: [id number] Arrival date & time: 08/31/17  1029     History   Chief Complaint Chief Complaint  Patient presents with  . Abdominal Pain    HPI Ronald Cameron is a 61 y.o. male.  Patient states that he has been having some abdominal pain but worse on the right lower side for about 2-3 days.  He was seen over at the gastroenterologist office and they sent him over here for probable CT scan of the abdomen    Abdominal Pain   This is a new problem. The current episode started more than 2 days ago. The problem occurs constantly. The problem has not changed since onset.The pain is associated with an unknown factor. The pain is located in the RLQ. The quality of the pain is aching. The pain is at a severity of 6/10. The pain is moderate. Pertinent negatives include anorexia, diarrhea, frequency, hematuria and headaches. Nothing aggravates the symptoms. Nothing relieves the symptoms.    Past Medical History:  Diagnosis Date  . Collagen vascular disease (HCC)    Rheumatoid Arthritis  . Rheumatoid arthritis [facility])     Patient Active Problem List   Diagnosis Date Noted  . Acute bilateral lower abdominal pain 08/31/2017  . Diarrhea 11/17/2016  . Abnormal hepatitis serology 11/17/2016  . Renal lesion 05/06/2016  . History of colonic polyps   . Diverticulosis of colon without hemorrhage   . Bloating 04/08/2016  . Encounter for screening colonoscopy 04/08/2016  . Diverticulitis of colon without hemorrhage 04/06/2015  . Fatty liver 04/06/2015  . Early satiety 02/20/2015  . Abdominal pain, epigastric 02/20/2015  . Abdominal swelling, generalized 02/20/2015  . High risk medication use 02/20/2015  . Septic olecranon bursitis of right elbow 12/16/2013  . Pain in elbow 12/16/2013  . EXTERNAL HEMORRHOIDS 12/18/2008  . GERD 12/18/2008  . NEPHROLITHIASIS, HX OF 12/18/2008  . Rheumatoid arthritis ([place]) 12/17/2008  . Hemorrhoids  12/15/2008  . RECTAL BLEEDING 12/15/2008  . RECTAL PAIN 12/15/2008  . Lower abdominal pain 12/15/2008    Past Surgical History:  Procedure Laterality Date  . COLONOSCOPY N/A 04/13/2016   Dr. [name]: 14 mm polyp in the ascending colon removed, diverticulosis in the distal descending colon and sigmoid colon, nonbleeding internal hemorrhoids. Pathology-tubular adenoma. Next colonoscopy 5 years.  . ELBOW SURGERY     X 3 last year. Staph infection but not MRSA per patient. infectious disease. out of work 6 months.   . TONSILLECTOMY         Home Medications    Prior to Admission medications   Medication Sig Start Date End Date Taking? Authorizing Provider  Ascorbic Acid (VITAMIN C) 1000 MG tablet Take 1,000 mg by mouth daily.   Yes [provider]  aspirin 81 MG tablet Take 81 mg by mouth daily.   Yes [provider]  calcium carbonate (OS-CAL) 600 MG TABS tablet Take 600 mg by mouth daily with breakfast.   Yes [provider]  folic acid (FOLVITE) 1 MG tablet Take 1 mg by mouth daily.   Yes [provider]  methotrexate (RHEUMATREX) 2.5 MG tablet Take 25 mg by mouth every Sunday. Pt takes 10 tablets weekly. Caution:Chemotherapy. Protect from light.   Yes [provider]  predniSONE (DELTASONE) 5 MG tablet Take 7.5 mg by mouth daily.    Yes [provider]  Tocilizumab 162 MG/0.9ML SOSY Inject into the skin every 30 (thirty) days. Pt thinks dose may  be 400mg    Yes [provider]  traZODone (DESYREL) 100 MG tablet Take 100 mg by mouth at bedtime as needed for sleep.   Yes [provider]  traMADol (ULTRAM) 50 MG tablet Take 1 tablet (50 mg total) by mouth every 6 (six) hours as needed. 08/31/17   Milton Ferguson, MD    Family History Family History  Problem Relation Age of Onset  . Heart attack Father   . Diabetes Mother   . Colon cancer Neg Hx   . Liver disease Neg Hx     Social History Social History    Tobacco Use  . Smoking status: Former Smoker    Packs/day: 1.00    Years: 39.00    Pack years: 39.00    Types: Cigarettes    Start date: 07/11/1975    Last attempt to quit: 04/26/2014    Years since quitting: 3.3  . Smokeless tobacco: Never Used  . Tobacco comment: Quit x 3 years  Substance Use Topics  . Alcohol use: No    Alcohol/week: 0.0 oz  . Drug use: No     Allergies   Patient has no known allergies.   Review of Systems Review of Systems  Constitutional: Negative for appetite change and fatigue.  HENT: Negative for congestion, ear discharge and sinus pressure.   Eyes: Negative for discharge.  Respiratory: Negative for cough.   Cardiovascular: Negative for chest pain.  Gastrointestinal: Positive for abdominal pain. Negative for anorexia and diarrhea.  Genitourinary: Negative for frequency and hematuria.  Musculoskeletal: Negative for back pain.  Skin: Negative for rash.  Neurological: Negative for seizures and headaches.  Psychiatric/Behavioral: Negative for hallucinations.     Physical Exam Updated Vital Signs BP 132/84   Pulse (!) 59   Temp 97.8 F (36.6 C) (Oral)   Resp 18   Ht 5\' 11"  (1.803 m)   Wt 102.1 kg (225 lb)   SpO2 94%   BMI 31.38 kg/m   Physical Exam  Constitutional: He is oriented to person, place, and time. He appears well-developed.  HENT:  Head: Normocephalic.  Eyes: Conjunctivae and EOM are normal. No scleral icterus.  Neck: Neck supple. No thyromegaly present.  Cardiovascular: Normal rate and regular rhythm. Exam reveals no gallop and no friction rub.  No murmur heard. Pulmonary/Chest: No stridor. He has no wheezes. He has no rales. He exhibits no tenderness.  Abdominal: He exhibits no distension. There is tenderness. There is no rebound.  Moderate right lower quadrant and suprapubic tenderness  Musculoskeletal: Normal range of motion. He exhibits no edema.  Lymphadenopathy:    He has no cervical adenopathy.  Neurological: He  is oriented to person, place, and time. He exhibits normal muscle tone. Coordination normal.  Skin: No rash noted. No erythema.  Psychiatric: He has a normal mood and affect. His behavior is normal.     ED Treatments / Results  Labs (all labs ordered are listed, but only abnormal results are displayed) Labs Reviewed  COMPREHENSIVE METABOLIC PANEL - Abnormal; Notable for the following components:      Result Value   BUN 22 (*)    All other components within normal limits  CBC - Abnormal; Notable for the following components:   MCV 101.0 (*)    RDW 15.6 (*)    All other components within normal limits  LIPASE, BLOOD  URINALYSIS, ROUTINE W REFLEX MICROSCOPIC    EKG  EKG Interpretation None       Radiology Ct Abdomen Pelvis  W Contrast  Result Date: 08/31/2017 CLINICAL DATA:  Acute lower abdominal pain. EXAM: CT ABDOMEN AND PELVIS WITH CONTRAST TECHNIQUE: Multidetector CT imaging of the abdomen and pelvis was performed using the standard protocol following bolus administration of intravenous contrast. CONTRAST:  134mL ISOVUE-300 IOPAMIDOL (ISOVUE-300) INJECTION 61% COMPARISON:  CT scan of March 02, 2015. FINDINGS: Lower chest: No acute abnormality. Hepatobiliary: No gallstones are noted. Focal fatty infiltration of the liver is noted. Pancreas: Unremarkable. No pancreatic ductal dilatation or surrounding inflammatory changes. Spleen: Normal in size without focal abnormality. Adrenals/Urinary Tract: Adrenal glands are unremarkable. Small nonobstructive left renal calculi are noted. No hydronephrosis or renal obstruction is noted. Bladder is unremarkable. Stomach/Bowel: Stomach is within normal limits. Appendix appears normal. No evidence of bowel wall thickening, distention, or inflammatory changes. Sigmoid diverticulosis is noted without inflammation. Vascular/Lymphatic: Aortic atherosclerosis. No enlarged abdominal or pelvic lymph nodes. Reproductive: Prostate is unremarkable. Other: No  abdominal wall hernia or abnormality. No abdominopelvic ascites. Musculoskeletal: No acute or significant osseous findings. IMPRESSION: Fatty infiltration of the liver. Sigmoid diverticulosis without inflammation. Aortic atherosclerosis. Nonobstructive left nephrolithiasis. No other abnormality seen in the abdomen or pelvis. Electronically Signed   By: Marijo Conception, M.D.   On: 08/31/2017 14:41    Procedures Procedures (including critical care time)  Medications Ordered in ED Medications  sodium chloride 0.9 % bolus 1,000 mL (0 mLs Intravenous Stopped 08/31/17 1322)  HYDROmorphone (DILAUDID) injection 1 mg (1 mg Intravenous Given 08/31/17 1216)  ondansetron (ZOFRAN) injection 4 mg (4 mg Intravenous Given 08/31/17 1215)  iopamidol (ISOVUE-300) 61 % injection 100 mL (100 mLs Intravenous Contrast Given 08/31/17 1340)     Initial Impression / Assessment and Plan / ED Course  I have reviewed the triage vital signs and the nursing notes.  Pertinent labs & imaging results that were available during my care of the patient were reviewed by me and considered in my medical decision making (see chart for details).     Patient has normal labs and normal CT of the abdomen.  He has minimal pain now.  I spoke with Dr. Sydell Axon and his office will follow back up with the patient.  I am writing him for a few Ultram if he needs something for discomfort  Final Clinical Impressions(s) / ED Diagnoses   Final diagnoses:  Pain of upper abdomen    ED Discharge Orders        Ordered    traMADol (ULTRAM) 50 MG tablet  Every 6 hours PRN     08/31/17 1521       Milton Ferguson, MD 08/31/17 1528

## 2017-08-31 NOTE — Telephone Encounter (Signed)
CT in the ED was unrevealing.  He will be asked to follow a low-residue diet for a few days. He is at baseline with his bowel habits. Please have him follow-up with PCP as well so we can exclude anything else going on. Etiology of abdominal pain is unclear at this moment. Let's offer office visit in next few weeks, can use an urgent. Have him call tomorrow morning with an update, after he has backed off on his diet for a bit.

## 2017-09-01 ENCOUNTER — Ambulatory Visit (INDEPENDENT_AMBULATORY_CARE_PROVIDER_SITE_OTHER): Payer: BLUE CROSS/BLUE SHIELD | Admitting: Family Medicine

## 2017-09-01 ENCOUNTER — Other Ambulatory Visit: Payer: Self-pay | Admitting: Gastroenterology

## 2017-09-01 ENCOUNTER — Encounter: Payer: Self-pay | Admitting: Family Medicine

## 2017-09-01 ENCOUNTER — Telehealth: Payer: Self-pay

## 2017-09-01 ENCOUNTER — Telehealth: Payer: Self-pay | Admitting: Internal Medicine

## 2017-09-01 VITALS — BP 139/87 | HR 73 | Temp 96.9°F | Ht 71.0 in | Wt 229.0 lb

## 2017-09-01 DIAGNOSIS — I700 Atherosclerosis of aorta: Secondary | ICD-10-CM | POA: Diagnosis not present

## 2017-09-01 DIAGNOSIS — R1031 Right lower quadrant pain: Secondary | ICD-10-CM

## 2017-09-01 DIAGNOSIS — R140 Abdominal distension (gaseous): Secondary | ICD-10-CM

## 2017-09-01 DIAGNOSIS — K5900 Constipation, unspecified: Secondary | ICD-10-CM | POA: Diagnosis not present

## 2017-09-01 MED ORDER — DICYCLOMINE HCL 10 MG PO CAPS: 10.0000 mg | ORAL_CAPSULE | Freq: Three times a day (TID) | ORAL | 1 refills | Status: DC

## 2017-09-01 MED ORDER — POLYETHYLENE GLYCOL 3350 17 GM/SCOOP PO POWD: ORAL | 0 refills | Status: DC

## 2017-09-01 NOTE — Telephone Encounter (Signed)
PATIENT COMING 09/16/17

## 2017-09-01 NOTE — Telephone Encounter (Signed)
Pt picked up diet sheet and when I called him to remind him of OV on Tuesday he had questions on the diet sheet. Please call him back at [phone]

## 2017-09-01 NOTE — Patient Instructions (Signed)
We discussed your CAT scan, which did not reveal any acute processes in the abdomen to explain your abdominal pain.  Your gastroenterologist is already sent over the Bentyl (dicyclomine) for abdominal cramping and pain.  I have prescribed you MiraLAX powder to use for constipation.  Consider obtaining an over-the-counter Gas-X medication to help relieve gas.  You may increase her tramadol to 100 mg twice daily for the next 2-3 days in attempt to better control your pain.  Make sure that you are taking a stool softener with this to prevent worsening constipation.  Drink plenty of water.  Thank you for coming in to clinic today.  Start with Miralax sent to pharmacy. First dose 68g (4 capfuls) in 32oz water over 1 to 2 hours for clean out. Next day start 17g or 1 capful daily, may adjust dose up or down by half a capful every few days. Recommend to take this medicine daily for next 1-2 weeks, then may need to use it longer if needed. - Goal is to have soft regular bowel movement 1-3x daily, if too runny or diarrhea, then reduce dose of the medicine  Improve water intake, hydration will help Also recommend increased vegetables, fruits, fiber intake Can try daily Metamucil or Fiber supplement at pharmacy over the counter  Follow-up w/ Dr Livia Snellen your gastroenterologist if symptoms are not improving with bowel movements, or if pain worsens, develop fevers, nausea, vomiting.  If you have any other questions or concerns, please feel free to call the clinic to contact me. You may also schedule an earlier appointment if necessary.  However, if your symptoms get significantly worse, please go to the Emergency Department to seek immediate medical attention.   Constipation, Adult Constipation is when a person:  Poops (has a bowel movement) fewer times in a week than normal.  Has a hard time pooping.  Has poop that is dry, hard, or bigger than normal.  Follow these instructions at home: Eating and  drinking   Eat foods that have a lot of fiber, such as: ? Fresh fruits and vegetables. ? Whole grains. ? Beans.  Eat less of foods that are high in fat, low in fiber, or overly processed, such as: ? [place] fries. ? Hamburgers. ? Cookies. ? Candy. ? Soda.  Drink enough fluid to keep your pee (urine) clear or pale yellow. General instructions  Exercise regularly or as told by your doctor.  Go to the restroom when you feel like you need to poop. Do not hold it in.  Take over-the-counter and prescription medicines only as told by your doctor. These include any fiber supplements.  Do pelvic floor retraining exercises, such as: ? Doing deep breathing while relaxing your lower belly (abdomen). ? Relaxing your pelvic floor while pooping.  Watch your condition for any changes.  Keep all follow-up visits as told by your doctor. This is important. Contact a doctor if:  You have pain that gets worse.  You have a fever.  You have not pooped for 4 days.  You throw up (vomit).  You are not hungry.  You lose weight.  You are bleeding from the anus.  You have thin, pencil-like poop (stool). Get help right away if:  You have a fever, and your symptoms suddenly get worse.  You leak poop or have blood in your poop.  Your belly feels hard or bigger than normal (is bloated).  You have very bad belly pain.  You feel dizzy or you faint. This information  is not intended to replace advice given to you by your health care provider. Make sure you discuss any questions you have with your health care provider. Document Released: 02/29/2008 Document Revised: 04/01/2016 Document Reviewed: 03/02/2016 Elsevier Interactive Patient Education  2017 [employer].

## 2017-09-01 NOTE — Progress Notes (Addendum)
Subjective: OZ:HYQMVHQIO pain PCP: Claretta Fraise, MD NGE:[id number] A Decicco is a 61 y.o. male presenting to clinic today for:  1. Abdominal pain Patient was seen by [facility] gastroenterology yesterday.  He was noted to have right lower quadrant abdominal pain with rebound tenderness.  He was sent to the emergency department for CT abdomen pelvis which did not demonstrate any focal inflammation or infection.  Fatty liver disease and Aortic atherosclerosis was appreciated.  Diverticulosis of the sigmoid colon was appreciated without evidence of inflammation or infection.  Left nonobstructing renal stone also seen.  Additionally, he had labs obtained which were unremarkable except for a mild elevation in the BUN.  CBC without evidence of anemia or infection.  Urinalysis was unremarkable as well.  His pain had essentially resolved in the emergency department and he was discharged home w. Tramadol prn pain.  Today, he reports that abdominal pain had subsided somewhat last evening but he woke up with it again this morning.  He notes that he is belching and passing flatus often.  He has not had a bowel movement since Wednesday.  He denies nausea, vomiting.  He does feel that his abdomen is bloated.  He reports generalized abdominal pain with predominance in the right lower quadrant.  He denies fevers, chills.  No dysuria, hematuria.  He has been taking 50 mg of tramadol up to twice daily with no improvement in symptoms.  He is planning on using a stool softener to promote a bowel movement this afternoon.  He notes that he called his gastroenterologist for advice this morning and a medication was sent for symptoms.  He has not yet picked this up.  He was told to also eat a low fiber diet to reduce bloating.  ROS: Per HPI  No Known Allergies Past Medical History:  Diagnosis Date  . Collagen vascular disease (HCC)    Rheumatoid Arthritis  . Rheumatoid arthritis ([place])     Current Outpatient  Medications:  .  Ascorbic Acid (VITAMIN C) 1000 MG tablet, Take 1,000 mg by mouth daily., Disp: , Rfl:  .  aspirin 81 MG tablet, Take 81 mg by mouth daily., Disp: , Rfl:  .  calcium carbonate (OS-CAL) 600 MG TABS tablet, Take 600 mg by mouth daily with breakfast., Disp: , Rfl:  .  dicyclomine (BENTYL) 10 MG capsule, Take 1 capsule (10 mg total) by mouth 4 (four) times daily -  before meals and at bedtime. Hold for constipation, Disp: 120 capsule, Rfl: 1 .  folic acid (FOLVITE) 1 MG tablet, Take 1 mg by mouth daily., Disp: , Rfl:  .  methotrexate (RHEUMATREX) 2.5 MG tablet, Take 25 mg by mouth every Sunday. Pt takes 10 tablets weekly. Caution:Chemotherapy. Protect from light., Disp: , Rfl:  .  predniSONE (DELTASONE) 5 MG tablet, Take 7.5 mg by mouth daily. , Disp: , Rfl:  .  Tocilizumab 162 MG/0.9ML SOSY, Inject into the skin every 30 (thirty) days. Pt thinks dose may be 400mg , Disp: , Rfl:  .  traMADol (ULTRAM) 50 MG tablet, Take 1 tablet (50 mg total) by mouth every 6 (six) hours as needed., Disp: 20 tablet, Rfl: 0 .  traZODone (DESYREL) 100 MG tablet, Take 100 mg by mouth at bedtime as needed for sleep., Disp: , Rfl:  Social History   Socioeconomic History  . Marital status: Married    Spouse name: Not on file  . Number of children: 0  . Years of education: Not on file  . Highest  education level: Not on file  Social Needs  . Financial resource strain: Not on file  . Food insecurity - worry: Not on file  . Food insecurity - inability: Not on file  . Transportation needs - medical: Not on file  . Transportation needs - non-medical: Not on file  Occupational History  . Occupation: Horticulturist, commercial  Tobacco Use  . Smoking status: Former Smoker    Packs/day: 1.00    Years: 39.00    Pack years: 39.00    Types: Cigarettes    Start date: 07/11/1975    Last attempt to quit: 04/26/2014    Years since quitting: 3.3  . Smokeless tobacco: Never Used  . Tobacco comment: Quit x 3 years  Substance  and Sexual Activity  . Alcohol use: No    Alcohol/week: 0.0 oz  . Drug use: No  . Sexual activity: Not on file  Other Topics Concern  . Not on file  Social History Narrative  . Not on file   Family History  Problem Relation Age of Onset  . Heart attack Father   . Diabetes Mother   . Colon cancer Neg Hx   . Liver disease Neg Hx     Objective: Office vital signs reviewed. BP 139/87   Pulse 73   Temp (!) 96.9 F (36.1 C) (Oral)   Ht 5\' 11"  (1.803 m)   Wt 229 lb (103.9 kg)   BMI 31.94 kg/m   Physical Examination:  General: Awake, alert, overweight, nontoxic appearing, appears uncomfortable during exam. Cardio: regular rate and rhythm, S1S2 heard, no murmurs appreciated Pulm: clear to auscultation bilaterally, no wheezes, rhonchi or rales; normal work of breathing on room air GI: distended, +generalized TTP w/ increased TTP in RLQ, bowel sounds slight hypoactive but present x4, no palpable masses.  He is belching during exam and this has a feculent odor.  Assessment/ Plan: 61 y.o. male   1. Constipation, unspecified constipation type I reviewed his recent gastroenterology note and emergency department notes.  CT abdomen report was reviewed which did not reveal focal findings to explain his abdominal pain.  His labs were fairly unremarkable.  These study results were reviewed with the patient and his wife.  There is no evidence of intra-abdominal infection.  Patient is afebrile, nontoxic-appearing with normal vital signs.  His abdominal exam is significant for distention, generalized tenderness to palpation with a predominance in the right lower quadrant.  Bowel sounds are slightly hypoactive but present in all 4 quadrants.  There were no palpable masses.  He was belching quite a bit during today's exam and this had a feculent odor.  Based on his exam, I am concerned that he has severe constipation causing his symptoms.  He has not had a bowel movement in 2 days.  I have prescribed  him MiraLAX with instructions for cleanout.  I recommended that he continue stool softener while taking tramadol, which he may increase to 100 mg twice daily if needed for persistent symptoms for the next 2 days.  I stressed the importance of water consumption.  He has Bentyl at the pharmacy already provided by his gastroenterologist.  I did encourage him to pick this medication up and use if symptoms are unrelieved after having substantial bowel movement.  Strict return precautions and reasons for emergent evaluation in the emergency department review with patient.  They voiced understanding and will follow-up as needed.  2. Right lower quadrant abdominal pain CT abdomen and recent labs were reviewed.  No evidence of acute appendicitis, diverticulitis.  No evidence of acute intra-abdominal infection or processes.  3. Abdominal bloating Likely secondary to severe constipation.  4. Aortic atherosclerosis (HCC) Incidentally found on CT abdomen.  I recommended the patient follow-up with his primary care doctor with regards to this finding.  He would most certainly benefit from a statin given this finding.  However, given his acute abdominal pain, this was not prescribed today.   Meds ordered this encounter  Medications  . polyethylene glycol powder (GLYCOLAX/MIRALAX) powder    Sig: Use as directed    Dispense:  255 g    Refill:  0     Samuell Knoble Windell Moulding, DO [place] [phone]

## 2017-09-01 NOTE — Telephone Encounter (Signed)
Spoke with pt. Pt needed clarity on the food maps he picked up in office today. Pt is aware that he needs to follow the low fodmap.

## 2017-09-01 NOTE — Telephone Encounter (Signed)
Pt called this morning with severe abdominal pain which is keeping him from sleeping. Pt is taking Tramadol up to bid daily for pain and it's not helping at all. Pt also reports severe gas. Pt asked what type of things he should eat for a low residue diet. Pt advised to google low residue diet. When I spoke with pt yesterday, I mentioned several foods low in fiber. Please advise.  Spoke with AB, Pt needs to follow a FODMAP and Soft and Mechanical Soft Diet. I have placed this info up front for pt to pick up.   Bentyl will be sent to pts pharmacy. Pt also advised to see his PCP to r/o anything else.

## 2017-09-02 ENCOUNTER — Inpatient Hospital Stay (HOSPITAL_COMMUNITY)
Admission: EM | Admit: 2017-09-02 | Discharge: 2017-09-09 | DRG: 872 | Disposition: A | Discharge status: Home / Self Care | Payer: BLUE CROSS/BLUE SHIELD | Attending: Internal Medicine | Admitting: Internal Medicine

## 2017-09-02 ENCOUNTER — Emergency Department (HOSPITAL_COMMUNITY): Payer: BLUE CROSS/BLUE SHIELD

## 2017-09-02 ENCOUNTER — Encounter (HOSPITAL_COMMUNITY): Payer: Self-pay | Admitting: Emergency Medicine

## 2017-09-02 ENCOUNTER — Other Ambulatory Visit: Payer: Self-pay

## 2017-09-02 DIAGNOSIS — D899 Disorder involving the immune mechanism, unspecified: Secondary | ICD-10-CM | POA: Diagnosis not present

## 2017-09-02 DIAGNOSIS — A419 Sepsis, unspecified organism: Principal | ICD-10-CM | POA: Diagnosis present

## 2017-09-02 DIAGNOSIS — R1084 Generalized abdominal pain: Secondary | ICD-10-CM | POA: Diagnosis not present

## 2017-09-02 DIAGNOSIS — Z4659 Encounter for fitting and adjustment of other gastrointestinal appliance and device: Secondary | ICD-10-CM

## 2017-09-02 DIAGNOSIS — R1030 Lower abdominal pain, unspecified: Secondary | ICD-10-CM | POA: Diagnosis present

## 2017-09-02 DIAGNOSIS — K5900 Constipation, unspecified: Secondary | ICD-10-CM | POA: Diagnosis present

## 2017-09-02 DIAGNOSIS — Z86010 Personal history of colonic polyps: Secondary | ICD-10-CM

## 2017-09-02 DIAGNOSIS — K5730 Diverticulosis of large intestine without perforation or abscess without bleeding: Secondary | ICD-10-CM | POA: Diagnosis present

## 2017-09-02 DIAGNOSIS — Z0189 Encounter for other specified special examinations: Secondary | ICD-10-CM

## 2017-09-02 DIAGNOSIS — Z87891 Personal history of nicotine dependence: Secondary | ICD-10-CM

## 2017-09-02 DIAGNOSIS — Z79899 Other long term (current) drug therapy: Secondary | ICD-10-CM

## 2017-09-02 DIAGNOSIS — K567 Ileus, unspecified: Secondary | ICD-10-CM

## 2017-09-02 DIAGNOSIS — D849 Immunodeficiency, unspecified: Secondary | ICD-10-CM

## 2017-09-02 DIAGNOSIS — R402412 Glasgow coma scale score 13-15, at arrival to emergency department: Secondary | ICD-10-CM | POA: Diagnosis present

## 2017-09-02 DIAGNOSIS — Z7952 Long term (current) use of systemic steroids: Secondary | ICD-10-CM

## 2017-09-02 DIAGNOSIS — K56609 Unspecified intestinal obstruction, unspecified as to partial versus complete obstruction: Secondary | ICD-10-CM

## 2017-09-02 DIAGNOSIS — K529 Noninfective gastroenteritis and colitis, unspecified: Secondary | ICD-10-CM | POA: Diagnosis present

## 2017-09-02 DIAGNOSIS — Z7982 Long term (current) use of aspirin: Secondary | ICD-10-CM

## 2017-09-02 DIAGNOSIS — Z6841 Body Mass Index (BMI) 40.0 and over, adult: Secondary | ICD-10-CM | POA: Diagnosis not present

## 2017-09-02 DIAGNOSIS — R109 Unspecified abdominal pain: Secondary | ICD-10-CM

## 2017-09-02 DIAGNOSIS — M069 Rheumatoid arthritis, unspecified: Secondary | ICD-10-CM | POA: Diagnosis present

## 2017-09-02 DIAGNOSIS — K56600 Partial intestinal obstruction, unspecified as to cause: Secondary | ICD-10-CM | POA: Diagnosis present

## 2017-09-02 DIAGNOSIS — M0579 Rheumatoid arthritis with rheumatoid factor of multiple sites without organ or systems involvement: Secondary | ICD-10-CM | POA: Diagnosis not present

## 2017-09-02 LAB — CBC WITH DIFFERENTIAL/PLATELET
BASOS ABS: 0 10*3/uL (ref 0.0–0.1)
BASOS PCT: 0 %
EOS PCT: 1 %
Eosinophils Absolute: 0.1 10*3/uL (ref 0.0–0.7)
HCT: 46.1 % (ref 39.0–52.0)
HEMOGLOBIN: 16.2 g/dL (ref 13.0–17.0)
Lymphocytes Relative: 7 %
Lymphs Abs: 1.1 10*3/uL (ref 0.7–4.0)
MCH: 34.5 pg — AB (ref 26.0–34.0)
MCHC: 35.1 g/dL (ref 30.0–36.0)
MCV: 98.3 fL (ref 78.0–100.0)
Monocytes Absolute: 1 10*3/uL (ref 0.1–1.0)
Monocytes Relative: 6 %
NEUTROS PCT: 86 %
Neutro Abs: 14.5 10*3/uL — ABNORMAL HIGH (ref 1.7–7.7)
PLATELETS: 325 10*3/uL (ref 150–400)
RBC: 4.69 MIL/uL (ref 4.22–5.81)
RDW: 15.6 % — ABNORMAL HIGH (ref 11.5–15.5)
WBC: 16.7 10*3/uL — AB (ref 4.0–10.5)

## 2017-09-02 LAB — BASIC METABOLIC PANEL
ANION GAP: 10 (ref 5–15)
BUN: 20 mg/dL (ref 6–20)
CO2: 23 mmol/L (ref 22–32)
Calcium: 9.3 mg/dL (ref 8.9–10.3)
Chloride: 103 mmol/L (ref 101–111)
Creatinine, Ser: 1 mg/dL (ref 0.61–1.24)
Glucose, Bld: 120 mg/dL — ABNORMAL HIGH (ref 65–99)
POTASSIUM: 3.6 mmol/L (ref 3.5–5.1)
SODIUM: 136 mmol/L (ref 135–145)

## 2017-09-02 LAB — I-STAT CHEM 8, ED
BUN: 20 mg/dL (ref 6–20)
CREATININE: 0.9 mg/dL (ref 0.61–1.24)
Calcium, Ion: 1.17 mmol/L (ref 1.15–1.40)
Chloride: 104 mmol/L (ref 101–111)
Glucose, Bld: 120 mg/dL — ABNORMAL HIGH (ref 65–99)
HEMATOCRIT: 52 % (ref 39.0–52.0)
HEMOGLOBIN: 17.7 g/dL — AB (ref 13.0–17.0)
Potassium: 3.7 mmol/L (ref 3.5–5.1)
Sodium: 141 mmol/L (ref 135–145)
TCO2: 25 mmol/L (ref 22–32)

## 2017-09-02 LAB — LACTIC ACID, PLASMA
Lactic Acid, Venous: 1.3 mmol/L (ref 0.5–1.9)
Lactic Acid, Venous: 1.4 mmol/L (ref 0.5–1.9)

## 2017-09-02 LAB — LIPASE, BLOOD: LIPASE: 31 U/L (ref 11–51)

## 2017-09-02 LAB — I-STAT CG4 LACTIC ACID, ED: LACTIC ACID, VENOUS: 1.6 mmol/L (ref 0.5–1.9)

## 2017-09-02 MED ORDER — CIPROFLOXACIN IN D5W 400 MG/200ML IV SOLN
400.0000 mg | Freq: Once | INTRAVENOUS | Status: AC
  Administered 2017-09-02: 400 mg via INTRAVENOUS
  Filled 2017-09-02: qty 200

## 2017-09-02 MED ORDER — METRONIDAZOLE IN NACL 5-0.79 MG/ML-% IV SOLN
500.0000 mg | Freq: Once | INTRAVENOUS | Status: AC
  Administered 2017-09-02: 500 mg via INTRAVENOUS
  Filled 2017-09-02: qty 100

## 2017-09-02 MED ORDER — MORPHINE SULFATE (PF) 4 MG/ML IV SOLN
4.0000 mg | Freq: Once | INTRAVENOUS | Status: AC
  Administered 2017-09-02: 4 mg via INTRAVENOUS
  Filled 2017-09-02: qty 1

## 2017-09-02 MED ORDER — ONDANSETRON HCL 4 MG/2ML IJ SOLN
4.0000 mg | Freq: Once | INTRAMUSCULAR | Status: AC
  Administered 2017-09-02: 4 mg via INTRAVENOUS
  Filled 2017-09-02: qty 2

## 2017-09-02 MED ORDER — SODIUM CHLORIDE 0.9 % IV BOLUS (SEPSIS): 1000.0000 mL | Freq: Once | INTRAVENOUS | Status: DC

## 2017-09-02 MED ORDER — METRONIDAZOLE IN NACL 5-0.79 MG/ML-% IV SOLN
500.0000 mg | Freq: Three times a day (TID) | INTRAVENOUS | Status: DC
  Administered 2017-09-02 – 2017-09-08 (×18): 500 mg via INTRAVENOUS
  Filled 2017-09-02 (×18): qty 100

## 2017-09-02 MED ORDER — METOCLOPRAMIDE HCL 5 MG/ML IJ SOLN
10.0000 mg | Freq: Once | INTRAMUSCULAR | Status: AC
  Administered 2017-09-02: 10 mg via INTRAVENOUS
  Filled 2017-09-02: qty 2

## 2017-09-02 MED ORDER — IOPAMIDOL (ISOVUE-370) INJECTION 76%
100.0000 mL | Freq: Once | INTRAVENOUS | Status: AC | PRN
  Administered 2017-09-02: 100 mL via INTRAVENOUS

## 2017-09-02 MED ORDER — ENOXAPARIN SODIUM 60 MG/0.6ML ~~LOC~~ SOLN
50.0000 mg | SUBCUTANEOUS | Status: DC
  Administered 2017-09-02 – 2017-09-09 (×7): 50 mg via SUBCUTANEOUS
  Filled 2017-09-02 (×2): qty 0.6
  Filled 2017-09-02: qty 0.5
  Filled 2017-09-02 (×6): qty 0.6

## 2017-09-02 MED ORDER — SODIUM CHLORIDE 0.9 % IV SOLN
INTRAVENOUS | Status: DC
Start: 2017-09-02 — End: 2017-09-03
  Administered 2017-09-02 – 2017-09-03 (×4): via INTRAVENOUS

## 2017-09-02 MED ORDER — IOPAMIDOL (ISOVUE-370) INJECTION 76%
INTRAVENOUS | Status: AC
  Filled 2017-09-02: qty 100

## 2017-09-02 MED ORDER — PREDNISONE 5 MG PO TABS
7.5000 mg | ORAL_TABLET | Freq: Every day | ORAL | Status: DC
  Administered 2017-09-02 – 2017-09-09 (×6): 7.5 mg via ORAL
  Filled 2017-09-02 (×8): qty 1

## 2017-09-02 MED ORDER — CIPROFLOXACIN IN D5W 400 MG/200ML IV SOLN
400.0000 mg | Freq: Two times a day (BID) | INTRAVENOUS | Status: DC
  Administered 2017-09-02 – 2017-09-07 (×11): 400 mg via INTRAVENOUS
  Filled 2017-09-02 (×12): qty 200

## 2017-09-02 MED ORDER — ONDANSETRON HCL 4 MG/2ML IJ SOLN
4.0000 mg | Freq: Four times a day (QID) | INTRAMUSCULAR | Status: DC | PRN
Start: 2017-09-02 — End: 2017-09-09
  Administered 2017-09-02 – 2017-09-06 (×6): 4 mg via INTRAVENOUS
  Filled 2017-09-02 (×6): qty 2

## 2017-09-02 MED ORDER — ACETAMINOPHEN 325 MG PO TABS
650.0000 mg | ORAL_TABLET | Freq: Four times a day (QID) | ORAL | Status: DC | PRN
  Administered 2017-09-04: 650 mg via ORAL
  Filled 2017-09-02: qty 2

## 2017-09-02 MED ORDER — ALUM & MAG HYDROXIDE-SIMETH 200-200-20 MG/5ML PO SUSP
30.0000 mL | Freq: Once | ORAL | Status: AC
  Administered 2017-09-02: 30 mL via ORAL
  Filled 2017-09-02: qty 30

## 2017-09-02 MED ORDER — ACETAMINOPHEN 650 MG RE SUPP: 650.0000 mg | Freq: Four times a day (QID) | RECTAL | Status: DC | PRN

## 2017-09-02 MED ORDER — HYDROMORPHONE HCL 1 MG/ML IJ SOLN
0.5000 mg | INTRAMUSCULAR | Status: DC | PRN
  Administered 2017-09-02 – 2017-09-04 (×6): 0.5 mg via INTRAVENOUS
  Filled 2017-09-02 (×6): qty 1

## 2017-09-02 MED ORDER — ONDANSETRON HCL 4 MG PO TABS: 4.0000 mg | ORAL_TABLET | Freq: Four times a day (QID) | ORAL | Status: DC | PRN

## 2017-09-02 MED ORDER — METOCLOPRAMIDE HCL 5 MG/ML IJ SOLN
5.0000 mg | Freq: Three times a day (TID) | INTRAMUSCULAR | Status: DC | PRN
  Administered 2017-09-02 – 2017-09-06 (×3): 5 mg via INTRAVENOUS
  Filled 2017-09-02 (×3): qty 2

## 2017-09-02 NOTE — H&P (Addendum)
History and Physical    [name]:[id number] DOB: [birth year] DOA: 09/02/2017  PCP: Claretta Fraise, MD  Patient coming from: Home  Chief Complaint: Nausea, abdominal pain, gas  HPI: Ronald Cameron is a 61 y.o. male with medical history significant of rheumatoid arthritis on chronic immunosuppressive therapy, morbid obesity who presented to the emergency department with complaints of worsening abdominal distention with abdominal pain and nausea.  Symptoms actually began on 08/28/2017.  On that date after dinner, patient recalled having increased gas both passing flatus as well as belching.  Symptoms were associated with intermittent abdominal pains.  Symptoms did not improve and patient subsequently presented to his gastroenterologist (Dr. [name]).  During evaluation, patient was initially planned for CT scanning, however patient was instead referred to the emergency department at [facility] for more immediate workup.  In the emergency department, patient underwent CT scan of the abdomen and pelvis which was later found to be unremarkable.  Patient was referred to his primary care provider who treated patient for possible constipation.  Patient was unable to tolerate miralax.  Symptoms continue to worsen and patient subsequently presented to the emergency department.  Note, patient reports last bowel movement was 3 days prior to this hospital admission and was noted to be "normal."  ED Course: In the Emergency department, patient underwent CT angios abdomen/pelvis with findings suggesting enteritis versus colitis with no evidence of bowel obstruction.  Patient was found to have a white blood count in excess of 16,000.  Oral temperature was noted to be as low as 96 F.  General surgery was called by the emergency room physician.  Surgical recommendations were for a medical management and pain control.  Patient was started on empiric ciprofloxacin and Flagyl.  Hospitalist service  consulted for consideration for admission.  Review of Systems:  Review of Systems  Constitutional: Negative for chills, fever, malaise/fatigue and weight loss.  HENT: Negative for congestion, ear pain, sinus pain and tinnitus.   Eyes: Negative for double vision, photophobia and pain.  Respiratory: Negative for hemoptysis, sputum production and shortness of breath.   Cardiovascular: Negative for palpitations, orthopnea and claudication.  Gastrointestinal: Positive for abdominal pain and nausea. Negative for constipation and diarrhea.  Genitourinary: Negative for frequency, hematuria and urgency.  Musculoskeletal: Positive for joint pain. Negative for back pain and neck pain.  Neurological: Negative for tingling, tremors, seizures, loss of consciousness and weakness.  Psychiatric/Behavioral: Negative for hallucinations, memory loss and substance abuse.    Past Medical History:  Diagnosis Date  . Collagen vascular disease (HCC)    Rheumatoid Arthritis  . Rheumatoid arthritis [facility])     Past Surgical History:  Procedure Laterality Date  . COLONOSCOPY N/A 04/13/2016   Dr. [name]: 14 mm polyp in the ascending colon removed, diverticulosis in the distal descending colon and sigmoid colon, nonbleeding internal hemorrhoids. Pathology-tubular adenoma. Next colonoscopy 5 years.  . ELBOW SURGERY     X 3 last year. Staph infection but not MRSA per patient. infectious disease. out of work 6 months.   . TONSILLECTOMY       reports that he quit smoking about 3 years ago. His smoking use included cigarettes. He started smoking about 42 years ago. He has a 39.00 pack-year smoking history. he has never used smokeless tobacco. He reports that he does not drink alcohol or use drugs.  No Known Allergies  Family History  Problem Relation Age of Onset  . Heart attack Father   . Diabetes Mother   .  Colon cancer Neg Hx   . Liver disease Neg Hx     Prior to Admission medications   Medication Sig Start  Date End Date Taking? Authorizing Provider  Ascorbic Acid (VITAMIN C) 1000 MG tablet Take 1,000 mg by mouth daily.   Yes [provider]  aspirin 81 MG tablet Take 81 mg by mouth daily.   Yes [provider]  Calcium Carb-Cholecalciferol (CALCIUM-VITAMIN D) 600-400 MG-UNIT TABS Take 1 tablet by mouth daily.   Yes [provider]  folic acid (FOLVITE) 1 MG tablet Take 1 mg by mouth daily.   Yes [provider]  methotrexate (RHEUMATREX) 2.5 MG tablet Take 25 mg by mouth every Sunday. Pt takes 10 tablets weekly. Caution:Chemotherapy. Protect from light.   Yes [provider]  polyethylene glycol powder (GLYCOLAX/MIRALAX) powder Use as directed Patient taking differently: Take 17-34 g by mouth daily as needed for mild constipation. Use as directed 09/01/17  Yes Gottschalk, Ashly M, DO  predniSONE (DELTASONE) 5 MG tablet Take 7.5 mg by mouth daily.    Yes [provider]  Simethicone (GAS-X EXTRA STRENGTH) 125 MG CAPS Take 125-375 mg by mouth daily as needed (gas).   Yes [provider]  tocilizumab (ACTEMRA) 400 MG/20ML SOLN injection Inject 400 mg into the vein every 30 (thirty) days.   Yes [provider]  traMADol (ULTRAM) 50 MG tablet Take 1 tablet (50 mg total) by mouth every 6 (six) hours as needed. Patient taking differently: Take 50-100 mg by mouth every 6 (six) hours as needed for moderate pain.  08/31/17  Yes Milton Ferguson, MD  traZODone (DESYREL) 100 MG tablet Take 100 mg by mouth at bedtime as needed for sleep.   Yes [provider]  dicyclomine (BENTYL) 10 MG capsule Take 1 capsule (10 mg total) by mouth 4 (four) times daily -  before meals and at bedtime. Hold for constipation 09/01/17 10/01/17  Annitta Needs, NP    Physical Exam: Vitals:   09/02/17 0514  BP: (!) 141/104  Pulse: 89  Resp: 18  Temp: 97.9 F (36.6 C)  TempSrc: Oral  SpO2: 98%    Constitutional: NAD, calm, comfortable Vitals:    09/02/17 0514  BP: (!) 141/104  Pulse: 89  Resp: 18  Temp: 97.9 F (36.6 C)  TempSrc: Oral  SpO2: 98%   Eyes: PERRL, lids and conjunctivae normal ENMT: Mucous membranes are moist. Posterior pharynx clear of any exudate or lesions.Normal dentition.  Neck: normal, supple, no masses, no thyromegaly Respiratory: clear to auscultation bilaterally, no wheezing, no crackles. Normal respiratory effort. No accessory muscle use.  Cardiovascular: Regular rate and rhythm, no murmurs / rubs / gallops. No extremity edema. 2+ pedal pulses. No carotid bruits.  Abdomen: pos BS, distended, generally tender in all quadrants Musculoskeletal: no clubbing / cyanosis. No joint deformity upper and lower extremities. Good ROM, no contractures. Normal muscle tone.  Skin: no rashes, lesions, ulcers. No induration Neurologic: CN 2-12 grossly intact. Sensation intact, DTR normal. Strength 5/5 in all 4.  Psychiatric: Normal judgment and insight. Alert and oriented x 3. Normal mood.    Labs on Admission: I have personally reviewed following labs and imaging studies  CBC: Recent Labs  Lab 08/31/17 1043 09/02/17 0546 09/02/17 0559  WBC 5.9 16.7*  --   NEUTROABS  --  14.5*  --   HGB 16.3 16.2 17.7*  HCT 49.6 46.1 52.0  MCV 101.0* 98.3  --   PLT 328 325  --  Basic Metabolic Panel: Recent Labs  Lab 08/31/17 1043 09/02/17 0546 09/02/17 0559  NA 138 136 141  K 3.6 3.6 3.7  CL 106 103 104  CO2 25 23  --   GLUCOSE 97 120* 120*  BUN 22* 20 20  CREATININE 1.05 1.00 0.90  CALCIUM 9.3 9.3  --    GFR: Estimated Creatinine Clearance: 108.4 mL/min (by C-G formula based on SCr of 0.9 mg/dL). Liver Function Tests: Recent Labs  Lab 08/31/17 1043  AST 40  ALT 56  ALKPHOS 44  BILITOT 0.9  PROT 7.5  ALBUMIN 4.2   Recent Labs  Lab 08/31/17 1043 09/02/17 0546  LIPASE 36 31   No results for input(s): AMMONIA in the last 168 hours. Coagulation Profile: No results for input(s): INR, PROTIME in the  last 168 hours. Cardiac Enzymes: No results for input(s): CKTOTAL, CKMB, CKMBINDEX, TROPONINI in the last 168 hours. BNP (last 3 results) No results for input(s): PROBNP in the last 8760 hours. HbA1C: No results for input(s): HGBA1C in the last 72 hours. CBG: No results for input(s): GLUCAP in the last 168 hours. Lipid Profile: No results for input(s): CHOL, HDL, LDLCALC, TRIG, CHOLHDL, LDLDIRECT in the last 72 hours. Thyroid Function Tests: No results for input(s): TSH, T4TOTAL, FREET4, T3FREE, THYROIDAB in the last 72 hours. Anemia Panel: No results for input(s): VITAMINB12, FOLATE, FERRITIN, TIBC, IRON, RETICCTPCT in the last 72 hours. Urine analysis:    Component Value Date/Time   COLORURINE YELLOW 08/31/2017 1043   APPEARANCEUR CLEAR 08/31/2017 1043   LABSPEC 1.023 08/31/2017 1043   PHURINE 6.0 08/31/2017 1043   GLUCOSEU NEGATIVE 08/31/2017 1043   HGBUR NEGATIVE 08/31/2017 1043   BILIRUBINUR NEGATIVE 08/31/2017 1043   KETONESUR NEGATIVE 08/31/2017 1043   PROTEINUR NEGATIVE 08/31/2017 1043   NITRITE NEGATIVE 08/31/2017 1043   LEUKOCYTESUR NEGATIVE 08/31/2017 1043   Sepsis Labs: !!!!!!!!!!!!!!!!!!!!!!!!!!!!!!!!!!!!!!!!!!!! @LABRCNTIP (procalcitonin:4,lacticidven:4) )No results found for this or any previous visit (from the past 240 hour(s)).   Radiological Exams on Admission: CT personally reviewed, findings suggestive of enteritis vs colitis Ct Abdomen Pelvis W Contrast  Result Date: 08/31/2017 CLINICAL DATA:  Acute lower abdominal pain. EXAM: CT ABDOMEN AND PELVIS WITH CONTRAST TECHNIQUE: Multidetector CT imaging of the abdomen and pelvis was performed using the standard protocol following bolus administration of intravenous contrast. CONTRAST:  154mL ISOVUE-300 IOPAMIDOL (ISOVUE-300) INJECTION 61% COMPARISON:  CT scan of March 02, 2015. FINDINGS: Lower chest: No acute abnormality. Hepatobiliary: No gallstones are noted. Focal fatty infiltration of the liver is noted.  Pancreas: Unremarkable. No pancreatic ductal dilatation or surrounding inflammatory changes. Spleen: Normal in size without focal abnormality. Adrenals/Urinary Tract: Adrenal glands are unremarkable. Small nonobstructive left renal calculi are noted. No hydronephrosis or renal obstruction is noted. Bladder is unremarkable. Stomach/Bowel: Stomach is within normal limits. Appendix appears normal. No evidence of bowel wall thickening, distention, or inflammatory changes. Sigmoid diverticulosis is noted without inflammation. Vascular/Lymphatic: Aortic atherosclerosis. No enlarged abdominal or pelvic lymph nodes. Reproductive: Prostate is unremarkable. Other: No abdominal wall hernia or abnormality. No abdominopelvic ascites. Musculoskeletal: No acute or significant osseous findings. IMPRESSION: Fatty infiltration of the liver. Sigmoid diverticulosis without inflammation. Aortic atherosclerosis. Nonobstructive left nephrolithiasis. No other abnormality seen in the abdomen or pelvis. Electronically Signed   By: Marijo Conception, M.D.   On: 08/31/2017 14:41   Ct Angio Abd/pel W And/or Wo Contrast  Result Date: 09/02/2017 CLINICAL DATA:  Abdominal pain.  Evaluate for mesenteric ischemia. EXAM: CTA ABDOMEN AND PELVIS wITHOUT AND WITH CONTRAST TECHNIQUE:  Multidetector CT imaging of the abdomen and pelvis was performed using the standard protocol during with Bolus administration of intravenous contrast. Multiplanar reconstructed images and MIPs were obtained and reviewed to evaluate the vascular anatomy. CONTRAST:  17mL ISOVUE-370 IOPAMIDOL (ISOVUE-370) INJECTION 76% COMPARISON:  CT 08/31/2017 FINDINGS: VASCULAR Aorta: Aorta normal caliber.  Minimal intimal calcification. Celiac: Widely patent. No ostial calcification. Typical with 3 branch anatomy. SMA: Widely patent with no ostial calcification. The branches SMA are patent with without evidence of occlusion for thrombus with pre with with Renals: Hip with go with  bilateral patent single renal artery loose IMA: Patent widely patent with. Inflow: With mild intimal calcification no aneurysm with Proximal Outflow: Normal Veins: SMV appendix feeding branches are normal. Portal vein normal) with in Review of the MIP images confirms the above findings. NON-VASCULAR Lower chest: A thickening mild linear atelectasis. Mild interlobular septal thickening Hepatobiliary: Stroke. Normal liver. Normal gallbladder. No duct dilatation Pancreas: No inflammation normal duct Spleen: Normal spleen Adrenals/Urinary Tract: Adrenal glands and kidneys are normal. Ureters and bladder normal Stomach/Bowel: Stomach, duodenum normal. Bowel is normal caliber. No mucosal enhancement bowel wall thickening. No pneumatosis. There is some fluid within the mesentery of the RIGHT lower quadrant adjacent the small bowel and sigmoid colon (image 153, series 7). There is mild bowel wall thickening of a loop of small bowel through this region (image 135, series 7). Cecum is normal. Appendix not identified. Ascending, transverse and descending colon normal. Several diverticula through the sigmoid region. No acute inflammation. Rectum normal Lymphatic: No lymphadenopathy Reproductive: Normal prostate Other: No free-fluid Musculoskeletal: No aggressive osseous lesion IMPRESSION: VASCULAR 1. Celiac trunk, SMA, and IMA are widely patent. No ostial calcification. 2. SMV patent 3. Mild intimal calcification of the aorta and iliac arteries NON-VASCULAR 1. Small amount of fluid in the mesentery of RIGHT lower quadrant adjacent to the sigmoid colon and distal small bowel. Mild bowel wall edema of a loop of small bowel this region. Nonspecific finding which could represent enteritis or colitis. Favor small bowel inflammation with differential including infectious enteritis or ischemic enteritis. No evidence bowel obstruction. 2. No free air or pneumatosis.  No portal venous gas. Electronically Signed   By: Suzy Bouchard  M.D.   On: 09/02/2017 07:51     Assessment/Plan Principal Problem:   Colitis Active Problems:   Rheumatoid arthritis ([place])   Lower abdominal pain   Immunosuppressed status ([place])   Morbid obesity with body mass index of 40.0-49.9 ([place])   1. Colitis with sepsis present on admission 1. CT abd reviewed with findings suggestive of enteritis vs colitis 2. Pt with leukocytosis of 16k and noted to have oral temp of 31F in ED 3. EDP discussed case with General Surgery who recommended medical management 4. Patient given one dose of cipro/flagyl, will continue 5. Lactate unremarkable. Will repeat in 3 hrs 6. Repeat abd xray in AM for interval change 7. Had discussed case with patient's primary gastroenterologist who agrees with bowel rest and empiric abx per above. Dr. [name] to follow up with patient when ultimately discharged 2. Rheumoatoid arthritis 1. Will hold immuno-modulators given concerns of acute infection 3. Immunosuppressed state 1. Patient on immuno-modulators per above chronically 2. Will hold secondary to above, with exception of prednisone as pt has been on steroids chronically 4. Morbid obesity 1. Stable at this time 2. Morbid obesity places patient at higher risk for morbidity and mortality 5. Leukocytosis 1. Likely secondary to above colitis/enteritis 2. Repeat cbc in AM  DVT  prophylaxis: Lovenox subQ  Code Status: Full Family Communication: Pt in room  Disposition Plan: Uncertain at this time  Consults called: EDP discussed case with General Surgery Admission status: Inpatient as would likely require greater than 2 midnight stay for IV antibiotics in an immunosuppressed, morbidly obese patient   Marylu Lund MD [facility] Pager [phone]  If 7PM-7AM, please contact night-coverage [email or web address] Password [email or web address]  09/02/2017, 8:52 AM

## 2017-09-02 NOTE — ED Provider Notes (Signed)
Complains of abdominal pain onset 5 days ago.  He treated himself with MiraLAX last night he is vomited 2-3 times since last night.  Other associated symptoms include subjective fever.  Admits to mild nausea present abdominal pain is worse with moving and improved with sitting still.  On exam  alert Glasgow Coma Score 15 nontoxic-appearing abdomen distended, diffusely tender.  Genitalia normal male.  I discussed case with Dr. gross, surgeon who reviewed CT scan does not feel that he needs to intervene presently .patient will be treated with intravenous antibiotics as well as symptomatic treatment for nausea and pain.  Dr Wyline Copas from hospital service consulted, saw patient and arrange for overnight stay and IV antibiotics ordered.  9:15 AM patient feels improved after treatment with intravenous opioids and antiemetics and IV hydration  Doubt ischemic colitis.  Patient has normal lactate.Orlie Dakin, MD 09/02/17 [phone]

## 2017-09-02 NOTE — ED Triage Notes (Signed)
Pt arriving from home with complaints of abdominal that has been present since Monday and vomiting which began last night. Pt reports he has been seen by multiple providers recently including a gastroenterology specialist. Pt A&O x4. Pt is currently diaphoretic.

## 2017-09-02 NOTE — ED Provider Notes (Signed)
[facility] DEPT Provider Note   CSN: [id number] Arrival date & time: 09/02/17  0448     History   Chief Complaint Chief Complaint  Patient presents with  . Abdominal Pain  . Emesis    HPI Ronald Cameron is a 61 y.o. male.  61 yo M with a chief complaint of abdominal pain.  This been going on for the past week or so.  The patient was seen yesterday because the pain had gotten significantly worse.  He had been constipated and been trying home remedies without improvement.  CT scan was done yesterday with no acute pathology.  He was sent home, saw his family physician who recommended a MiraLAX cleanout.  The patient attempted to do so but was unable to tolerate it and vomited multiple times.  He feels that his pain is significantly worsened.  He feels that his abdomen is more distended.  Describes the pain is sharp and diffuse.  Worse in the right lower quadrant.  Denies fevers.  Has been passing gas through his bottom.  Is also been belching frequently.  Denies prior abdominal surgery.   The history is provided by the patient.  Abdominal Pain   This is a new problem. The current episode started more than 2 days ago. The problem occurs constantly. The problem has been rapidly worsening. The pain is associated with eating. The pain is located in the generalized abdominal region. The quality of the pain is sharp and shooting. The pain is at a severity of 10/10. The pain is severe. Associated symptoms include nausea, vomiting and constipation. Pertinent negatives include fever, diarrhea, headaches, arthralgias and myalgias. Nothing aggravates the symptoms. Nothing relieves the symptoms. Past workup includes CT scan.  Emesis   Associated symptoms include abdominal pain. Pertinent negatives include no arthralgias, no chills, no diarrhea, no fever, no headaches and no myalgias.    Past Medical History:  Diagnosis Date  . Collagen vascular disease (HCC)    Rheumatoid Arthritis  . Rheumatoid arthritis [facility])     Patient Active Problem List   Diagnosis Date Noted  . Aortic atherosclerosis ([place]) 09/01/2017  . Acute bilateral lower abdominal pain 08/31/2017  . Diarrhea 11/17/2016  . Abnormal hepatitis serology 11/17/2016  . Renal lesion 05/06/2016  . History of colonic polyps   . Diverticulosis of colon without hemorrhage   . Bloating 04/08/2016  . Encounter for screening colonoscopy 04/08/2016  . Diverticulitis of colon without hemorrhage 04/06/2015  . Fatty liver 04/06/2015  . Early satiety 02/20/2015  . Abdominal pain, epigastric 02/20/2015  . Abdominal swelling, generalized 02/20/2015  . High risk medication use 02/20/2015  . Septic olecranon bursitis of right elbow 12/16/2013  . Pain in elbow 12/16/2013  . EXTERNAL HEMORRHOIDS 12/18/2008  . GERD 12/18/2008  . NEPHROLITHIASIS, HX OF 12/18/2008  . Rheumatoid arthritis ([place]) 12/17/2008  . Hemorrhoids 12/15/2008  . RECTAL BLEEDING 12/15/2008  . RECTAL PAIN 12/15/2008  . Lower abdominal pain 12/15/2008    Past Surgical History:  Procedure Laterality Date  . COLONOSCOPY N/A 04/13/2016   Dr. [name]: 14 mm polyp in the ascending colon removed, diverticulosis in the distal descending colon and sigmoid colon, nonbleeding internal hemorrhoids. Pathology-tubular adenoma. Next colonoscopy 5 years.  . ELBOW SURGERY     X 3 last year. Staph infection but not MRSA per patient. infectious disease. out of work 6 months.   . TONSILLECTOMY         Home Medications    Prior to Admission  medications   Medication Sig Start Date End Date Taking? Authorizing Provider  Ascorbic Acid (VITAMIN C) 1000 MG tablet Take 1,000 mg by mouth daily.    [provider]  aspirin 81 MG tablet Take 81 mg by mouth daily.    [provider]  calcium carbonate (OS-CAL) 600 MG TABS tablet Take 600 mg by mouth daily with breakfast.    [provider]  dicyclomine (BENTYL) 10 MG capsule  Take 1 capsule (10 mg total) by mouth 4 (four) times daily -  before meals and at bedtime. Hold for constipation 09/01/17 10/01/17  Annitta Needs, NP  folic acid (FOLVITE) 1 MG tablet Take 1 mg by mouth daily.    [provider]  methotrexate (RHEUMATREX) 2.5 MG tablet Take 25 mg by mouth every Sunday. Pt takes 10 tablets weekly. Caution:Chemotherapy. Protect from light.    [provider]  polyethylene glycol powder (GLYCOLAX/MIRALAX) powder Use as directed 09/01/17   Ronnie Doss M, DO  predniSONE (DELTASONE) 5 MG tablet Take 7.5 mg by mouth daily.     [provider]  Tocilizumab 162 MG/0.9ML SOSY Inject into the skin every 30 (thirty) days. Pt thinks dose may be 400mg     [provider]  traMADol (ULTRAM) 50 MG tablet Take 1 tablet (50 mg total) by mouth every 6 (six) hours as needed. 08/31/17   Milton Ferguson, MD  traZODone (DESYREL) 100 MG tablet Take 100 mg by mouth at bedtime as needed for sleep.    [provider]    Family History Family History  Problem Relation Age of Onset  . Heart attack Father   . Diabetes Mother   . Colon cancer Neg Hx   . Liver disease Neg Hx     Social History Social History   Tobacco Use  . Smoking status: Former Smoker    Packs/day: 1.00    Years: 39.00    Pack years: 39.00    Types: Cigarettes    Start date: 07/11/1975    Last attempt to quit: 04/26/2014    Years since quitting: 3.3  . Smokeless tobacco: Never Used  . Tobacco comment: Quit x 3 years  Substance Use Topics  . Alcohol use: No    Alcohol/week: 0.0 oz  . Drug use: No     Allergies   Patient has no known allergies.   Review of Systems Review of Systems  Constitutional: Negative for chills and fever.  HENT: Negative for congestion and facial swelling.   Eyes: Negative for discharge and visual disturbance.  Respiratory: Negative for shortness of breath.   Cardiovascular: Negative for chest pain and palpitations.    Gastrointestinal: Positive for abdominal pain, constipation, nausea and vomiting. Negative for diarrhea.  Musculoskeletal: Negative for arthralgias and myalgias.  Skin: Negative for color change and rash.  Neurological: Negative for tremors, syncope and headaches.  Psychiatric/Behavioral: Negative for confusion and dysphoric mood.     Physical Exam Updated Vital Signs BP (!) 141/104 (BP Location: Right Arm)   Pulse 89   Temp 97.9 F (36.6 C) (Oral)   Resp 18   SpO2 98%   Physical Exam  Constitutional: He is oriented to person, place, and time. He appears well-developed and well-nourished.  Pale on exam  HENT:  Head: Normocephalic and atraumatic.  Eyes: EOM are normal. Pupils are equal, round, and reactive to light.  Neck: Normal range of motion. Neck supple. No JVD present.  Cardiovascular: Normal rate and regular rhythm. Exam reveals no  gallop and no friction rub.  No murmur heard. Pulmonary/Chest: No respiratory distress. He has no wheezes.  Abdominal: Bowel sounds are normal. He exhibits distension (marked). There is generalized tenderness. There is guarding. There is no rebound.  Musculoskeletal: Normal range of motion.  Neurological: He is alert and oriented to person, place, and time.  Skin: No rash noted. No pallor.  Psychiatric: He has a normal mood and affect. His behavior is normal.  Nursing note and vitals reviewed.    ED Treatments / Results  Labs (all labs ordered are listed, but only abnormal results are displayed) Labs Reviewed  CBC WITH DIFFERENTIAL/PLATELET - Abnormal; Notable for the following components:      Result Value   WBC 16.7 (*)    MCH 34.5 (*)    RDW 15.6 (*)    Neutro Abs 14.5 (*)    All other components within normal limits  BASIC METABOLIC PANEL - Abnormal; Notable for the following components:   Glucose, Bld 120 (*)    All other components within normal limits  I-STAT CHEM 8, ED - Abnormal; Notable for the following components:    Glucose, Bld 120 (*)    Hemoglobin 17.7 (*)    All other components within normal limits  LIPASE, BLOOD  I-STAT CG4 LACTIC ACID, ED  I-STAT CG4 LACTIC ACID, ED    EKG  EKG Interpretation None       Radiology Ct Abdomen Pelvis W Contrast  Result Date: 08/31/2017 CLINICAL DATA:  Acute lower abdominal pain. EXAM: CT ABDOMEN AND PELVIS WITH CONTRAST TECHNIQUE: Multidetector CT imaging of the abdomen and pelvis was performed using the standard protocol following bolus administration of intravenous contrast. CONTRAST:  123mL ISOVUE-300 IOPAMIDOL (ISOVUE-300) INJECTION 61% COMPARISON:  CT scan of March 02, 2015. FINDINGS: Lower chest: No acute abnormality. Hepatobiliary: No gallstones are noted. Focal fatty infiltration of the liver is noted. Pancreas: Unremarkable. No pancreatic ductal dilatation or surrounding inflammatory changes. Spleen: Normal in size without focal abnormality. Adrenals/Urinary Tract: Adrenal glands are unremarkable. Small nonobstructive left renal calculi are noted. No hydronephrosis or renal obstruction is noted. Bladder is unremarkable. Stomach/Bowel: Stomach is within normal limits. Appendix appears normal. No evidence of bowel wall thickening, distention, or inflammatory changes. Sigmoid diverticulosis is noted without inflammation. Vascular/Lymphatic: Aortic atherosclerosis. No enlarged abdominal or pelvic lymph nodes. Reproductive: Prostate is unremarkable. Other: No abdominal wall hernia or abnormality. No abdominopelvic ascites. Musculoskeletal: No acute or significant osseous findings. IMPRESSION: Fatty infiltration of the liver. Sigmoid diverticulosis without inflammation. Aortic atherosclerosis. Nonobstructive left nephrolithiasis. No other abnormality seen in the abdomen or pelvis. Electronically Signed   By: Marijo Conception, M.D.   On: 08/31/2017 14:41    Procedures Procedures (including critical care time)  Medications Ordered in ED Medications  iopamidol  (ISOVUE-370) 76 % injection (not administered)  sodium chloride 0.9 % bolus 1,000 mL (1,000 mLs Intravenous New Bag/Given 09/02/17 0544)  morphine 4 MG/ML injection 4 mg (4 mg Intravenous Given 09/02/17 0543)  ondansetron (ZOFRAN) injection 4 mg (4 mg Intravenous Given 09/02/17 0543)  iopamidol (ISOVUE-370) 76 % injection 100 mL (100 mLs Intravenous Contrast Given 09/02/17 0620)     Initial Impression / Assessment and Plan / ED Course  I have reviewed the triage vital signs and the nursing notes.  Pertinent labs & imaging results that were available during my care of the patient were reviewed by me and considered in my medical decision making (see chart for details).     61 yo M  with a chief complaint of diffuse abdominal pain.  Patient has pain out of proportion on exam.  When I percuss his abdomen he screams in pain.  I will obtain a CT angiogram of the abdomen ischemia.  Obtain a lactate.  Give IV fluids and pain medicine.  Reassess.  Significant change in the white blood cell count.  CT scan was changes in the right lower quadrant on my view.  Awaiting the radiologist read.  I turned the patient over to Dr. Winfred Leeds.  Please see his note for further details.    The patients results and plan were reviewed and discussed.   Any x-rays performed were independently reviewed by myself.   Differential diagnosis were considered with the presenting HPI.  Medications  iopamidol (ISOVUE-370) 76 % injection (not administered)  sodium chloride 0.9 % bolus 1,000 mL (1,000 mLs Intravenous New Bag/Given 09/02/17 0544)  morphine 4 MG/ML injection 4 mg (4 mg Intravenous Given 09/02/17 0543)  ondansetron (ZOFRAN) injection 4 mg (4 mg Intravenous Given 09/02/17 0543)  iopamidol (ISOVUE-370) 76 % injection 100 mL (100 mLs Intravenous Contrast Given 09/02/17 0620)    Vitals:   09/02/17 0514  BP: (!) 141/104  Pulse: 89  Resp: 18  Temp: 97.9 F (36.6 C)  TempSrc: Oral  SpO2: 98%    Final diagnoses:    Generalized abdominal pain     Final Clinical Impressions(s) / ED Diagnoses   Final diagnoses:  Generalized abdominal pain    ED Discharge Orders    None       Deno Etienne, DO 09/02/17 6222

## 2017-09-02 NOTE — Progress Notes (Signed)
Ronald Cameron  [birth year] [id number]  Patient Care Team: Claretta Fraise, MD as PCP - General (Family Medicine) [name] [name], MD as Consulting Physician (Gastroenterology)  This patient is a 61 y.o.male   Reason for call: Abdominal pain and abnormal CT scan.  Called by Dr. [name] at [facility] emergency department.  Concern of 61 year old male with persistent crampy abdominal pain.  Immunosuppressed for rheumatoid arthritis.  CAT scan showed some possible thickening of bowel and colon concern for colitis or ileitis.  Abdominal discomfort moderate to severe.  I reviewed the chart and CAT scan discussed with Dr. [name].  here is no evidence of Crohn's, diverticulitis, appendicitis, perforated ulcer, bowel obstruction, perforation, incarcerated hernia, pneumatosis, or other major surgical concerns at this time.  Patient is not in septic shock or toxic.  While abdominal pain concerning, there is no evidence of any ischemia perforation or necrosis.  He is not a smoker or a vasculopath concerning for an ischemic etiology.  It sounds more like an infectious etiology that would benefit from IV fluids hydration and.  Recommend consideration of medical evaluation and consultation.  Most likely would benefit from admission IV fluids, IV antibiotics, and bowel rest.  Should the patient not improve or worsen, can revisit with a repeat CAT scan and/or formal surgical consultation to rule out something further.  Hopefully with the above recommendations, he will improve.   Patient Active Problem List   Diagnosis Date Noted  . Immunosuppressed status (Ronald Cameron) 09/02/2017    Priority: High  . Morbid obesity with body mass index of 40.0-49.9 (Ronald Cameron) 09/02/2017  . Aortic atherosclerosis (Ronald Cameron) 09/01/2017  . Acute bilateral lower abdominal pain 08/31/2017  . Diarrhea 11/17/2016  . Abnormal hepatitis serology 11/17/2016  . Renal lesion 05/06/2016  . History of colonic polyps   . Diverticulosis of colon  without hemorrhage   . Bloating 04/08/2016  . Encounter for screening colonoscopy 04/08/2016  . Colitis 04/06/2015  . Fatty liver 04/06/2015  . Early satiety 02/20/2015  . Abdominal pain, epigastric 02/20/2015  . Abdominal swelling, generalized 02/20/2015  . High risk medication use 02/20/2015  . Septic olecranon bursitis of right elbow 12/16/2013  . Pain in elbow 12/16/2013  . EXTERNAL HEMORRHOIDS 12/18/2008  . GERD 12/18/2008  . NEPHROLITHIASIS, HX OF 12/18/2008  . Rheumatoid arthritis (Ronald Cameron) 12/17/2008  . Hemorrhoids 12/15/2008  . RECTAL BLEEDING 12/15/2008  . RECTAL PAIN 12/15/2008  . Lower abdominal pain 12/15/2008    Past Medical History:  Diagnosis Date  . Collagen vascular disease (Ronald Cameron)    Rheumatoid Arthritis  . Rheumatoid arthritis [facility])     Past Surgical History:  Procedure Laterality Date  . COLONOSCOPY N/A 04/13/2016   Dr. [name]: 14 mm polyp in the ascending colon removed, diverticulosis in the distal descending colon and sigmoid colon, nonbleeding internal hemorrhoids. Pathology-tubular adenoma. Next colonoscopy 5 years.  . ELBOW SURGERY     X 3 last year. Staph infection but not MRSA per patient. infectious disease. out of work 6 months.   . TONSILLECTOMY      Social History   Socioeconomic History  . Marital status: Married    Spouse name: Not on file  . Number of children: 0  . Years of education: Not on file  . Highest education level: Not on file  Social Needs  . Financial resource strain: Not on file  . Food insecurity - worry: Not on file  . Food insecurity - inability: Not on file  . Transportation needs - medical: Not on  file  . Transportation needs - non-medical: Not on file  Occupational History  . Occupation: Horticulturist, commercial  Tobacco Use  . Smoking status: Former Smoker    Packs/day: 1.00    Years: 39.00    Pack years: 39.00    Types: Cigarettes    Start date: 07/11/1975    Last attempt to quit: 04/26/2014    Years since quitting: 3.3  .  Smokeless tobacco: Never Used  . Tobacco comment: Quit x 3 years  Substance and Sexual Activity  . Alcohol use: No    Alcohol/week: 0.0 oz  . Drug use: No  . Sexual activity: Not on file  Other Topics Concern  . Not on file  Social History Narrative  . Not on file    Family History  Problem Relation Age of Onset  . Heart attack Father   . Diabetes Mother   . Colon cancer Neg Hx   . Liver disease Neg Hx     Current Facility-Administered Medications  Medication Dose Route Frequency Provider Last Rate Last Dose  . 0.9 %  sodium chloride infusion   Intravenous Continuous Donne Hazel, MD 125 mL/hr at 09/02/17 1018    . acetaminophen (TYLENOL) tablet 650 mg  650 mg Oral Q6H PRN Donne Hazel, MD       Or  . acetaminophen (TYLENOL) suppository 650 mg  650 mg Rectal Q6H PRN Donne Hazel, MD      . ciprofloxacin (CIPRO) IVPB 400 mg  400 mg Intravenous Q12H Gadhia, Jigna M, RPH      . enoxaparin (LOVENOX) injection 50 mg  50 mg Subcutaneous Q24H Donne Hazel, MD   50 mg at 09/02/17 1021  . HYDROmorphone (DILAUDID) injection 0.5 mg  0.5 mg Intravenous Q4H PRN Donne Hazel, MD      . iopamidol (ISOVUE-370) 76 % injection           . metroNIDAZOLE (FLAGYL) IVPB 500 mg  500 mg Intravenous Q8H Donne Hazel, MD      . ondansetron [facility]) tablet 4 mg  4 mg Oral Q6H PRN Donne Hazel, MD       Or  . ondansetron [facility]) injection 4 mg  4 mg Intravenous Q6H PRN Donne Hazel, MD   4 mg at 09/02/17 1112  . predniSONE (DELTASONE) tablet 7.5 mg  7.5 mg Oral Daily Donne Hazel, MD      . sodium chloride 0.9 % bolus 1,000 mL  1,000 mL Intravenous Once Deno Etienne, DO   Stopped at 09/02/17 0700   Current Outpatient Medications  Medication Sig Dispense Refill  . Ascorbic Acid (VITAMIN C) 1000 MG tablet Take 1,000 mg by mouth daily.    Marland Kitchen aspirin 81 MG tablet Take 81 mg by mouth daily.    . Calcium Carb-Cholecalciferol (CALCIUM-VITAMIN D) 600-400 MG-UNIT TABS Take 1 tablet by  mouth daily.    . folic acid (FOLVITE) 1 MG tablet Take 1 mg by mouth daily.    . methotrexate (RHEUMATREX) 2.5 MG tablet Take 25 mg by mouth every Sunday. Pt takes 10 tablets weekly. Caution:Chemotherapy. Protect from light.    . polyethylene glycol powder (GLYCOLAX/MIRALAX) powder Use as directed (Patient taking differently: Take 17-34 g by mouth daily as needed for mild constipation. Use as directed) 255 g 0  . predniSONE (DELTASONE) 5 MG tablet Take 7.5 mg by mouth daily.     . Simethicone (GAS-X EXTRA STRENGTH) 125 MG CAPS Take 125-375 mg by  mouth daily as needed (gas).    . tocilizumab (ACTEMRA) 400 MG/20ML SOLN injection Inject 400 mg into the vein every 30 (thirty) days.    . traMADol (ULTRAM) 50 MG tablet Take 1 tablet (50 mg total) by mouth every 6 (six) hours as needed. (Patient taking differently: Take 50-100 mg by mouth every 6 (six) hours as needed for moderate pain. ) 20 tablet 0  . traZODone (DESYREL) 100 MG tablet Take 100 mg by mouth at bedtime as needed for sleep.    Marland Kitchen dicyclomine (BENTYL) 10 MG capsule Take 1 capsule (10 mg total) by mouth 4 (four) times daily -  before meals and at bedtime. Hold for constipation 120 capsule 1     No Known Allergies  BP 135/79   Pulse 75   Temp 97.9 F (36.6 C) (Oral)   Resp 15   SpO2 92%   Ct Abdomen Pelvis W Contrast  Result Date: 08/31/2017 CLINICAL DATA:  Acute lower abdominal pain. EXAM: CT ABDOMEN AND PELVIS WITH CONTRAST TECHNIQUE: Multidetector CT imaging of the abdomen and pelvis was performed using the standard protocol following bolus administration of intravenous contrast. CONTRAST:  141mL ISOVUE-300 IOPAMIDOL (ISOVUE-300) INJECTION 61% COMPARISON:  CT scan of March 02, 2015. FINDINGS: Lower chest: No acute abnormality. Hepatobiliary: No gallstones are noted. Focal fatty infiltration of the liver is noted. Pancreas: Unremarkable. No pancreatic ductal dilatation or surrounding inflammatory changes. Spleen: Normal in size without  focal abnormality. Adrenals/Urinary Tract: Adrenal glands are unremarkable. Small nonobstructive left renal calculi are noted. No hydronephrosis or renal obstruction is noted. Bladder is unremarkable. Stomach/Bowel: Stomach is within normal limits. Appendix appears normal. No evidence of bowel wall thickening, distention, or inflammatory changes. Sigmoid diverticulosis is noted without inflammation. Vascular/Lymphatic: Aortic atherosclerosis. No enlarged abdominal or pelvic lymph nodes. Reproductive: Prostate is unremarkable. Other: No abdominal wall hernia or abnormality. No abdominopelvic ascites. Musculoskeletal: No acute or significant osseous findings. IMPRESSION: Fatty infiltration of the liver. Sigmoid diverticulosis without inflammation. Aortic atherosclerosis. Nonobstructive left nephrolithiasis. No other abnormality seen in the abdomen or pelvis. Electronically Signed   By: Marijo Conception, M.D.   On: 08/31/2017 14:41   Ct Angio Abd/pel W And/or Wo Contrast  Result Date: 09/02/2017 CLINICAL DATA:  Abdominal pain.  Evaluate for mesenteric ischemia. EXAM: CTA ABDOMEN AND PELVIS wITHOUT AND WITH CONTRAST TECHNIQUE: Multidetector CT imaging of the abdomen and pelvis was performed using the standard protocol during with Bolus administration of intravenous contrast. Multiplanar reconstructed images and MIPs were obtained and reviewed to evaluate the vascular anatomy. CONTRAST:  125mL ISOVUE-370 IOPAMIDOL (ISOVUE-370) INJECTION 76% COMPARISON:  CT 08/31/2017 FINDINGS: VASCULAR Aorta: Aorta normal caliber.  Minimal intimal calcification. Celiac: Widely patent. No ostial calcification. Typical with 3 branch anatomy. SMA: Widely patent with no ostial calcification. The branches SMA are patent with without evidence of occlusion for thrombus with pre with with Renals: Hip with go with bilateral patent single renal artery loose IMA: Patent widely patent with. Inflow: With mild intimal calcification no aneurysm with  Proximal Outflow: Normal Veins: SMV appendix feeding branches are normal. Portal vein normal) with in Review of the MIP images confirms the above findings. NON-VASCULAR Lower chest: A thickening mild linear atelectasis. Mild interlobular septal thickening Hepatobiliary: Stroke. Normal liver. Normal gallbladder. No duct dilatation Pancreas: No inflammation normal duct Spleen: Normal spleen Adrenals/Urinary Tract: Adrenal glands and kidneys are normal. Ureters and bladder normal Stomach/Bowel: Stomach, duodenum normal. Bowel is normal caliber. No mucosal enhancement bowel wall thickening. No pneumatosis. There is  some fluid within the mesentery of the RIGHT lower quadrant adjacent the small bowel and sigmoid colon (image 153, series 7). There is mild bowel wall thickening of a loop of small bowel through this region (image 135, series 7). Cecum is normal. Appendix not identified. Ascending, transverse and descending colon normal. Several diverticula through the sigmoid region. No acute inflammation. Rectum normal Lymphatic: No lymphadenopathy Reproductive: Normal prostate Other: No free-fluid Musculoskeletal: No aggressive osseous lesion IMPRESSION: VASCULAR 1. Celiac trunk, SMA, and IMA are widely patent. No ostial calcification. 2. SMV patent 3. Mild intimal calcification of the aorta and iliac arteries NON-VASCULAR 1. Small amount of fluid in the mesentery of RIGHT lower quadrant adjacent to the sigmoid colon and distal small bowel. Mild bowel wall edema of a loop of small bowel this region. Nonspecific finding which could represent enteritis or colitis. Favor small bowel inflammation with differential including infectious enteritis or ischemic enteritis. No evidence bowel obstruction. 2. No free air or pneumatosis.  No portal venous gas. Electronically Signed   By: Suzy Bouchard M.D.   On: 09/02/2017 07:51    Note: This dictation was prepared with Dragon/digital dictation along with [employer].  Any transcriptional errors that result from this process are unintentional.   .Adin Hector, M.D., F.A.C.S. Gastrointestinal and Minimally Invasive Surgery [facility] [address], [place], [place] [address] [phone] Main / Paging  09/02/2017 12:44 PM

## 2017-09-02 NOTE — Progress Notes (Signed)
Pharmacy Antibiotic Note  Ronald Cameron is a 61 y.o. male admitted on 09/02/2017 with intra-abdominal infection.  Pharmacy has been consulted for Ciprofloxacin dosing. Patient also placed on Metronidazole per MD.   Plan: Ciprofloxacin 400mg  IV q12h. Do not anticipate dose adjustment, so pharmacy will sign off. Please re-consult if can be of further assistance.     Temp (24hrs), Avg:97.4 F (36.3 C), Min:96.9 F (36.1 C), Max:97.9 F (36.6 C)  Recent Labs  Lab 08/31/17 1043 09/02/17 0546 09/02/17 0559  WBC 5.9 16.7*  --   CREATININE 1.05 1.00 0.90  LATICACIDVEN  --   --  1.60    Estimated Creatinine Clearance: 108.4 mL/min (by C-G formula based on SCr of 0.9 mg/dL).    No Known Allergies  Antimicrobials this admission: 12/8 >> Ciprofloxacin >> 12/8 >> Metronidazole >>  Microbiology results: None  Thank you for allowing pharmacy to be a part of this patient's care.   Lindell Spar, PharmD, BCPS Pager: [phone] 09/02/2017 9:30 AM

## 2017-09-03 ENCOUNTER — Inpatient Hospital Stay (HOSPITAL_COMMUNITY): Payer: BLUE CROSS/BLUE SHIELD

## 2017-09-03 DIAGNOSIS — K529 Noninfective gastroenteritis and colitis, unspecified: Secondary | ICD-10-CM

## 2017-09-03 DIAGNOSIS — R1084 Generalized abdominal pain: Secondary | ICD-10-CM

## 2017-09-03 LAB — COMPREHENSIVE METABOLIC PANEL
ALT: 33 U/L (ref 17–63)
ANION GAP: 8 (ref 5–15)
AST: 24 U/L (ref 15–41)
Albumin: 3.6 g/dL (ref 3.5–5.0)
Alkaline Phosphatase: 39 U/L (ref 38–126)
BUN: 19 mg/dL (ref 6–20)
CHLORIDE: 106 mmol/L (ref 101–111)
CO2: 23 mmol/L (ref 22–32)
Calcium: 8.4 mg/dL — ABNORMAL LOW (ref 8.9–10.3)
Creatinine, Ser: 0.99 mg/dL (ref 0.61–1.24)
GFR calc non Af Amer: >60 mL/min (ref >60)
Glucose, Bld: 128 mg/dL — ABNORMAL HIGH (ref 65–99)
POTASSIUM: 3.7 mmol/L (ref 3.5–5.1)
SODIUM: 137 mmol/L (ref 135–145)
Total Bilirubin: 1 mg/dL (ref 0.3–1.2)
Total Protein: 6.5 g/dL (ref 6.5–8.1)

## 2017-09-03 LAB — CBC
HEMATOCRIT: 44.6 % (ref 39.0–52.0)
HEMOGLOBIN: 15.2 g/dL (ref 13.0–17.0)
MCH: 33.9 pg (ref 26.0–34.0)
MCHC: 34.1 g/dL (ref 30.0–36.0)
MCV: 99.3 fL (ref 78.0–100.0)
Platelets: 294 10*3/uL (ref 150–400)
RBC: 4.49 MIL/uL (ref 4.22–5.81)
RDW: 15.7 % — ABNORMAL HIGH (ref 11.5–15.5)
WBC: 14.8 10*3/uL — ABNORMAL HIGH (ref 4.0–10.5)

## 2017-09-03 LAB — HIV ANTIBODY (ROUTINE TESTING W REFLEX): HIV SCREEN 4TH GENERATION: NONREACTIVE

## 2017-09-03 MED ORDER — SENNOSIDES-DOCUSATE SODIUM 8.6-50 MG PO TABS
2.0000 | ORAL_TABLET | Freq: Two times a day (BID) | ORAL | Status: DC
  Administered 2017-09-03 – 2017-09-04 (×3): 2 via ORAL
  Filled 2017-09-03 (×3): qty 2

## 2017-09-03 MED ORDER — PANTOPRAZOLE SODIUM 40 MG IV SOLR
40.0000 mg | INTRAVENOUS | Status: DC
  Administered 2017-09-03 – 2017-09-07 (×5): 40 mg via INTRAVENOUS
  Filled 2017-09-03 (×5): qty 40

## 2017-09-03 MED ORDER — POTASSIUM CHLORIDE IN NACL 40-0.9 MEQ/L-% IV SOLN
INTRAVENOUS | Status: DC
  Administered 2017-09-03 – 2017-09-04 (×3): 100 mL/h via INTRAVENOUS
  Filled 2017-09-03 (×4): qty 1000

## 2017-09-03 MED ORDER — POLYETHYLENE GLYCOL 3350 17 G PO PACK
17.0000 g | PACK | Freq: Two times a day (BID) | ORAL | Status: DC
  Administered 2017-09-03 – 2017-09-04 (×3): 17 g via ORAL
  Filled 2017-09-03 (×3): qty 1

## 2017-09-03 NOTE — Progress Notes (Signed)
PROGRESS NOTE    Ronald Cameron  GGE:[id number] DOB: January 20, 1956 DOA: 09/02/2017 PCP: Claretta Fraise, MD   Brief Narrative: 61 y.o. male with medical history significant of rheumatoid arthritis on chronic immunosuppressive therapy, morbid obesity who presented to the emergency department with complaints of worsening abdominal distention with abdominal pain and nausea.  In the ER CT scan of abdomen pelvis suggest enteritis versus colitis with no evidence of bowel obstruction.  Patient with a leukocytosis, general surgery was consulted and managed with antibiotics and IV fluid.  Assessment & Plan:   #Nausea vomiting and abdominal discomfort likely due to colitis/enteritis with presumed sepsis on admission: -Continue Flagyl, ciprofloxacin, IV fluid and supportive care.  Already evaluated by general surgery recommended medical management.  I will start clear liquid diet. -Add Protonix IV. -Patient's GI was contacted on admission, recommended outpatient follow-up.  #History of rheumatoid arthritis: Continue prednisone.  Recommend outpatient follow-up.  #Morbid obesity: Stable and recommended outpatient follow-up with healthy diet.  # Leukocytosis likely in the setting of colitis enteritis, monitor labs and continue antibiotics.  #Constipation: Patient reported no bowel movement for more than 4 days.  Ordered enema, MiraLAX and Colace senna.  Abdomen exam benign.   DVT prophylaxis: Lovenox subcutaneous Code Status: Full code Family Communication: No family at bedside Disposition Plan: Currently admitted    Consultants:   None  Procedures: None Antimicrobials: Cipro and Flagyl  Subjective: Patient was seen and examined at bedside.  Still, no nausea vomiting and not able to take orally but want to try to clear liquid.  No abdominal pain.  No bowel movement for last 3 4 days.  Objective: Vitals:   09/02/17 1200 09/02/17 1246 09/02/17 1942 09/03/17 0451  BP: 135/79 (!) 152/74  (!) 148/69 137/80  Pulse: 75 66 68 74  Resp: 15 16 16 16   Temp:  98.3 F (36.8 C) 97.9 F (36.6 C) 98.9 F (37.2 C)  TempSrc:  Oral Oral Oral  SpO2: 92% 98% 94% 96%  Weight:  101.8 kg (224 lb 6.9 oz)    Height:  5\' 11"  (1.803 m)      Intake/Output Summary (Last 24 hours) at 09/03/2017 1158 Last data filed at 09/03/2017 0905 Gross per 24 hour  Intake 687.5 ml  Output 700 ml  Net -12.5 ml   Filed Weights   09/02/17 1246  Weight: 101.8 kg (224 lb 6.9 oz)    Examination:  General exam: Appears calm and comfortable  Respiratory system: Clear to auscultation. Respiratory effort normal. No wheezing or crackle Cardiovascular system: S1 & S2 heard, RRR.  No pedal edema. Gastrointestinal system: Abdomen is distended likely chronic, soft, nontender.  Bowel sounds positive. Central nervous system: Alert and oriented. No focal neurological deficits. Extremities: Symmetric 5 x 5 power. Skin: No rashes, lesions or ulcers Psychiatry: Judgement and insight appear normal. Mood & affect appropriate.     Data Reviewed: I have personally reviewed following labs and imaging studies  CBC: Recent Labs  Lab 08/31/17 1043 09/02/17 0546 09/02/17 0559 09/03/17 0402  WBC 5.9 16.7*  --  14.8*  NEUTROABS  --  14.5*  --   --   HGB 16.3 16.2 17.7* 15.2  HCT 49.6 46.1 52.0 44.6  MCV 101.0* 98.3  --  99.3  PLT 328 325  --  465   Basic Metabolic Panel: Recent Labs  Lab 08/31/17 1043 09/02/17 0546 09/02/17 0559 09/03/17 0402  NA 138 136 141 137  K 3.6 3.6 3.7 3.7  CL 106 103  104 106  CO2 25 23  --  23  GLUCOSE 97 120* 120* 128*  BUN 22* 20 20 19   CREATININE 1.05 1.00 0.90 0.99  CALCIUM 9.3 9.3  --  8.4*   GFR: Estimated Creatinine Clearance: 97.6 mL/min (by C-G formula based on SCr of 0.99 mg/dL). Liver Function Tests: Recent Labs  Lab 08/31/17 1043 09/03/17 0402  AST 40 24  ALT 56 33  ALKPHOS 44 39  BILITOT 0.9 1.0  PROT 7.5 6.5  ALBUMIN 4.2 3.6   Recent Labs  Lab  08/31/17 1043 09/02/17 0546  LIPASE 36 31   No results for input(s): AMMONIA in the last 168 hours. Coagulation Profile: No results for input(s): INR, PROTIME in the last 168 hours. Cardiac Enzymes: No results for input(s): CKTOTAL, CKMB, CKMBINDEX, TROPONINI in the last 168 hours. BNP (last 3 results) No results for input(s): PROBNP in the last 8760 hours. HbA1C: No results for input(s): HGBA1C in the last 72 hours. CBG: No results for input(s): GLUCAP in the last 168 hours. Lipid Profile: No results for input(s): CHOL, HDL, LDLCALC, TRIG, CHOLHDL, LDLDIRECT in the last 72 hours. Thyroid Function Tests: No results for input(s): TSH, T4TOTAL, FREET4, T3FREE, THYROIDAB in the last 72 hours. Anemia Panel: No results for input(s): VITAMINB12, FOLATE, FERRITIN, TIBC, IRON, RETICCTPCT in the last 72 hours. Sepsis Labs: Recent Labs  Lab 09/02/17 0559 09/02/17 1326 09/02/17 1633  LATICACIDVEN 1.60 1.3 1.4    No results found for this or any previous visit (from the past 240 hour(s)).       Radiology Studies: Dg Abd Portable 1v  Result Date: 09/03/2017 CLINICAL DATA:  Followup ileus EXAM: PORTABLE ABDOMEN - 1 VIEW COMPARISON:  CT 09/02/2017 and 08/31/2017. FINDINGS: Dilated fluid and air-filled small intestine. Moderate amount of gas also in the colon. The findings could be due to partial obstruction of the distal small bowel or ileus. In reviewing the CT scans, I think the patient has worsening sigmoid diverticulitis. IMPRESSION: Abnormal gas pattern which could be due to partial small bowel obstruction or ileus. Review of the CT scans leads me to favor the diagnosis of worsening sigmoid diverticulitis, emanating from the sigmoid colon extending towards the right lower quadrant. Electronically Signed   By: Nelson Chimes M.D.   On: 09/03/2017 06:50   Ct Angio Abd/pel W And/or Wo Contrast  Result Date: 09/02/2017 CLINICAL DATA:  Abdominal pain.  Evaluate for mesenteric ischemia.  EXAM: CTA ABDOMEN AND PELVIS wITHOUT AND WITH CONTRAST TECHNIQUE: Multidetector CT imaging of the abdomen and pelvis was performed using the standard protocol during with Bolus administration of intravenous contrast. Multiplanar reconstructed images and MIPs were obtained and reviewed to evaluate the vascular anatomy. CONTRAST:  161mL ISOVUE-370 IOPAMIDOL (ISOVUE-370) INJECTION 76% COMPARISON:  CT 08/31/2017 FINDINGS: VASCULAR Aorta: Aorta normal caliber.  Minimal intimal calcification. Celiac: Widely patent. No ostial calcification. Typical with 3 branch anatomy. SMA: Widely patent with no ostial calcification. The branches SMA are patent with without evidence of occlusion for thrombus with pre with with Renals: Hip with go with bilateral patent single renal artery loose IMA: Patent widely patent with. Inflow: With mild intimal calcification no aneurysm with Proximal Outflow: Normal Veins: SMV appendix feeding branches are normal. Portal vein normal) with in Review of the MIP images confirms the above findings. NON-VASCULAR Lower chest: A thickening mild linear atelectasis. Mild interlobular septal thickening Hepatobiliary: Stroke. Normal liver. Normal gallbladder. No duct dilatation Pancreas: No inflammation normal duct Spleen: Normal spleen Adrenals/Urinary Tract: Adrenal  glands and kidneys are normal. Ureters and bladder normal Stomach/Bowel: Stomach, duodenum normal. Bowel is normal caliber. No mucosal enhancement bowel wall thickening. No pneumatosis. There is some fluid within the mesentery of the RIGHT lower quadrant adjacent the small bowel and sigmoid colon (image 153, series 7). There is mild bowel wall thickening of a loop of small bowel through this region (image 135, series 7). Cecum is normal. Appendix not identified. Ascending, transverse and descending colon normal. Several diverticula through the sigmoid region. No acute inflammation. Rectum normal Lymphatic: No lymphadenopathy Reproductive: Normal  prostate Other: No free-fluid Musculoskeletal: No aggressive osseous lesion IMPRESSION: VASCULAR 1. Celiac trunk, SMA, and IMA are widely patent. No ostial calcification. 2. SMV patent 3. Mild intimal calcification of the aorta and iliac arteries NON-VASCULAR 1. Small amount of fluid in the mesentery of RIGHT lower quadrant adjacent to the sigmoid colon and distal small bowel. Mild bowel wall edema of a loop of small bowel this region. Nonspecific finding which could represent enteritis or colitis. Favor small bowel inflammation with differential including infectious enteritis or ischemic enteritis. No evidence bowel obstruction. 2. No free air or pneumatosis.  No portal venous gas. Electronically Signed   By: Suzy Bouchard M.D.   On: 09/02/2017 07:51        Scheduled Meds: . enoxaparin (LOVENOX) injection  50 mg Subcutaneous Q24H  . polyethylene glycol  17 g Oral BID  . predniSONE  7.5 mg Oral Q breakfast  . senna-docusate  2 tablet Oral BID   Continuous Infusions: . 0.9 % NaCl with KCl 40 mEq / L    . ciprofloxacin Stopped (09/03/17 1132)  . metronidazole 500 mg (09/03/17 1032)  . sodium chloride Stopped (09/02/17 0700)     LOS: 1 day    Dron Tanna Furry, MD [facility] Pager [phone]  If 7PM-7AM, please contact night-coverage [email or web address] Password [email or web address] 09/03/2017, 11:58 AM

## 2017-09-04 ENCOUNTER — Inpatient Hospital Stay (HOSPITAL_COMMUNITY): Payer: BLUE CROSS/BLUE SHIELD

## 2017-09-04 ENCOUNTER — Telehealth: Payer: Self-pay

## 2017-09-04 LAB — CBC
HEMATOCRIT: 40.8 % (ref 39.0–52.0)
HEMOGLOBIN: 13.8 g/dL (ref 13.0–17.0)
MCH: 33.6 pg (ref 26.0–34.0)
MCHC: 33.8 g/dL (ref 30.0–36.0)
MCV: 99.3 fL (ref 78.0–100.0)
Platelets: 273 10*3/uL (ref 150–400)
RBC: 4.11 MIL/uL — ABNORMAL LOW (ref 4.22–5.81)
RDW: 15.5 % (ref 11.5–15.5)
WBC: 12.5 10*3/uL — AB (ref 4.0–10.5)

## 2017-09-04 MED ORDER — LIDOCAINE VISCOUS 2 % MT SOLN
15.0000 mL | Freq: Once | OROMUCOSAL | Status: AC
  Administered 2017-09-04: 15 mL via OROMUCOSAL
  Filled 2017-09-04: qty 15

## 2017-09-04 MED ORDER — LACTULOSE 10 GM/15ML PO SOLN: 30.0000 g | Freq: Three times a day (TID) | ORAL | Status: DC

## 2017-09-04 NOTE — Progress Notes (Signed)
PROGRESS NOTE    Ronald Cameron  KKX:[id number] DOB: [birth year] DOA: 09/02/2017 PCP: Claretta Fraise, MD   Brief Narrative: 61 y.o. male with medical history significant of rheumatoid arthritis on chronic immunosuppressive therapy, morbid obesity who presented to the emergency department with complaints of worsening abdominal distention with abdominal pain and nausea.  In the ER CT scan of abdomen pelvis suggest enteritis versus colitis with no evidence of bowel obstruction.  Patient with a leukocytosis, general surgery was consulted and managed with antibiotics and IV fluid.  Assessment & Plan:   #Nausea vomiting and abdominal discomfort likely due to colitis/enteritis with presumed sepsis on admission: -Continue Flagyl, ciprofloxacin, IV fluid and supportive care.  Already evaluated by general surgery recommended medical management. -On clear liquid diet, continue supportive care. -Because he is abdominal distention, I asked general surgery for evaluation.  #History of rheumatoid arthritis: Continue prednisone.  Recommend outpatient follow-up.  #Morbid obesity: Stable and recommended outpatient follow-up with healthy diet.  # Leukocytosis likely in the setting of colitis enteritis, monitor labs and continue antibiotics.  #Constipation: Continue stool softener.  May consider another enema lactulose after surgical evaluation.  He has abdominal distention however has bowel sounds present.   DVT prophylaxis: Lovenox subcutaneous Code Status: Full code Family Communication: No family at bedside Disposition Plan: Currently admitted    Consultants:   None  Procedures: None Antimicrobials: Cipro and Flagyl  Subjective: Patient was seen and examined at bedside.  Still no bowel movement.  Denied nausea vomiting.  Abdominal discomfort better. Objective: Vitals:   09/03/17 0451 09/03/17 1420 09/03/17 2200 09/04/17 0610  BP: 137/80 (!) 146/86 133/80 134/67  Pulse: 74 69 77 69    Resp: 16 18 18 18   Temp: 98.9 F (37.2 C) 98.3 F (36.8 C) 98.5 F (36.9 C) 98.3 F (36.8 C)  TempSrc: Oral Oral Oral Oral  SpO2: 96% 97% 94% 95%  Weight:      Height:        Intake/Output Summary (Last 24 hours) at 09/04/2017 1233 Last data filed at 09/04/2017 1146 Gross per 24 hour  Intake 1001.67 ml  Output 500 ml  Net 501.67 ml   Filed Weights   09/02/17 1246  Weight: 101.8 kg (224 lb 6.9 oz)    Examination:  General exam: Not in distress Respiratory system: Clear bilateral, respiratory for normal Cardiovascular system: Regular rate rhythm S1-S2 normal.  No pedal edema Gastrointestinal system: Abdomen is distended, firm.  Bowel sounds positive.  Central nervous system: Alert and oriented. No focal neurological deficits. Skin: No rashes, lesions or ulcers Psychiatry: Judgement and insight appear normal. Mood & affect appropriate.     Data Reviewed: I have personally reviewed following labs and imaging studies  CBC: Recent Labs  Lab 08/31/17 1043 09/02/17 0546 09/02/17 0559 09/03/17 0402 09/04/17 0346  WBC 5.9 16.7*  --  14.8* 12.5*  NEUTROABS  --  14.5*  --   --   --   HGB 16.3 16.2 17.7* 15.2 13.8  HCT 49.6 46.1 52.0 44.6 40.8  MCV 101.0* 98.3  --  99.3 99.3  PLT 328 325  --  294 169   Basic Metabolic Panel: Recent Labs  Lab 08/31/17 1043 09/02/17 0546 09/02/17 0559 09/03/17 0402  NA 138 136 141 137  K 3.6 3.6 3.7 3.7  CL 106 103 104 106  CO2 25 23  --  23  GLUCOSE 97 120* 120* 128*  BUN 22* 20 20 19   CREATININE 1.05 1.00 0.90 0.99  CALCIUM 9.3 9.3  --  8.4*   GFR: Estimated Creatinine Clearance: 97.6 mL/min (by C-G formula based on SCr of 0.99 mg/dL). Liver Function Tests: Recent Labs  Lab 08/31/17 1043 09/03/17 0402  AST 40 24  ALT 56 33  ALKPHOS 44 39  BILITOT 0.9 1.0  PROT 7.5 6.5  ALBUMIN 4.2 3.6   Recent Labs  Lab 08/31/17 1043 09/02/17 0546  LIPASE 36 31   No results for input(s): AMMONIA in the last 168  hours. Coagulation Profile: No results for input(s): INR, PROTIME in the last 168 hours. Cardiac Enzymes: No results for input(s): CKTOTAL, CKMB, CKMBINDEX, TROPONINI in the last 168 hours. BNP (last 3 results) No results for input(s): PROBNP in the last 8760 hours. HbA1C: No results for input(s): HGBA1C in the last 72 hours. CBG: No results for input(s): GLUCAP in the last 168 hours. Lipid Profile: No results for input(s): CHOL, HDL, LDLCALC, TRIG, CHOLHDL, LDLDIRECT in the last 72 hours. Thyroid Function Tests: No results for input(s): TSH, T4TOTAL, FREET4, T3FREE, THYROIDAB in the last 72 hours. Anemia Panel: No results for input(s): VITAMINB12, FOLATE, FERRITIN, TIBC, IRON, RETICCTPCT in the last 72 hours. Sepsis Labs: Recent Labs  Lab 09/02/17 0559 09/02/17 1326 09/02/17 1633  LATICACIDVEN 1.60 1.3 1.4    No results found for this or any previous visit (from the past 240 hour(s)).       Radiology Studies: Dg Abd Portable 1v  Result Date: 09/03/2017 CLINICAL DATA:  Followup ileus EXAM: PORTABLE ABDOMEN - 1 VIEW COMPARISON:  CT 09/02/2017 and 08/31/2017. FINDINGS: Dilated fluid and air-filled small intestine. Moderate amount of gas also in the colon. The findings could be due to partial obstruction of the distal small bowel or ileus. In reviewing the CT scans, I think the patient has worsening sigmoid diverticulitis. IMPRESSION: Abnormal gas pattern which could be due to partial small bowel obstruction or ileus. Review of the CT scans leads me to favor the diagnosis of worsening sigmoid diverticulitis, emanating from the sigmoid colon extending towards the right lower quadrant. Electronically Signed   By: Nelson Chimes M.D.   On: 09/03/2017 06:50        Scheduled Meds: . enoxaparin (LOVENOX) injection  50 mg Subcutaneous Q24H  . lactulose  30 g Oral TID  . pantoprazole (PROTONIX) IV  40 mg Intravenous Q24H  . polyethylene glycol  17 g Oral BID  . predniSONE  7.5 mg  Oral Q breakfast  . senna-docusate  2 tablet Oral BID   Continuous Infusions: . 0.9 % NaCl with KCl 40 mEq / L 100 mL/hr (09/04/17 0341)  . ciprofloxacin Stopped (09/04/17 1155)  . metronidazole 500 mg (09/04/17 1055)  . sodium chloride Stopped (09/02/17 0700)     LOS: 2 days    Kiira Brach Tanna Furry, MD [facility] Pager [phone]  If 7PM-7AM, please contact night-coverage [email or web address] Password [facility] 09/04/2017, 12:33 PM

## 2017-09-04 NOTE — Progress Notes (Signed)
CC:  Ronald Cameron, Ronald feels really bloated  Subjective: Patient is a 61 year old male seen in the ED by Dr. gross on 09/02/17.  CT scan showed some thickening of the bowel and colon concerning for colitis/ileitis.  Moderate Ronald discomfort at that time.  There is no evidence of Crohn's disease, diverticulitis, appendicitis perforated ulcer or bowel obstruction, perforation, incarcerated hernia or pneumatosis.  Patient was nontoxic on exam at that time.  Was his opinion decided more infectious and would benefit from IV fluids and hydration. Patient has been afebrile and his vital signs are stable.  On Admission his WBC was 5.9, it is now up to 12.5 at this a.m.  CMP & lactate yesterday were essentially normal.  CT scan 08/31/17 shows fatty liver infiltration, sigmoid diverticulosis without active inflammation nonobstructive left hydronephrosis.  CT angiogram on 09/02/17 shows the celiac trunk, SMA and IMA are widely patent with no ostial calcifications.  SMV is patent, mild intimal calcification of the aorta and iliac arteries.  Small amount of fluid in the right lower quadrant adjacent to the sigmoid colon and distal small bowel.  Mild bowel  wall edema small home bowel.  Differential includes enteritis versus colitis.  No free air or pneumatosis no portal gas.  Single view Ronald film yesterday shows some dilated fluid and air-filled small bowel.  Also a moderate amount of gas in the colon.  Concern over partial obstruction or ileus.  We are asked to see. Repeat film this afternoon shows increasing SBO.    Objective: Vital signs in last 24 hours: Temp:  [98.3 F (36.8 C)-98.5 F (36.9 C)] 98.3 F (36.8 C) (12/10 0610) Pulse Rate:  [69-77] 69 (12/10 0610) Resp:  [18] 18 (12/10 0610) BP: (133-146)/(67-86) 134/67 (12/10 0610) SpO2:  [94 %-97 %] 95 % (12/10 0610) Last BM Date: 09/03/17(small amount after enema) NPO 881 IV 500 urine recorded Afebrile, vital signs are stable. WBC 12.5  hemoglobin 13.8, hematocrit 40.8.,  Platelets 273,000. HIV screen is nonreactive     Intake/Output from previous day: 12/09 0701 - 12/10 0700 In: 881.7 [I.V.:381.7; IV Piggyback:500] Out: 500 [Urine:500] Intake/Output this shift: Total I/O In: 120 [P.O.:120] Out: 200 [Urine:200]  General appearance: alert, cooperative and no distress Resp: clear to auscultation bilaterally and anterior exam GI: very distended, and uncomfortable.  No nausea, or vomiting  Lab Results:  Recent Labs    09/03/17 0402 09/04/17 0346  WBC 14.8* 12.5*  HGB 15.2 13.8  HCT 44.6 40.8  PLT 294 273    BMET Recent Labs    09/02/17 0546 09/02/17 0559 09/03/17 0402  NA 136 141 137  K 3.6 3.7 3.7  CL 103 104 106  CO2 23  --  23  GLUCOSE 120* 120* 128*  BUN 20 20 19   CREATININE 1.00 0.90 0.99  CALCIUM 9.3  --  8.4*   PT/INR No results for input(s): LABPROT, INR in the last 72 hours.  Recent Labs  Lab 08/31/17 1043 09/03/17 0402  AST 40 24  ALT 56 33  ALKPHOS 44 39  BILITOT 0.9 1.0  PROT 7.5 6.5  ALBUMIN 4.2 3.6     Lipase     Component Value Date/Time   LIPASE 31 09/02/2017 0546     Medications: . enoxaparin (LOVENOX) injection  50 mg Subcutaneous Q24H  . pantoprazole (PROTONIX) IV  40 mg Intravenous Q24H  . polyethylene glycol  17 g Oral BID  . predniSONE  7.5 mg Oral Q breakfast  . senna-docusate  2 tablet Oral BID   . 0.9 % NaCl with KCl 40 mEq / L 100 mL/hr (09/04/17 0341)  . ciprofloxacin Stopped (09/04/17 1155)  . metronidazole Stopped (09/04/17 1155)  . sodium chloride Stopped (09/02/17 0700)   Anti-infectives (From admission, onward)   Start     Dose/Rate Route Frequency Ordered Stop   09/02/17 2000  ciprofloxacin (CIPRO) IVPB 400 mg     400 mg 200 mL/hr over 60 Minutes Intravenous Every 12 hours 09/02/17 0928     09/02/17 1600  metroNIDAZOLE (FLAGYL) IVPB 500 mg     500 mg 100 mL/hr over 60 Minutes Intravenous Every 8 hours 09/02/17 0914     09/02/17 0815   ciprofloxacin (CIPRO) IVPB 400 mg     400 mg 200 mL/hr over 60 Minutes Intravenous  Once 09/02/17 0811 09/02/17 0946   09/02/17 0815  metroNIDAZOLE (FLAGYL) IVPB 500 mg     500 mg 100 mL/hr over 60 Minutes Intravenous  Once 09/02/17 0093 09/02/17 0946      Assessment/Plan Ronald distension with Cameron lower abdomen ? colitis Elevated WBC  Hx of RA on Prednisone/Methotrexate Morbid obesity  Body mass index is 31  FEN:  IV fluids/NPO now except for ice ID:  Cipro/Flagyl 09/02/17 =>> day 3 DVT:  Lovenox Foley:  Cameron Follow up:  TBD   Plan:  Reviewed and examined by Dr.Martin.  Will insert NG, NG decompression,and place on bowel rest.  Repeat film in AM.      LOS: 2 days    Angline Schweigert 09/04/2017 [phone]

## 2017-09-04 NOTE — Progress Notes (Signed)
Abdomen Xray reviewed.  Will keep patient NPO Continue antibiotics Surgery consult was re-consulted this morning, follow up the plan.

## 2017-09-04 NOTE — Telephone Encounter (Signed)
Tried to call the pt about her appt tomorrow 09/05/17. The office will be closed d/t the weather. The pt did not answer the phone but I was able to leave a message. I have cancelled his appt. Please reschedule.

## 2017-09-04 NOTE — Progress Notes (Signed)
NG tube placed per MD order, x ray ordered to verify placement per protocol.

## 2017-09-05 ENCOUNTER — Inpatient Hospital Stay (HOSPITAL_COMMUNITY): Payer: BLUE CROSS/BLUE SHIELD

## 2017-09-05 ENCOUNTER — Ambulatory Visit: Payer: BLUE CROSS/BLUE SHIELD | Admitting: Gastroenterology

## 2017-09-05 LAB — CBC
HEMATOCRIT: 46.5 % (ref 39.0–52.0)
Hemoglobin: 16.1 g/dL (ref 13.0–17.0)
MCH: 34 pg (ref 26.0–34.0)
MCHC: 34.6 g/dL (ref 30.0–36.0)
MCV: 98.3 fL (ref 78.0–100.0)
Platelets: 304 10*3/uL (ref 150–400)
RBC: 4.73 MIL/uL (ref 4.22–5.81)
RDW: 15.4 % (ref 11.5–15.5)
WBC: 10.9 10*3/uL — AB (ref 4.0–10.5)

## 2017-09-05 LAB — BASIC METABOLIC PANEL
ANION GAP: 10 (ref 5–15)
BUN: 12 mg/dL (ref 6–20)
CALCIUM: 8.6 mg/dL — AB (ref 8.9–10.3)
CO2: 21 mmol/L — AB (ref 22–32)
CREATININE: 1.05 mg/dL (ref 0.61–1.24)
Chloride: 106 mmol/L (ref 101–111)
GFR calc Af Amer: >60 mL/min (ref >60)
GFR calc non Af Amer: >60 mL/min (ref >60)
GLUCOSE: 95 mg/dL (ref 65–99)
Potassium: 3.7 mmol/L (ref 3.5–5.1)
Sodium: 137 mmol/L (ref 135–145)

## 2017-09-05 MED ORDER — KCL IN DEXTROSE-NACL 20-5-0.9 MEQ/L-%-% IV SOLN
INTRAVENOUS | Status: DC
  Administered 2017-09-05 – 2017-09-08 (×3): via INTRAVENOUS
  Filled 2017-09-05 (×4): qty 1000

## 2017-09-05 MED ORDER — HYDROCORTISONE NA SUCCINATE PF 100 MG IJ SOLR
50.0000 mg | Freq: Once | INTRAMUSCULAR | Status: AC
Start: 2017-09-05 — End: 2017-09-05
  Administered 2017-09-05: 50 mg via INTRAVENOUS
  Filled 2017-09-05: qty 2

## 2017-09-05 NOTE — Progress Notes (Signed)
CC:  Abdominal pain  Subjective: He says he feels better and is having some BM, 1 recorded yesterday and 3 soft stools this Am per pt.  He is still very distended and Xray shows increased SB dilatation.  NG irrigated and sump fixed.  NG was advanced and is in right place this AM on film.  He is eating allot of ice.  Objective: Vital signs in last 24 hours: Temp:  [98.9 F (37.2 C)] 98.9 F (37.2 C) (12/10 1952) Pulse Rate:  [71] 71 (12/10 1952) Resp:  [16] 16 (12/10 1952) BP: (154)/(81) 154/81 (12/10 1952) SpO2:  [95 %] 95 % (12/10 1952) Last BM Date: 09/05/17 240 PO 2200 IV 1200 urine  NG 850 BM x 1 Afebrile, VSS K+ 3.7 WBC better at 10.9 DG abd this AM:  Partial small bowel obstruction, with mild increase in small bowel dilatation since prior study. CT was on 08/31/17 Nasogastric tube in appropriate position Intake/Output from previous day: 12/10 0701 - 12/11 0700 In: 2440 [P.O.:240; I.V.:2200] Out: 2051 [Urine:1200; Stool:1] Intake/Output this shift: Total I/O In: -  Out: 2 [Stool:2]  General appearance: alert, cooperative, no distress and still distended Resp: clear to auscultation bilaterally and anteriror exam GI: still very distended, no BS, NG working  Lab Results:  Recent Labs    09/04/17 0346 09/05/17 0914  WBC 12.5* 10.9*  HGB 13.8 16.1  HCT 40.8 46.5  PLT 273 304    BMET Recent Labs    09/03/17 0402 09/05/17 0914  NA 137 137  K 3.7 3.7  CL 106 106  CO2 23 21*  GLUCOSE 128* 95  BUN 19 12  CREATININE 0.99 1.05  CALCIUM 8.4* 8.6*   PT/INR No results for input(s): LABPROT, INR in the last 72 hours.  Recent Labs  Lab 08/31/17 1043 09/03/17 0402  AST 40 24  ALT 56 33  ALKPHOS 44 39  BILITOT 0.9 1.0  PROT 7.5 6.5  ALBUMIN 4.2 3.6     Lipase     Component Value Date/Time   LIPASE 31 09/02/2017 0546     Medications: . enoxaparin (LOVENOX) injection  50 mg Subcutaneous Q24H  . pantoprazole (PROTONIX) IV  40 mg  Intravenous Q24H  . predniSONE  7.5 mg Oral Q breakfast   . 0.9 % NaCl with KCl 40 mEq / L 100 mL/hr (09/05/17 0201)  . ciprofloxacin 400 mg (09/05/17 0951)  . metronidazole Stopped (09/05/17 8338)  . sodium chloride Stopped (09/02/17 0700)   Anti-infectives (From admission, onward)   Start     Dose/Rate Route Frequency Ordered Stop   09/02/17 2000  ciprofloxacin (CIPRO) IVPB 400 mg     400 mg 200 mL/hr over 60 Minutes Intravenous Every 12 hours 09/02/17 0928     09/02/17 1600  metroNIDAZOLE (FLAGYL) IVPB 500 mg     500 mg 100 mL/hr over 60 Minutes Intravenous Every 8 hours 09/02/17 0914     09/02/17 0815  ciprofloxacin (CIPRO) IVPB 400 mg     400 mg 200 mL/hr over 60 Minutes Intravenous  Once 09/02/17 0811 09/02/17 0946   09/02/17 0815  metroNIDAZOLE (FLAGYL) IVPB 500 mg     500 mg 100 mL/hr over 60 Minutes Intravenous  Once 09/02/17 0811 09/02/17 0946      Assessment/Plan Abdominal distension with pain lower abdomen/? SBO ? colitis Elevated WBC  Hx of RA on Prednisone/Methotrexate Morbid obesity  Body mass index is 31  FEN:  IV fluids/NPO now except for ice  ID:  Cipro/Flagyl 09/02/17 =>> day 4 DVT:  Lovenox Foley:  None Follow up:  TBD   Plan:  I would like to do the small bowel protocol but with increased SB distension with the NG in I am going to hold for now.  Continue suction and bowel rest.  Film and labs in AM.  Keep his K+ around 4.0.  I will review with Dr. Hassell Done.       LOS: 3 days    Ford Peddie 09/05/2017 [phone]

## 2017-09-05 NOTE — Progress Notes (Signed)
PROGRESS NOTE    Ronald Cameron  OEV:[id number] DOB: Oct 08, 1955 DOA: 09/02/2017 PCP: Claretta Fraise, MD   Brief Narrative: 61 y.o. male with medical history significant of rheumatoid arthritis on chronic immunosuppressive therapy, morbid obesity who presented to the emergency department with complaints of worsening abdominal distention with abdominal pain and nausea.  In the ER CT scan of abdomen pelvis suggest enteritis versus colitis with no evidence of bowel obstruction.  Patient with a leukocytosis, general surgery was consulted and managed with antibiotics and IV fluid.  Assessment & Plan:   #Nausea vomiting and abdominal pain due to small bowel obstruction and colitis/enteritis with presumed sepsis on admission: -Patient currently has NG tube with suction, IV fluid, IV Protonix and supportive care.  Change IV fluid with dextrose and electrolytes.  General surgery consult appreciated.  Continue IV Flagyl and ciprofloxacin.  Had 3 episode of a small bowel movement this morning.  #History of rheumatoid arthritis: Currently n.p.o. therefore order a dose of hydrocortisone.  Continue oral prednisone when able to take orally.  Continue supportive care.  #Morbid obesity: Stable and recommended outpatient follow-up with healthy diet.  # Leukocytosis likely in the setting of colitis enteritis, monitor labs and continue antibiotics.  DVT prophylaxis: Lovenox subcutaneous Code Status: Full code Family Communication: No family at bedside Disposition Plan: Currently admitted    Consultants:   None  Procedures: None Antimicrobials: Cipro and Flagyl  Subjective: Patient was seen and examined at bedside.  Had 3 bowel movement today.  Still having the nausea and abdominal distention.  Denies chest pain, shortness of breath. Objective: Vitals:   09/03/17 1420 09/03/17 2200 09/04/17 0610 09/04/17 1952  BP: (!) 146/86 133/80 134/67 (!) 154/81  Pulse: 69 77 69 71  Resp: 18 18 18 16     Temp: 98.3 F (36.8 C) 98.5 F (36.9 C) 98.3 F (36.8 C) 98.9 F (37.2 C)  TempSrc: Oral Oral Oral Oral  SpO2: 97% 94% 95% 95%  Weight:      Height:        Intake/Output Summary (Last 24 hours) at 09/05/2017 1441 Last data filed at 09/05/2017 1001 Gross per 24 hour  Intake 2320 ml  Output 1854 ml  Net 466 ml   Filed Weights   09/02/17 1246  Weight: 101.8 kg (224 lb 6.9 oz)    Examination:  General exam: Not in distress Respiratory system: Clear bilaterally, respiratory effort normal. Cardiovascular system: Regular rate rhythm S1-S2 normal. Gastrointestinal system: Abdomen firm, distended.  Bowel sounds sluggish. Central nervous system: alert awake and following commands.. Skin: No rashes, lesions or ulcers Psychiatry: Judgement and insight appear normal. Mood & affect appropriate.     Data Reviewed: I have personally reviewed following labs and imaging studies  CBC: Recent Labs  Lab 08/31/17 1043 09/02/17 0546 09/02/17 0559 09/03/17 0402 09/04/17 0346 09/05/17 0914  WBC 5.9 16.7*  --  14.8* 12.5* 10.9*  NEUTROABS  --  14.5*  --   --   --   --   HGB 16.3 16.2 17.7* 15.2 13.8 16.1  HCT 49.6 46.1 52.0 44.6 40.8 46.5  MCV 101.0* 98.3  --  99.3 99.3 98.3  PLT 328 325  --  294 273 829   Basic Metabolic Panel: Recent Labs  Lab 08/31/17 1043 09/02/17 0546 09/02/17 0559 09/03/17 0402 09/05/17 0914  NA 138 136 141 137 137  K 3.6 3.6 3.7 3.7 3.7  CL 106 103 104 106 106  CO2 25 23  --  23 21*  GLUCOSE 97 120* 120* 128* 95  BUN 22* 20 20 19 12   CREATININE 1.05 1.00 0.90 0.99 1.05  CALCIUM 9.3 9.3  --  8.4* 8.6*   GFR: Estimated Creatinine Clearance: 92 mL/min (by C-G formula based on SCr of 1.05 mg/dL). Liver Function Tests: Recent Labs  Lab 08/31/17 1043 09/03/17 0402  AST 40 24  ALT 56 33  ALKPHOS 44 39  BILITOT 0.9 1.0  PROT 7.5 6.5  ALBUMIN 4.2 3.6   Recent Labs  Lab 08/31/17 1043 09/02/17 0546  LIPASE 36 31   No results for input(s):  AMMONIA in the last 168 hours. Coagulation Profile: No results for input(s): INR, PROTIME in the last 168 hours. Cardiac Enzymes: No results for input(s): CKTOTAL, CKMB, CKMBINDEX, TROPONINI in the last 168 hours. BNP (last 3 results) No results for input(s): PROBNP in the last 8760 hours. HbA1C: No results for input(s): HGBA1C in the last 72 hours. CBG: No results for input(s): GLUCAP in the last 168 hours. Lipid Profile: No results for input(s): CHOL, HDL, LDLCALC, TRIG, CHOLHDL, LDLDIRECT in the last 72 hours. Thyroid Function Tests: No results for input(s): TSH, T4TOTAL, FREET4, T3FREE, THYROIDAB in the last 72 hours. Anemia Panel: No results for input(s): VITAMINB12, FOLATE, FERRITIN, TIBC, IRON, RETICCTPCT in the last 72 hours. Sepsis Labs: Recent Labs  Lab 09/02/17 0559 09/02/17 1326 09/02/17 1633  LATICACIDVEN 1.60 1.3 1.4    No results found for this or any previous visit (from the past 240 hour(s)).       Radiology Studies: Dg Abd 1 View  Result Date: 09/04/2017 CLINICAL DATA:  Check nasogastric catheter placement EXAM: ABDOMEN - 1 VIEW COMPARISON:  Film from earlier in the same day FINDINGS: Nasogastric catheter has been placed and lies within the stomach. The proximal side port is at the level of the gastroesophageal junction. This could be advanced as clinically needed. Persistent small bowel dilatation is noted. IMPRESSION: Nasogastric catheter as described. Electronically Signed   By: Inez Catalina M.D.   On: 09/04/2017 17:25   Dg Abd 1 View  Result Date: 09/04/2017 CLINICAL DATA:  Abdominal pain and distension EXAM: ABDOMEN - 1 VIEW COMPARISON:  09/03/2017 FINDINGS: There again noted air-filled loops of large and small bowel. The degree of small bowel dilatation has increased somewhat in the interval from the prior exam. No definitive free air is seen. No abnormal mass is noted. Degenerative changes of the lumbar spine are seen. IMPRESSION: Increasing small  bowel dilatation when compared with the prior exam. Electronically Signed   By: Inez Catalina M.D.   On: 09/04/2017 14:56   Dg Abd 2 Views  Result Date: 09/05/2017 CLINICAL DATA:  Small bowel obstruction. EXAM: ABDOMEN - 2 VIEW COMPARISON:  09/04/2017 FINDINGS: Nasogastric tube remains in place with tip in proximal stomach. Multiple moderately dilated small bowel loops containing air-fluid levels show mildly increased dilatation since previous study. Scattered bowel gas seen throughout colon, which is nondilated. No evidence of free intraperitoneal air. IMPRESSION: Partial small bowel obstruction, with mild increase in small bowel dilatation since prior study. Nasogastric tube in appropriate position. Electronically Signed   By: Earle Gell M.D.   On: 09/05/2017 09:18        Scheduled Meds: . enoxaparin (LOVENOX) injection  50 mg Subcutaneous Q24H  . pantoprazole (PROTONIX) IV  40 mg Intravenous Q24H  . predniSONE  7.5 mg Oral Q breakfast   Continuous Infusions: . 0.9 % NaCl with KCl 40 mEq / L 100 mL/hr (09/05/17  0201)  . ciprofloxacin 400 mg (09/05/17 0951)  . metronidazole Stopped (09/05/17 1287)  . sodium chloride Stopped (09/02/17 0700)     LOS: 3 days    Zakaiya Lares Tanna Furry, MD [facility] Pager [phone]  If 7PM-7AM, please contact night-coverage [email or web address] Password [email or web address] 09/05/2017, 2:41 PM

## 2017-09-06 ENCOUNTER — Inpatient Hospital Stay (HOSPITAL_COMMUNITY): Payer: BLUE CROSS/BLUE SHIELD

## 2017-09-06 ENCOUNTER — Encounter: Payer: Self-pay | Admitting: Internal Medicine

## 2017-09-06 DIAGNOSIS — M0579 Rheumatoid arthritis with rheumatoid factor of multiple sites without organ or systems involvement: Secondary | ICD-10-CM

## 2017-09-06 DIAGNOSIS — K529 Noninfective gastroenteritis and colitis, unspecified: Secondary | ICD-10-CM

## 2017-09-06 DIAGNOSIS — K56609 Unspecified intestinal obstruction, unspecified as to partial versus complete obstruction: Secondary | ICD-10-CM

## 2017-09-06 LAB — MAGNESIUM: MAGNESIUM: 1.8 mg/dL (ref 1.7–2.4)

## 2017-09-06 LAB — CBC
HEMATOCRIT: 45.3 % (ref 39.0–52.0)
Hemoglobin: 15.3 g/dL (ref 13.0–17.0)
MCH: 32.9 pg (ref 26.0–34.0)
MCHC: 33.8 g/dL (ref 30.0–36.0)
MCV: 97.4 fL (ref 78.0–100.0)
PLATELETS: 321 10*3/uL (ref 150–400)
RBC: 4.65 MIL/uL (ref 4.22–5.81)
RDW: 15.2 % (ref 11.5–15.5)
WBC: 12 10*3/uL — AB (ref 4.0–10.5)

## 2017-09-06 LAB — BASIC METABOLIC PANEL
Anion gap: 8 (ref 5–15)
BUN: 11 mg/dL (ref 6–20)
CALCIUM: 8.4 mg/dL — AB (ref 8.9–10.3)
CHLORIDE: 108 mmol/L (ref 101–111)
CO2: 21 mmol/L — ABNORMAL LOW (ref 22–32)
CREATININE: 0.94 mg/dL (ref 0.61–1.24)
Glucose, Bld: 100 mg/dL — ABNORMAL HIGH (ref 65–99)
Potassium: 3.7 mmol/L (ref 3.5–5.1)
SODIUM: 137 mmol/L (ref 135–145)

## 2017-09-06 MED ORDER — DIATRIZOATE MEGLUMINE & SODIUM 66-10 % PO SOLN
90.0000 mL | Freq: Once | ORAL | Status: AC
Start: 2017-09-06 — End: 2017-09-06
  Administered 2017-09-06: 90 mL via NASOGASTRIC
  Filled 2017-09-06 (×2): qty 90

## 2017-09-06 NOTE — Progress Notes (Signed)
I spoke with Ronald Cameron in Imaging to let her know that the Gastografin was administered via NG at 1344. Ngt clamped

## 2017-09-06 NOTE — Progress Notes (Signed)
Pt has has 2 "explosive BM"s. He described them as brown watery and formed. Large amount

## 2017-09-06 NOTE — Telephone Encounter (Signed)
PATIENT RESCHEDULED

## 2017-09-06 NOTE — Progress Notes (Signed)
Date: September 06, 2017 Velva Harman, BSN, [place], [place] Chart and notes review for patient progress and needs. Will follow for case management and discharge needs. Next review date: [id number]

## 2017-09-06 NOTE — Progress Notes (Signed)
CC:  Colitis vs SBO  Subjective: Doing better, much softer abdomen and having BM's  Objective: Vital signs in last 24 hours: Temp:  [98 F (36.7 C)-98.6 F (37 C)] 98.6 F (37 C) (12/12 0455) Pulse Rate:  [76-84] 76 (12/12 0455) Resp:  [16-18] 18 (12/12 0455) BP: (126-146)/(79-92) 134/89 (12/12 0455) SpO2:  [93 %-98 %] 93 % (12/12 0455) Last BM Date: 09/05/17 911 IV 452 urine recorded ? 750 NG Stool x 4 Afefbrile, VSS Labs OK WBC still up some 12K Film:  Nasogastric tube tip in proximal stomach with side port at gastroesophageal junction. Advise advancing nasogastric tube 6-8 cm to insure that side-port as well as tube tip are well within the stomach. Less bowel dilatation compared to 1 day prior with likely partial resolution of bowel obstruction. There do remain loops of dilated bowel with air-fluid levels. No evident free air.  Intake/Output from previous day: 12/11 0701 - 12/12 0700 In: 911.3 [I.V.:911.3] Out: 1206 [Urine:452; Stool:4] Intake/Output this shift: No intake/output data recorded.  General appearance: alert, cooperative and no distress Resp: clear to auscultation bilaterally GI: much softer abdomen, few BS, + BM  Lab Results:  Recent Labs    09/05/17 0914 09/06/17 0325  WBC 10.9* 12.0*  HGB 16.1 15.3  HCT 46.5 45.3  PLT 304 321    BMET Recent Labs    09/05/17 0914 09/06/17 0325  NA 137 137  K 3.7 3.7  CL 106 108  CO2 21* 21*  GLUCOSE 95 100*  BUN 12 11  CREATININE 1.05 0.94  CALCIUM 8.6* 8.4*   PT/INR No results for input(s): LABPROT, INR in the last 72 hours.  Recent Labs  Lab 08/31/17 1043 09/03/17 0402  AST 40 24  ALT 56 33  ALKPHOS 44 39  BILITOT 0.9 1.0  PROT 7.5 6.5  ALBUMIN 4.2 3.6     Lipase     Component Value Date/Time   LIPASE 31 09/02/2017 0546     Medications: . enoxaparin (LOVENOX) injection  50 mg Subcutaneous Q24H  . pantoprazole (PROTONIX) IV  40 mg Intravenous Q24H  . predniSONE  7.5  mg Oral Q breakfast   . ciprofloxacin 400 mg (09/06/17 0808)  . dextrose 5 % and 0.9 % NaCl with KCl 20 mEq/L 75 mL/hr at 09/06/17 0814  . metronidazole Stopped (09/06/17 0105)  . sodium chloride Stopped (09/02/17 0700)   Anti-infectives (From admission, onward)   Start     Dose/Rate Route Frequency Ordered Stop   09/02/17 2000  ciprofloxacin (CIPRO) IVPB 400 mg     400 mg 200 mL/hr over 60 Minutes Intravenous Every 12 hours 09/02/17 0928     09/02/17 1600  metroNIDAZOLE (FLAGYL) IVPB 500 mg     500 mg 100 mL/hr over 60 Minutes Intravenous Every 8 hours 09/02/17 0914     09/02/17 0815  ciprofloxacin (CIPRO) IVPB 400 mg     400 mg 200 mL/hr over 60 Minutes Intravenous  Once 09/02/17 0811 09/02/17 0946   09/02/17 0815  metroNIDAZOLE (FLAGYL) IVPB 500 mg     500 mg 100 mL/hr over 60 Minutes Intravenous  Once 09/02/17 0811 09/02/17 0946     Assessment/Plan Abdominal distension with pain lower abdomen/? SBO/ no hx of abdominal surgery ? colitis Elevated WBC  Hx of RA on Prednisone/Methotrexate Morbid obesityBody mass index is 31  FEN: IV fluids/NPO now except for ice ID: Cipro/Flagyl 09/02/17 =>>day 5 DVT: Lovenox Foley: None Follow up: TBD  Plan:  He  is much better, will do Small bowel protocol and see if we cannot get the NG out today or in AM.  Nurse will advance NG now.  Sips and chips today.        LOS: 4 days    Ronald Cameron 09/06/2017 [phone]

## 2017-09-06 NOTE — Progress Notes (Signed)
[facility] PROGRESS NOTE  MATTTHEW ZIOMEK OJJ:[id number] DOB: Mar 23, 1956 DOA: 09/02/2017  PCP: Claretta Fraise, MD  Brief History/Interval Summary: 61 year old male with a past medical history of rheumatoid arthritis, presented with complains of abdominal distention with pain and nausea.  He was found to have enteritis versus colitis.  Initial CT scan did not show any obstruction.  Subsequent films showed more small bowel distention.  Reason for Visit: Small bowel obstruction  Consultants: General surgery  Procedures: None  Antibiotics: Cipro and Flagyl  Subjective/Interval History: Patient states that he is feeling some better.  Abdominal pain has improved.  He has had some bowel movements.  ROS: Denies any shortness of breath  Objective:  Vital Signs  Vitals:   09/04/17 1952 09/05/17 1405 09/05/17 2017 09/06/17 0455  BP: (!) 154/81 126/79 (!) 146/92 134/89  Pulse: 71 84 83 76  Resp: 16 16 18 18   Temp: 98.9 F (37.2 C) 98 F (36.7 C) 98.3 F (36.8 C) 98.6 F (37 C)  TempSrc: Oral Oral Oral Oral  SpO2: 95% 96% 98% 93%  Weight:      Height:        Intake/Output Summary (Last 24 hours) at 09/06/2017 1524 Last data filed at 09/06/2017 0456 Gross per 24 hour  Intake 911.25 ml  Output 1200 ml  Net -288.75 ml   Filed Weights   09/02/17 1246  Weight: 101.8 kg (224 lb 6.9 oz)    General appearance: alert, cooperative, appears stated age and no distress Head: Normocephalic, without obvious abnormality, atraumatic Resp: clear to auscultation bilaterally Cardio: regular rate and rhythm, S1, S2 normal, no murmur, click, rub or gallop GI: Abdomen soft.  Mildly distended.  Bowel sounds sluggish.  No masses or organomegaly. Extremities: extremities normal, atraumatic, no cyanosis or edema Neurologic: No focal neurological deficits.  Lab Results:  Data Reviewed: I have personally reviewed following labs and imaging studies  CBC: Recent Labs  Lab  09/02/17 0546 09/02/17 0559 09/03/17 0402 09/04/17 0346 09/05/17 0914 09/06/17 0325  WBC 16.7*  --  14.8* 12.5* 10.9* 12.0*  NEUTROABS 14.5*  --   --   --   --   --   HGB 16.2 17.7* 15.2 13.8 16.1 15.3  HCT 46.1 52.0 44.6 40.8 46.5 45.3  MCV 98.3  --  99.3 99.3 98.3 97.4  PLT 325  --  294 273 304 937    Basic Metabolic Panel: Recent Labs  Lab 08/31/17 1043 09/02/17 0546 09/02/17 0559 09/03/17 0402 09/05/17 0914 09/06/17 0325  NA 138 136 141 137 137 137  K 3.6 3.6 3.7 3.7 3.7 3.7  CL 106 103 104 106 106 108  CO2 25 23  --  23 21* 21*  GLUCOSE 97 120* 120* 128* 95 100*  BUN 22* 20 20 19 12 11   CREATININE 1.05 1.00 0.90 0.99 1.05 0.94  CALCIUM 9.3 9.3  --  8.4* 8.6* 8.4*  MG  --   --   --   --   --  1.8    GFR: Estimated Creatinine Clearance: 102.8 mL/min (by C-G formula based on SCr of 0.94 mg/dL).  Liver Function Tests: Recent Labs  Lab 08/31/17 1043 09/03/17 0402  AST 40 24  ALT 56 33  ALKPHOS 44 39  BILITOT 0.9 1.0  PROT 7.5 6.5  ALBUMIN 4.2 3.6    Recent Labs  Lab 08/31/17 1043 09/02/17 0546  LIPASE 36 31      Radiology Studies: Dg Abd 1 View  Result  Date: 09/04/2017 CLINICAL DATA:  Check nasogastric catheter placement EXAM: ABDOMEN - 1 VIEW COMPARISON:  Film from earlier in the same day FINDINGS: Nasogastric catheter has been placed and lies within the stomach. The proximal side port is at the level of the gastroesophageal junction. This could be advanced as clinically needed. Persistent small bowel dilatation is noted. IMPRESSION: Nasogastric catheter as described. Electronically Signed   By: Inez Catalina M.D.   On: 09/04/2017 17:25   Dg Abd 2 Views  Result Date: 09/06/2017 CLINICAL DATA:  Small bowel obstruction EXAM: ABDOMEN - 2 VIEW COMPARISON:  September 05, 2017 FINDINGS: Nasogastric tube tip is in the proximal stomach with the side port at the gastroesophageal junction. There is considerably less bowel dilatation compared to 1 day prior.  There remain loops of mildly dilated bowel with scattered air-fluid levels. No evident free air. Lung bases are clear. IMPRESSION: Nasogastric tube tip in proximal stomach with side port at gastroesophageal junction. Advise advancing nasogastric tube 6-8 cm to insure that side-port as well as tube tip are well within the stomach. Less bowel dilatation compared to 1 day prior with likely partial resolution of bowel obstruction. There do remain loops of dilated bowel with air-fluid levels. No evident free air. Electronically Signed   By: Lowella Grip III M.D.   On: 09/06/2017 10:14   Dg Abd 2 Views  Result Date: 09/05/2017 CLINICAL DATA:  Small bowel obstruction. EXAM: ABDOMEN - 2 VIEW COMPARISON:  09/04/2017 FINDINGS: Nasogastric tube remains in place with tip in proximal stomach. Multiple moderately dilated small bowel loops containing air-fluid levels show mildly increased dilatation since previous study. Scattered bowel gas seen throughout colon, which is nondilated. No evidence of free intraperitoneal air. IMPRESSION: Partial small bowel obstruction, with mild increase in small bowel dilatation since prior study. Nasogastric tube in appropriate position. Electronically Signed   By: Earle Gell M.D.   On: 09/05/2017 09:18     Medications:  Scheduled: . enoxaparin (LOVENOX) injection  50 mg Subcutaneous Q24H  . pantoprazole (PROTONIX) IV  40 mg Intravenous Q24H  . predniSONE  7.5 mg Oral Q breakfast   Continuous: . ciprofloxacin Stopped (09/06/17 0908)  . dextrose 5 % and 0.9 % NaCl with KCl 20 mEq/L 75 mL/hr at 09/06/17 0814  . metronidazole 500 mg (09/06/17 1010)  . sodium chloride Stopped (09/02/17 0700)   QPY:[id number] (DILAUDID) injection, metoCLOPramide (REGLAN) injection, ondansetron **OR** ondansetron (ZOFRAN) IV  Assessment/Plan:  Principal Problem:   Colitis Active Problems:   Rheumatoid arthritis ([place])   Lower abdominal pain   Immunosuppressed status ([place])   Morbid  obesity with body mass index of 40.0-49.9 (H[facility])   Enteritis   Generalized abdominal pain    Small bowel obstruction with colitis/enteritis Resume sepsis on admission.  Sepsis physiology has resolved.  Patient has an NG tube.  Patient was placed on ciprofloxacin and Flagyl.  Patient has had bowel movements.  General surgery is following and managing.  Patient seems to be improving slowly.  Last colonoscopy in July 2017 which showed a polyp in the ascending colon, diverticulosis and internal hemorrhoids.  Pathology revealed tubular adenoma without any malignancy or dysplasia.  History of rheumatoid arthritis Patient is on prednisone at home for the same.  Continue for now.  DVT Prophylaxis: Lovenox    Code Status: Full Code Family Communication: Discussed with the patient and his wife Disposition Plan: Management as outlined above.    LOS: 4 days   [facility] Pager [phone]  09/06/2017, 3:24 PM  If 7PM-7AM, please contact night-coverage at [email or web address], password [facility]

## 2017-09-07 ENCOUNTER — Inpatient Hospital Stay (HOSPITAL_COMMUNITY): Payer: BLUE CROSS/BLUE SHIELD

## 2017-09-07 NOTE — Progress Notes (Signed)
    CC:  Colitis vs SBO    Subjective: Up walking but still distended, few BS, multiple BM's.    Objective: Vital signs in last 24 hours: Temp:  [99 F (37.2 C)-99.3 F (37.4 C)] 99 F (37.2 C) (12/13 0407) Pulse Rate:  [75-84] 75 (12/13 0407) Resp:  [16-18] 18 (12/13 0407) BP: (132-144)/(72-85) 135/73 (12/13 0407) SpO2:  [97 %-99 %] 97 % (12/13 0407) Last BM Date: 09/06/17 PO not recorded NG 600 BM x 3 Afebrile, VSS No labs Film this AM: The NG tube has been removed. There is still slight gaseous distention of small bowel and i.e. residual partial small bowel obstruction is suspected. Some colonic bowel gas is present, and contrast is noted within the colon to the rectum.  IMPRESSION: Still suspect low grade small bowel obstruction. Contrast is noted within the colon. Intake/Output from previous day: 12/12 0701 - 12/13 0700 In: -  Out: 600  Intake/Output this shift: Total I/O In: 360 [P.O.:360] Out: -   General appearance: alert, cooperative and no distress GI: still distended but having flatus and Bm  Lab Results:  Recent Labs    09/05/17 0914 09/06/17 0325  WBC 10.9* 12.0*  HGB 16.1 15.3  HCT 46.5 45.3  PLT 304 321    BMET Recent Labs    09/05/17 0914 09/06/17 0325  NA 137 137  K 3.7 3.7  CL 106 108  CO2 21* 21*  GLUCOSE 95 100*  BUN 12 11  CREATININE 1.05 0.94  CALCIUM 8.6* 8.4*   PT/INR No results for input(s): LABPROT, INR in the last 72 hours.  Recent Labs  Lab 08/31/17 1043 09/03/17 0402  AST 40 24  ALT 56 33  ALKPHOS 44 39  BILITOT 0.9 1.0  PROT 7.5 6.5  ALBUMIN 4.2 3.6     Lipase     Component Value Date/Time   LIPASE 31 09/02/2017 0546     Medications: . enoxaparin (LOVENOX) injection  50 mg Subcutaneous Q24H  . pantoprazole (PROTONIX) IV  40 mg Intravenous Q24H  . predniSONE  7.5 mg Oral Q breakfast    Assessment/Plan Abdominal distension with pain lower abdomen/? SBO/ no hx of abdominal surgery ?  colitis Elevated WBC  Hx of RA on Prednisone/Methotrexate Morbid obesityBody mass index is 31  FEN: IV fluids/Clears today ID: Cipro/Flagyl 09/02/17 =>>day5 DVT: Lovenox Foley: None Follow up: TBD  Plan:  NG out starting clears will see him again in AM.  Despite successful movement of contrast, he still has a fair amount of bowel dilatation.        LOS: 5 days    Raedyn Klinck 09/07/2017 [phone]

## 2017-09-07 NOTE — Progress Notes (Signed)
[facility] PROGRESS NOTE  GAYLAN FAUVER YQI:[id number] DOB: October 05, 1955 DOA: 09/02/2017  PCP: Claretta Fraise, MD  Brief History/Interval Summary: 61 year old male with a past medical history of rheumatoid arthritis, presented with complains of abdominal distention with pain and nausea.  He was found to have enteritis versus colitis.  Initial CT scan did not show any obstruction.  Subsequent films showed more small bowel distention.  Reason for Visit: Small bowel obstruction  Consultants: General surgery  Procedures: None  Antibiotics: Cipro and Flagyl  Subjective/Interval History: Patient states that he is feeling much better.  He has had multiple bowel movements.  Denies any nausea vomiting.  Has been ambulating  ROS: Denies any shortness of breath.  Objective:  Vital Signs  Vitals:   09/06/17 0455 09/06/17 1430 09/06/17 2010 09/07/17 0407  BP: 134/89 132/85 (!) 144/72 135/73  Pulse: 76 84 79 75  Resp: 18 18 16 18   Temp: 98.6 F (37 C) 99.3 F (37.4 C) 99.2 F (37.3 C) 99 F (37.2 C)  TempSrc: Oral Oral Oral Oral  SpO2: 93% 98% 99% 97%  Weight:      Height:        Intake/Output Summary (Last 24 hours) at 09/07/2017 1035 Last data filed at 09/07/2017 3875 Gross per 24 hour  Intake 360 ml  Output 600 ml  Net -240 ml   Filed Weights   09/02/17 1246  Weight: 101.8 kg (224 lb 6.9 oz)    General appearance: Awake alert.  In no distress. Resp: Clear to auscultation bilaterally.  No wheezing rales or rhonchi Cardio: S1-S2 is normal regular.  No S3-S4.  No rubs murmurs of bruit GI: Abdomen is soft.  Noted to be still distended.  Bowel sounds present.  No masses organomegaly. Extremities: No edema Neurologic: No focal neurological deficits.  Lab Results:  Data Reviewed: I have personally reviewed following labs and imaging studies  CBC: Recent Labs  Lab 09/02/17 0546 09/02/17 0559 09/03/17 0402 09/04/17 0346 09/05/17 0914 09/06/17 0325    WBC 16.7*  --  14.8* 12.5* 10.9* 12.0*  NEUTROABS 14.5*  --   --   --   --   --   HGB 16.2 17.7* 15.2 13.8 16.1 15.3  HCT 46.1 52.0 44.6 40.8 46.5 45.3  MCV 98.3  --  99.3 99.3 98.3 97.4  PLT 325  --  294 273 304 643    Basic Metabolic Panel: Recent Labs  Lab 08/31/17 1043 09/02/17 0546 09/02/17 0559 09/03/17 0402 09/05/17 0914 09/06/17 0325  NA 138 136 141 137 137 137  K 3.6 3.6 3.7 3.7 3.7 3.7  CL 106 103 104 106 106 108  CO2 25 23  --  23 21* 21*  GLUCOSE 97 120* 120* 128* 95 100*  BUN 22* 20 20 19 12 11   CREATININE 1.05 1.00 0.90 0.99 1.05 0.94  CALCIUM 9.3 9.3  --  8.4* 8.6* 8.4*  MG  --   --   --   --   --  1.8    GFR: Estimated Creatinine Clearance: 102.8 mL/min (by C-G formula based on SCr of 0.94 mg/dL).  Liver Function Tests: Recent Labs  Lab 08/31/17 1043 09/03/17 0402  AST 40 24  ALT 56 33  ALKPHOS 44 39  BILITOT 0.9 1.0  PROT 7.5 6.5  ALBUMIN 4.2 3.6    Recent Labs  Lab 08/31/17 1043 09/02/17 0546  LIPASE 36 31      Radiology Studies: Dg Abd 1 View  Result  Date: 09/07/2017 CLINICAL DATA:  Followup of small-bowel obstruction, NG tube removed EXAM: ABDOMEN - 1 VIEW COMPARISON:  Abdomen films of 09/06/2017 FINDINGS: The NG tube has been removed. There is still slight gaseous distention of small bowel and i.e. residual partial small bowel obstruction is suspected. Some colonic bowel gas is present, and contrast is noted within the colon to the rectum. IMPRESSION: Still suspect low grade small bowel obstruction. Contrast is noted within the colon. Electronically Signed   By: Ivar Drape M.D.   On: 09/07/2017 09:16   Dg Abd 2 Views  Result Date: 09/06/2017 CLINICAL DATA:  Small bowel obstruction EXAM: ABDOMEN - 2 VIEW COMPARISON:  September 05, 2017 FINDINGS: Nasogastric tube tip is in the proximal stomach with the side port at the gastroesophageal junction. There is considerably less bowel dilatation compared to 1 day prior. There remain loops of  mildly dilated bowel with scattered air-fluid levels. No evident free air. Lung bases are clear. IMPRESSION: Nasogastric tube tip in proximal stomach with side port at gastroesophageal junction. Advise advancing nasogastric tube 6-8 cm to insure that side-port as well as tube tip are well within the stomach. Less bowel dilatation compared to 1 day prior with likely partial resolution of bowel obstruction. There do remain loops of dilated bowel with air-fluid levels. No evident free air. Electronically Signed   By: Lowella Grip III M.D.   On: 09/06/2017 10:14   Dg Abd Portable 1v-small Bowel Obstruction Protocol-initial, 8 Hr Delay  Result Date: 09/07/2017 CLINICAL DATA:  8 hour delayed film for small bowel obstruction protocol. EXAM: PORTABLE ABDOMEN - 1 VIEW COMPARISON:  Abdominal radiograph performed earlier today at 9:45 a.m. FINDINGS: Contrast has progressed to the level of the colon. There is no evidence for bowel obstruction. Distended loops of small bowel likely reflects some degree of ileus. No free intra-abdominal air is seen, though evaluation for free air is limited on a single supine view. The patient's enteric tube is noted ending at the body of the stomach. No acute osseous abnormalities are seen. IMPRESSION: Contrast has progressed to the level of the colon. No evidence of bowel obstruction. Distended small bowel loops likely reflect some degree of ileus. Electronically Signed   By: Garald Balding M.D.   On: 09/07/2017 02:20     Medications:  Scheduled: . enoxaparin (LOVENOX) injection  50 mg Subcutaneous Q24H  . pantoprazole (PROTONIX) IV  40 mg Intravenous Q24H  . predniSONE  7.5 mg Oral Q breakfast   Continuous: . ciprofloxacin Stopped (09/06/17 2158)  . dextrose 5 % and 0.9 % NaCl with KCl 20 mEq/L 75 mL/hr at 09/06/17 0814  . metronidazole 500 mg (09/07/17 0804)  . sodium chloride Stopped (09/02/17 0700)   WNI:[id number] (DILAUDID) injection, metoCLOPramide (REGLAN)  injection, ondansetron **OR** ondansetron (ZOFRAN) IV  Assessment/Plan:  Principal Problem:   Colitis Active Problems:   Rheumatoid arthritis ([place])   Lower abdominal pain   Immunosuppressed status ([place])   Morbid obesity with body mass index of 40.0-49.9 (HCC)   Enteritis   Generalized abdominal pain    Small bowel obstruction with colitis/enteritis Noted to have sepsis on admission.  Sepsis physiology resolved.  Patient was placed on ciprofloxacin and Flagyl.  Subsequent films suggested small bowel obstruction.  General surgery was consulted.  NG tube was placed.  Patient seems to be improving even though imaging studies continue to suggest some degree of obstruction versus ileus.  NG tube removed this morning.  Patient is currently on clear liquid diet.  General surgery is following and managing. Last colonoscopy in July 2017 which showed a polyp in the ascending colon, diverticulosis and internal hemorrhoids.  Pathology revealed tubular adenoma without any malignancy or dysplasia.  History of rheumatoid arthritis Patient is on prednisone at home for the same.  Continue for now.  DVT Prophylaxis: Lovenox    Code Status: Full Code Family Communication: Discussed with the patient Disposition Plan: Management as outlined above.    LOS: 5 days   [facility] [place] [facility] Pager [phone] 09/07/2017, 10:35 AM  If 7PM-7AM, please contact night-coverage at [email or web address], password [facility]

## 2017-09-08 LAB — CBC
HEMATOCRIT: 40.3 % (ref 39.0–52.0)
HEMATOCRIT: 42.1 % (ref 39.0–52.0)
HEMOGLOBIN: 13.7 g/dL (ref 13.0–17.0)
Hemoglobin: 14.5 g/dL (ref 13.0–17.0)
MCH: 33.3 pg (ref 26.0–34.0)
MCH: 33.6 pg (ref 26.0–34.0)
MCHC: 34 g/dL (ref 30.0–36.0)
MCHC: 34.4 g/dL (ref 30.0–36.0)
MCV: 98.1 fL (ref 78.0–100.0)
MCV: 97.5 fL (ref 78.0–100.0)
PLATELETS: 303 10*3/uL (ref 150–400)
Platelets: 278 10*3/uL (ref 150–400)
RBC: 4.11 MIL/uL — ABNORMAL LOW (ref 4.22–5.81)
RBC: 4.32 MIL/uL (ref 4.22–5.81)
RDW: 15.2 % (ref 11.5–15.5)
RDW: 15.1 % (ref 11.5–15.5)
WBC: 7.1 10*3/uL (ref 4.0–10.5)
WBC: 8.5 10*3/uL (ref 4.0–10.5)

## 2017-09-08 LAB — BASIC METABOLIC PANEL
Anion gap: 4 — ABNORMAL LOW (ref 5–15)
BUN: 13 mg/dL (ref 6–20)
CHLORIDE: 111 mmol/L (ref 101–111)
CO2: 22 mmol/L (ref 22–32)
CREATININE: 1.07 mg/dL (ref 0.61–1.24)
Calcium: 8.3 mg/dL — ABNORMAL LOW (ref 8.9–10.3)
GFR calc non Af Amer: >60 mL/min (ref >60)
GLUCOSE: 91 mg/dL (ref 65–99)
Potassium: 3.4 mmol/L — ABNORMAL LOW (ref 3.5–5.1)
Sodium: 137 mmol/L (ref 135–145)

## 2017-09-08 LAB — OCCULT BLOOD X 1 CARD TO LAB, STOOL: Fecal Occult Bld: NEGATIVE

## 2017-09-08 MED ORDER — POTASSIUM CHLORIDE CRYS ER 20 MEQ PO TBCR
40.0000 meq | EXTENDED_RELEASE_TABLET | Freq: Once | ORAL | Status: AC
  Administered 2017-09-08: 40 meq via ORAL
  Filled 2017-09-08: qty 2

## 2017-09-08 MED ORDER — SODIUM CHLORIDE 0.9% FLUSH
3.0000 mL | Freq: Two times a day (BID) | INTRAVENOUS | Status: DC
  Administered 2017-09-08 – 2017-09-09 (×2): 3 mL via INTRAVENOUS

## 2017-09-08 MED ORDER — SODIUM CHLORIDE 0.9% FLUSH: 3.0000 mL | INTRAVENOUS | Status: DC | PRN

## 2017-09-08 MED ORDER — SODIUM CHLORIDE 0.9 % IV SOLN: 250.0000 mL | INTRAVENOUS | Status: DC | PRN

## 2017-09-08 MED ORDER — PANTOPRAZOLE SODIUM 40 MG PO TBEC
40.0000 mg | DELAYED_RELEASE_TABLET | Freq: Every day | ORAL | Status: DC
  Administered 2017-09-09: 40 mg via ORAL
  Filled 2017-09-08 (×2): qty 1

## 2017-09-08 MED ORDER — CIPROFLOXACIN HCL 500 MG PO TABS
500.0000 mg | ORAL_TABLET | Freq: Two times a day (BID) | ORAL | Status: DC
  Administered 2017-09-08 – 2017-09-09 (×2): 500 mg via ORAL
  Filled 2017-09-08 (×2): qty 1

## 2017-09-08 MED ORDER — METRONIDAZOLE 500 MG PO TABS
500.0000 mg | ORAL_TABLET | Freq: Three times a day (TID) | ORAL | Status: DC
  Administered 2017-09-08 – 2017-09-09 (×3): 500 mg via ORAL
  Filled 2017-09-08 (×3): qty 1

## 2017-09-08 NOTE — Progress Notes (Signed)
[facility] Progress Note     Subjective: CC:  Wants to eat solid food but feels bloated and has persistent lower abdominal pain. Reports this AM had a black stool with a pudding-like consistency. Denies history of black stools in the past. Having flatus. Denies nausea or vomiting. Denies fever.  Afebrile, VSS Objective: Vital signs in last 24 hours: Temp:  [98 F (36.7 C)-99.3 F (37.4 C)] 98 F (36.7 C) (12/14 0425) Pulse Rate:  [67-71] 69 (12/14 0425) Resp:  [16-18] 18 (12/14 0425) BP: (111-138)/(72-90) 111/72 (12/14 0425) SpO2:  [99 %-100 %] 100 % (12/14 0425) Last BM Date: 09/06/17  Intake/Output from previous day: 12/13 0701 - 12/14 0700 In: 480 [P.O.:480] Out: -  Intake/Output this shift: No intake/output data recorded.  PE: Gen:  Alert, NAD, pleasant and cooperative Pulm:  Normal effort, clear to auscultation bilaterally Abd: Soft, mild ttp lower abdomen, distended and tympanic, +S all 4 quadrants Skin: warm and dry, no rashes  Psych: A&Ox3   Lab Results:  Recent Labs    09/06/17 0325 09/08/17 0343  WBC 12.0* 7.1  HGB 15.3 13.7  HCT 45.3 40.3  PLT 321 278   BMET Recent Labs    09/06/17 0325 09/08/17 0343  NA 137 137  K 3.7 3.4*  CL 108 111  CO2 21* 22  GLUCOSE 100* 91  BUN 11 13  CREATININE 0.94 1.07  CALCIUM 8.4* 8.3*   PT/INR No results for input(s): LABPROT, INR in the last 72 hours. CMP     Component Value Date/Time   NA 137 09/08/2017 0343   K 3.4 (L) 09/08/2017 0343   CL 111 09/08/2017 0343   CO2 22 09/08/2017 0343   GLUCOSE 91 09/08/2017 0343   BUN 13 09/08/2017 0343   CREATININE 1.07 09/08/2017 0343   CALCIUM 8.3 (L) 09/08/2017 0343   PROT 6.5 09/03/2017 0402   ALBUMIN 3.6 09/03/2017 0402   AST 24 09/03/2017 0402   ALT 33 09/03/2017 0402   ALKPHOS 39 09/03/2017 0402   BILITOT 1.0 09/03/2017 0402   GFRNONAA >60 09/08/2017 0343   GFRAA >60 09/08/2017 0343   Lipase     Component Value Date/Time   LIPASE 31  09/02/2017 0546       Studies/Results: Dg Abd 1 View  Result Date: 09/07/2017 CLINICAL DATA:  Followup of small-bowel obstruction, NG tube removed EXAM: ABDOMEN - 1 VIEW COMPARISON:  Abdomen films of 09/06/2017 FINDINGS: The NG tube has been removed. There is still slight gaseous distention of small bowel and i.e. residual partial small bowel obstruction is suspected. Some colonic bowel gas is present, and contrast is noted within the colon to the rectum. IMPRESSION: Still suspect low grade small bowel obstruction. Contrast is noted within the colon. Electronically Signed   By: Ivar Drape M.D.   On: 09/07/2017 09:16   Dg Abd 2 Views  Result Date: 09/06/2017 CLINICAL DATA:  Small bowel obstruction EXAM: ABDOMEN - 2 VIEW COMPARISON:  September 05, 2017 FINDINGS: Nasogastric tube tip is in the proximal stomach with the side port at the gastroesophageal junction. There is considerably less bowel dilatation compared to 1 day prior. There remain loops of mildly dilated bowel with scattered air-fluid levels. No evident free air. Lung bases are clear. IMPRESSION: Nasogastric tube tip in proximal stomach with side port at gastroesophageal junction. Advise advancing nasogastric tube 6-8 cm to insure that side-port as well as tube tip are well within the stomach. Less bowel dilatation compared to 1 day  prior with likely partial resolution of bowel obstruction. There do remain loops of dilated bowel with air-fluid levels. No evident free air. Electronically Signed   By: Lowella Grip III M.D.   On: 09/06/2017 10:14   Dg Abd Portable 1v-small Bowel Obstruction Protocol-initial, 8 Hr Delay  Result Date: 09/07/2017 CLINICAL DATA:  8 hour delayed film for small bowel obstruction protocol. EXAM: PORTABLE ABDOMEN - 1 VIEW COMPARISON:  Abdominal radiograph performed earlier today at 9:45 a.m. FINDINGS: Contrast has progressed to the level of the colon. There is no evidence for bowel obstruction. Distended loops  of small bowel likely reflects some degree of ileus. No free intra-abdominal air is seen, though evaluation for free air is limited on a single supine view. The patient's enteric tube is noted ending at the body of the stomach. No acute osseous abnormalities are seen. IMPRESSION: Contrast has progressed to the level of the colon. No evidence of bowel obstruction. Distended small bowel loops likely reflect some degree of ileus. Electronically Signed   By: Garald Balding M.D.   On: 09/07/2017 02:20    Anti-infectives: Anti-infectives (From admission, onward)   Start     Dose/Rate Route Frequency Ordered Stop   09/02/17 2000  ciprofloxacin (CIPRO) IVPB 400 mg     400 mg 200 mL/hr over 60 Minutes Intravenous Every 12 hours 09/02/17 0928     09/02/17 1600  metroNIDAZOLE (FLAGYL) IVPB 500 mg     500 mg 100 mL/hr over 60 Minutes Intravenous Every 8 hours 09/02/17 0914     09/02/17 0815  ciprofloxacin (CIPRO) IVPB 400 mg     400 mg 200 mL/hr over 60 Minutes Intravenous  Once 09/02/17 0811 09/02/17 0946   09/02/17 0815  metroNIDAZOLE (FLAGYL) IVPB 500 mg     500 mg 100 mL/hr over 60 Minutes Intravenous  Once 09/02/17 0811 09/02/17 0946      Assessment/Plan Hx of RA on Prednisone/Methotrexate Morbid obesityBody mass index is 31  Abdominal distension and lower abdominal pain Colitis/enteritis with PSBO --  - no hx of abdominal surgery; colonoscopy July 2017 negative for malignancy - pt had NG tube. Was removed 12/13 and started on clears - persistent/increased bloating and now with dark stool - continue clears and check FOBT. Hgb/hct stable. Hopefully can advance diet and progress towards discharge over weekend if no evidence of significant GIB.   FEN: IV fluids/Clears today; hypokalemia 3.4 ID: Cipro/Flagyl 09/02/17 =>>day6; leukocytosis resolved; PO abx today. DVT: Lovenox Foley: None Follow up: TBD   LOS: 6 days    [name] , [facility]  Surgery 09/08/2017, 9:56 AM Pager: [phone] Consults: [phone] Mon-Fri 7:00 am-4:30 pm Sat-Sun 7:00 am-11:30 am

## 2017-09-08 NOTE — Progress Notes (Signed)
[facility] PROGRESS NOTE  Ronald Cameron WGN:[id number] DOB: July 04, 1956 DOA: 09/02/2017  PCP: Claretta Fraise, MD  Brief History/Interval Summary: 61 year old male with a past medical history of rheumatoid arthritis, presented with complains of abdominal distention with pain and nausea.  He was found to have enteritis versus colitis.  Initial CT scan did not show any obstruction.  Subsequent films showed more small bowel distention.  Reason for Visit: Small bowel obstruction  Consultants: General surgery  Procedures: None  Antibiotics: Cipro and Flagyl  Subjective/Interval History: Patient mentioned that he feels much better.  Having bowel movements.  Denies any nausea vomiting.  Later seen by PA with general surgery to whom the patient mentioned that he had had a black stool.  And he also mentioned that he felt more bloated.    ROS: Denies any shortness of breath.  Objective:  Vital Signs  Vitals:   09/07/17 0407 09/07/17 1607 09/07/17 2011 09/08/17 0425  BP: 135/73 130/83 138/90 111/72  Pulse: 75 67 71 69  Resp: 18 18 16 18   Temp: 99 F (37.2 C) 99.3 F (37.4 C) 98.4 F (36.9 C) 98 F (36.7 C)  TempSrc: Oral Oral Oral Oral  SpO2: 97% 99% 100% 100%  Weight:      Height:        Intake/Output Summary (Last 24 hours) at 09/08/2017 1016 Last data filed at 09/07/2017 1607 Gross per 24 hour  Intake 120 ml  Output -  Net 120 ml   Filed Weights   09/02/17 1246  Weight: 101.8 kg (224 lb 6.9 oz)    General appearance: Awake alert.  In no distress. Resp: Clear to auscultation bilaterally. Cardio: S1-S2 is normal regular GI: Abdomen is soft.  Mildly distended.  Bowel sounds present.  No masses organomegaly.  Mildly tender in the lower abdomen and in the left lower quadrant.   Extremities: No edema Neurologic: No focal neurological deficits.  Lab Results:  Data Reviewed: I have personally reviewed following labs and imaging studies  CBC: Recent  Labs  Lab 09/02/17 0546  09/03/17 0402 09/04/17 0346 09/05/17 0914 09/06/17 0325 09/08/17 0343  WBC 16.7*  --  14.8* 12.5* 10.9* 12.0* 7.1  NEUTROABS 14.5*  --   --   --   --   --   --   HGB 16.2   < > 15.2 13.8 16.1 15.3 13.7  HCT 46.1   < > 44.6 40.8 46.5 45.3 40.3  MCV 98.3  --  99.3 99.3 98.3 97.4 98.1  PLT 325  --  294 273 304 321 278   < > = values in this interval not displayed.    Basic Metabolic Panel: Recent Labs  Lab 09/02/17 0546 09/02/17 0559 09/03/17 0402 09/05/17 0914 09/06/17 0325 09/08/17 0343  NA 136 141 137 137 137 137  K 3.6 3.7 3.7 3.7 3.7 3.4*  CL 103 104 106 106 108 111  CO2 23  --  23 21* 21* 22  GLUCOSE 120* 120* 128* 95 100* 91  BUN 20 20 19 12 11 13   CREATININE 1.00 0.90 0.99 1.05 0.94 1.07  CALCIUM 9.3  --  8.4* 8.6* 8.4* 8.3*  MG  --   --   --   --  1.8  --     GFR: Estimated Creatinine Clearance: 90.3 mL/min (by C-G formula based on SCr of 1.07 mg/dL).  Liver Function Tests: Recent Labs  Lab 09/03/17 0402  AST 24  ALT 33  ALKPHOS 39  BILITOT 1.0  PROT 6.5  ALBUMIN 3.6    Recent Labs  Lab 09/02/17 0546  LIPASE 31      Radiology Studies: Dg Abd 1 View  Result Date: 09/07/2017 CLINICAL DATA:  Followup of small-bowel obstruction, NG tube removed EXAM: ABDOMEN - 1 VIEW COMPARISON:  Abdomen films of 09/06/2017 FINDINGS: The NG tube has been removed. There is still slight gaseous distention of small bowel and i.e. residual partial small bowel obstruction is suspected. Some colonic bowel gas is present, and contrast is noted within the colon to the rectum. IMPRESSION: Still suspect low grade small bowel obstruction. Contrast is noted within the colon. Electronically Signed   By: Ivar Drape M.D.   On: 09/07/2017 09:16   Dg Abd Portable 1v-small Bowel Obstruction Protocol-initial, 8 Hr Delay  Result Date: 09/07/2017 CLINICAL DATA:  8 hour delayed film for small bowel obstruction protocol. EXAM: PORTABLE ABDOMEN - 1 VIEW  COMPARISON:  Abdominal radiograph performed earlier today at 9:45 a.m. FINDINGS: Contrast has progressed to the level of the colon. There is no evidence for bowel obstruction. Distended loops of small bowel likely reflects some degree of ileus. No free intra-abdominal air is seen, though evaluation for free air is limited on a single supine view. The patient's enteric tube is noted ending at the body of the stomach. No acute osseous abnormalities are seen. IMPRESSION: Contrast has progressed to the level of the colon. No evidence of bowel obstruction. Distended small bowel loops likely reflect some degree of ileus. Electronically Signed   By: Garald Balding M.D.   On: 09/07/2017 02:20     Medications:  Scheduled: . ciprofloxacin  500 mg Oral BID  . enoxaparin (LOVENOX) injection  50 mg Subcutaneous Q24H  . metroNIDAZOLE  500 mg Oral Q8H  . pantoprazole  40 mg Oral Daily  . predniSONE  7.5 mg Oral Q breakfast  . sodium chloride flush  3 mL Intravenous Q12H   Continuous: . sodium chloride    . sodium chloride Stopped (09/02/17 0700)   NWG:NFAOZH chloride, HYDROmorphone (DILAUDID) injection, metoCLOPramide (REGLAN) injection, ondansetron **OR** ondansetron (ZOFRAN) IV, sodium chloride flush  Assessment/Plan:  Principal Problem:   Colitis Active Problems:   Rheumatoid arthritis ([place])   Lower abdominal pain   Immunosuppressed status ([place])   Morbid obesity with body mass index of 40.0-49.9 (HCC)   Enteritis   Generalized abdominal pain    Small bowel obstruction with colitis/enteritis Noted to have sepsis on admission.  Sepsis physiology resolved.  Patient was placed on ciprofloxacin and Flagyl.  Subsequent films suggested small bowel obstruction.  General surgery was consulted.  NG tube was placed.  Patient improved.  NG tube was removed.  This morning patient had black stool.  We will check stool for occult blood.  Hemoglobin this morning was stable.  We will repeat this afternoon.   Continue PPI.  Change to oral antibiotics.  Continue clear liquid diet.  General surgery is following as well. Last colonoscopy in July 2017 which showed a polyp in the ascending colon, diverticulosis and internal hemorrhoids.  Pathology revealed tubular adenoma without any malignancy or dysplasia.  Potassium level noted to be low this morning.  This will be repleted.  History of rheumatoid arthritis Patient is on prednisone at home for the same.  Continue for now.  DVT Prophylaxis: Lovenox    Code Status: Full Code Family Communication: Discussed with the patient Disposition Plan: Management as outlined above.    LOS: 6 days   Jemell Town  [place] [facility]  [facility] Pager [phone] 09/08/2017, 10:16 AM  If 7PM-7AM, please contact night-coverage at [email or web address], password [facility]

## 2017-09-09 LAB — BASIC METABOLIC PANEL
Anion gap: 7 (ref 5–15)
BUN: 10 mg/dL (ref 6–20)
CHLORIDE: 105 mmol/L (ref 101–111)
CO2: 23 mmol/L (ref 22–32)
CREATININE: 0.98 mg/dL (ref 0.61–1.24)
Calcium: 8.4 mg/dL — ABNORMAL LOW (ref 8.9–10.3)
GFR calc Af Amer: >60 mL/min (ref >60)
GFR calc non Af Amer: >60 mL/min (ref >60)
Glucose, Bld: 89 mg/dL (ref 65–99)
Potassium: 3.5 mmol/L (ref 3.5–5.1)
SODIUM: 135 mmol/L (ref 135–145)

## 2017-09-09 LAB — CBC
HCT: 42.5 % (ref 39.0–52.0)
HEMOGLOBIN: 14.3 g/dL (ref 13.0–17.0)
MCH: 32.7 pg (ref 26.0–34.0)
MCHC: 33.6 g/dL (ref 30.0–36.0)
MCV: 97.3 fL (ref 78.0–100.0)
Platelets: 302 10*3/uL (ref 150–400)
RBC: 4.37 MIL/uL (ref 4.22–5.81)
RDW: 15.1 % (ref 11.5–15.5)
WBC: 5.4 10*3/uL (ref 4.0–10.5)

## 2017-09-09 MED ORDER — CIPROFLOXACIN HCL 500 MG PO TABS: 500.0000 mg | ORAL_TABLET | Freq: Two times a day (BID) | ORAL | 1 refills | Status: DC

## 2017-09-09 MED ORDER — POTASSIUM CHLORIDE CRYS ER 20 MEQ PO TBCR
40.0000 meq | EXTENDED_RELEASE_TABLET | Freq: Once | ORAL | Status: AC
  Administered 2017-09-09: 40 meq via ORAL
  Filled 2017-09-09: qty 2

## 2017-09-09 MED ORDER — METRONIDAZOLE 500 MG PO TABS: 500.0000 mg | ORAL_TABLET | Freq: Three times a day (TID) | ORAL | 1 refills | Status: DC

## 2017-09-09 NOTE — Progress Notes (Signed)
[facility] PROGRESS NOTE  Ronald Cameron HEN:[id number] DOB: 1956/01/16 DOA: 09/02/2017  PCP: Claretta Fraise, MD  Brief History/Interval Summary: 61 year old male with a past medical history of rheumatoid arthritis, presented with complains of abdominal distention with pain and nausea.  He was found to have enteritis versus colitis.  Initial CT scan did not show any obstruction.  Subsequent films showed more small bowel distention.  Reason for Visit: Small bowel obstruction  Consultants: General surgery  Procedures: None  Antibiotics: Cipro and Flagyl  Subjective/Interval History: Patient states that he feels better. Has been passing gas. Has had bowel movements. Denies any nausea or vomiting. Wants to go home. No further black stools.  ROS: Denies any shortness of breath  Objective:  Vital Signs  Vitals:   09/08/17 1430 09/08/17 2004 09/09/17 0422 09/09/17 1240  BP: 129/81 (!) 142/88 (!) 141/78 121/73  Pulse: 66 66 69 84  Resp: 18 16 14 15   Temp: 99.3 F (37.4 C) 98.4 F (36.9 C) 98.6 F (37 C) 98.5 F (36.9 C)  TempSrc: Oral Oral Oral Oral  SpO2: 100% 100% 100% 98%  Weight:      Height:        Intake/Output Summary (Last 24 hours) at 09/09/2017 1408 Last data filed at 09/09/2017 1033 Gross per 24 hour  Intake 360 ml  Output -  Net 360 ml   Filed Weights   09/02/17 1246  Weight: 101.8 kg (224 lb 6.9 oz)    General appearance: Awake alert.  Resp: clear to auscultation bilaterally Cardio:S1S2 normal regular. GI: Abdomen is soft, non tender. Bowel sounds are present. No masses organomegaly.    Extremities: No edema Neurologic: No focal neurological deficits.  Lab Results:  Data Reviewed: I have personally reviewed following labs and imaging studies  CBC: Recent Labs  Lab 09/05/17 0914 09/06/17 0325 09/08/17 0343 09/08/17 1608 09/09/17 0707  WBC 10.9* 12.0* 7.1 8.5 5.4  HGB 16.1 15.3 13.7 14.5 14.3  HCT 46.5 45.3 40.3 42.1 42.5    MCV 98.3 97.4 98.1 97.5 97.3  PLT 304 321 278 303 361    Basic Metabolic Panel: Recent Labs  Lab 09/03/17 0402 09/05/17 0914 09/06/17 0325 09/08/17 0343 09/09/17 0707  NA 137 137 137 137 135  K 3.7 3.7 3.7 3.4* 3.5  CL 106 106 108 111 105  CO2 23 21* 21* 22 23  GLUCOSE 128* 95 100* 91 89  BUN 19 12 11 13 10   CREATININE 0.99 1.05 0.94 1.07 0.98  CALCIUM 8.4* 8.6* 8.4* 8.3* 8.4*  MG  --   --  1.8  --   --     GFR: Estimated Creatinine Clearance: 98.6 mL/min (by C-G formula based on SCr of 0.98 mg/dL).  Liver Function Tests: Recent Labs  Lab 09/03/17 0402  AST 24  ALT 33  ALKPHOS 39  BILITOT 1.0  PROT 6.5  ALBUMIN 3.6    Radiology Studies: No results found.   Medications:  Scheduled: . ciprofloxacin  500 mg Oral BID  . enoxaparin (LOVENOX) injection  50 mg Subcutaneous Q24H  . metroNIDAZOLE  500 mg Oral Q8H  . pantoprazole  40 mg Oral Daily  . predniSONE  7.5 mg Oral Q breakfast  . sodium chloride flush  3 mL Intravenous Q12H   Continuous: . sodium chloride    . sodium chloride Stopped (09/02/17 0700)   WER:XVQMGQ chloride, HYDROmorphone (DILAUDID) injection, metoCLOPramide (REGLAN) injection, ondansetron **OR** ondansetron (ZOFRAN) IV, sodium chloride flush  Assessment/Plan:  Principal Problem:  Colitis Active Problems:   Rheumatoid arthritis ([place])   Lower abdominal pain   Immunosuppressed status ([place])   Morbid obesity with body mass index of 40.0-49.9 (HCC)   Enteritis   Generalized abdominal pain    Small bowel obstruction with colitis/enteritis Noted to have sepsis on admission.  Patient was placed on ciprofloxacin and Flagyl. Sepsis physiology resolved.  Subsequent films suggested small bowel obstruction.  General surgery was consulted.  NG tube was placed.  Patient improved.  NG tube was removed.  Patient had one episode of black stool. FOBT negative. Hemoglobin stable. Unlikely this was GI bleed. Changed to oral antibiotics. Diet advanced  by general surgery. Last colonoscopy in July 2017 which showed a polyp in the ascending colon, diverticulosis and internal hemorrhoids.  Pathology revealed tubular adenoma without any malignancy or dysplasia. Potassium is better. Give additional dose today.  History of rheumatoid arthritis Patient is on prednisone at home for the same.  Continue for now.  DVT Prophylaxis: Lovenox    Code Status: Full Code Family Communication: Discussed with the patient Disposition Plan: Continue to mobilize. If he tolerates soft diet then he may be able to go home later today or tomorrow.    LOS: 7 days   [facility] Pager [phone] 09/09/2017, 2:08 PM  If 7PM-7AM, please contact night-coverage at [email or web address], password [facility]

## 2017-09-09 NOTE — Discharge Summary (Signed)
[facility]  Physician Discharge Summary   Patient ID: Ronald Cameron MRN: [id number] DOB/AGE: 59-Sep-1957 59 y.o.  Admit date: 09/02/2017 Discharge date: 09/09/2017  PCP: Claretta Fraise, MD  DISCHARGE DIAGNOSES:  Principal Problem:   Colitis Active Problems:   Rheumatoid arthritis ([place])   Lower abdominal pain   Immunosuppressed status ([place])   Morbid obesity with body mass index of 40.0-49.9 (HCC)   Enteritis   Generalized abdominal pain   RECOMMENDATIONS FOR OUTPATIENT FOLLOW UP: 1. Outpatient follow up with surgery   DISCHARGE CONDITION: fair   INITIAL HISTORY: 61 year old male with a past medical history of rheumatoid arthritis, presented with complains of abdominal distention with pain and nausea.  He was found to have enteritis versus colitis.  Initial CT scan did not show any obstruction.  Subsequent films showed more small bowel distention.   Consultations:  General Surgery   HOSPITAL COURSE:   Small bowel obstruction with colitis/enteritis Noted to have sepsis on admission.  Patient was placed on ciprofloxacin and Flagyl. Sepsis physiology resolved. Subsequent films suggested small bowel obstruction.  General surgery was consulted.  NG tube was placed.  Patient improved.  NG tube was removed.  Patient had one episode of black stool. FOBT negative. Hemoglobin stable. Unlikely this was GI bleed. Changed to oral antibiotics. Diet advanced by general surgery. Last colonoscopy in July 2017 which showed a polyp in the ascending colon, diverticulosis and internal hemorrhoids.  Pathology revealed tubular adenoma without any malignancy or dysplasia. Patient feels better and insisting on going home. He tolerated soft diet. Cleared by surgery.   History of rheumatoid arthritis Patient is on prednisone at home for the same.  Continue home medications.  Patient very keen on going home. [place] for discharge.   PERTINENT LABS:  The results of significant  diagnostics from this hospitalization (including imaging, microbiology, ancillary and laboratory) are listed below for reference.      Labs: Basic Metabolic Panel: Recent Labs  Lab 09/03/17 0402 09/05/17 0914 09/06/17 0325 09/08/17 0343 09/09/17 0707  NA 137 137 137 137 135  K 3.7 3.7 3.7 3.4* 3.5  CL 106 106 108 111 105  CO2 23 21* 21* 22 23  GLUCOSE 128* 95 100* 91 89  BUN 19 12 11 13 10   CREATININE 0.99 1.05 0.94 1.07 0.98  CALCIUM 8.4* 8.6* 8.4* 8.3* 8.4*  MG  --   --  1.8  --   --    Liver Function Tests: Recent Labs  Lab 09/03/17 0402  AST 24  ALT 33  ALKPHOS 39  BILITOT 1.0  PROT 6.5  ALBUMIN 3.6   CBC: Recent Labs  Lab 09/05/17 0914 09/06/17 0325 09/08/17 0343 09/08/17 1608 09/09/17 0707  WBC 10.9* 12.0* 7.1 8.5 5.4  HGB 16.1 15.3 13.7 14.5 14.3  HCT 46.5 45.3 40.3 42.1 42.5  MCV 98.3 97.4 98.1 97.5 97.3  PLT 304 321 278 303 302    IMAGING STUDIES Dg Abd 1 View  Result Date: 09/07/2017 CLINICAL DATA:  Followup of small-bowel obstruction, NG tube removed EXAM: ABDOMEN - 1 VIEW COMPARISON:  Abdomen films of 09/06/2017 FINDINGS: The NG tube has been removed. There is still slight gaseous distention of small bowel and i.e. residual partial small bowel obstruction is suspected. Some colonic bowel gas is present, and contrast is noted within the colon to the rectum. IMPRESSION: Still suspect low grade small bowel obstruction. Contrast is noted within the colon. Electronically Signed   By: Ivar Drape M.D.   On:  09/07/2017 09:16   Dg Abd 1 View  Result Date: 09/04/2017 CLINICAL DATA:  Check nasogastric catheter placement EXAM: ABDOMEN - 1 VIEW COMPARISON:  Film from earlier in the same day FINDINGS: Nasogastric catheter has been placed and lies within the stomach. The proximal side port is at the level of the gastroesophageal junction. This could be advanced as clinically needed. Persistent small bowel dilatation is noted. IMPRESSION: Nasogastric catheter as  described. Electronically Signed   By: Inez Catalina M.D.   On: 09/04/2017 17:25   Dg Abd 1 View  Result Date: 09/04/2017 CLINICAL DATA:  Abdominal pain and distension EXAM: ABDOMEN - 1 VIEW COMPARISON:  09/03/2017 FINDINGS: There again noted air-filled loops of large and small bowel. The degree of small bowel dilatation has increased somewhat in the interval from the prior exam. No definitive free air is seen. No abnormal mass is noted. Degenerative changes of the lumbar spine are seen. IMPRESSION: Increasing small bowel dilatation when compared with the prior exam. Electronically Signed   By: Inez Catalina M.D.   On: 09/04/2017 14:56   Ct Abdomen Pelvis W Contrast  Result Date: 08/31/2017 CLINICAL DATA:  Acute lower abdominal pain. EXAM: CT ABDOMEN AND PELVIS WITH CONTRAST TECHNIQUE: Multidetector CT imaging of the abdomen and pelvis was performed using the standard protocol following bolus administration of intravenous contrast. CONTRAST:  145mL ISOVUE-300 IOPAMIDOL (ISOVUE-300) INJECTION 61% COMPARISON:  CT scan of March 02, 2015. FINDINGS: Lower chest: No acute abnormality. Hepatobiliary: No gallstones are noted. Focal fatty infiltration of the liver is noted. Pancreas: Unremarkable. No pancreatic ductal dilatation or surrounding inflammatory changes. Spleen: Normal in size without focal abnormality. Adrenals/Urinary Tract: Adrenal glands are unremarkable. Small nonobstructive left renal calculi are noted. No hydronephrosis or renal obstruction is noted. Bladder is unremarkable. Stomach/Bowel: Stomach is within normal limits. Appendix appears normal. No evidence of bowel wall thickening, distention, or inflammatory changes. Sigmoid diverticulosis is noted without inflammation. Vascular/Lymphatic: Aortic atherosclerosis. No enlarged abdominal or pelvic lymph nodes. Reproductive: Prostate is unremarkable. Other: No abdominal wall hernia or abnormality. No abdominopelvic ascites. Musculoskeletal: No acute or  significant osseous findings. IMPRESSION: Fatty infiltration of the liver. Sigmoid diverticulosis without inflammation. Aortic atherosclerosis. Nonobstructive left nephrolithiasis. No other abnormality seen in the abdomen or pelvis. Electronically Signed   By: Marijo Conception, M.D.   On: 08/31/2017 14:41   Dg Abd 2 Views  Result Date: 09/06/2017 CLINICAL DATA:  Small bowel obstruction EXAM: ABDOMEN - 2 VIEW COMPARISON:  September 05, 2017 FINDINGS: Nasogastric tube tip is in the proximal stomach with the side port at the gastroesophageal junction. There is considerably less bowel dilatation compared to 1 day prior. There remain loops of mildly dilated bowel with scattered air-fluid levels. No evident free air. Lung bases are clear. IMPRESSION: Nasogastric tube tip in proximal stomach with side port at gastroesophageal junction. Advise advancing nasogastric tube 6-8 cm to insure that side-port as well as tube tip are well within the stomach. Less bowel dilatation compared to 1 day prior with likely partial resolution of bowel obstruction. There do remain loops of dilated bowel with air-fluid levels. No evident free air. Electronically Signed   By: Lowella Grip III M.D.   On: 09/06/2017 10:14   Dg Abd 2 Views  Result Date: 09/05/2017 CLINICAL DATA:  Small bowel obstruction. EXAM: ABDOMEN - 2 VIEW COMPARISON:  09/04/2017 FINDINGS: Nasogastric tube remains in place with tip in proximal stomach. Multiple moderately dilated small bowel loops containing air-fluid levels show mildly increased dilatation  since previous study. Scattered bowel gas seen throughout colon, which is nondilated. No evidence of free intraperitoneal air. IMPRESSION: Partial small bowel obstruction, with mild increase in small bowel dilatation since prior study. Nasogastric tube in appropriate position. Electronically Signed   By: Earle Gell M.D.   On: 09/05/2017 09:18   Dg Abd Portable 1v-small Bowel Obstruction Protocol-initial, 8  Hr Delay  Result Date: 09/07/2017 CLINICAL DATA:  8 hour delayed film for small bowel obstruction protocol. EXAM: PORTABLE ABDOMEN - 1 VIEW COMPARISON:  Abdominal radiograph performed earlier today at 9:45 a.m. FINDINGS: Contrast has progressed to the level of the colon. There is no evidence for bowel obstruction. Distended loops of small bowel likely reflects some degree of ileus. No free intra-abdominal air is seen, though evaluation for free air is limited on a single supine view. The patient's enteric tube is noted ending at the body of the stomach. No acute osseous abnormalities are seen. IMPRESSION: Contrast has progressed to the level of the colon. No evidence of bowel obstruction. Distended small bowel loops likely reflect some degree of ileus. Electronically Signed   By: Garald Balding M.D.   On: 09/07/2017 02:20   Dg Abd Portable 1v  Result Date: 09/03/2017 CLINICAL DATA:  Followup ileus EXAM: PORTABLE ABDOMEN - 1 VIEW COMPARISON:  CT 09/02/2017 and 08/31/2017. FINDINGS: Dilated fluid and air-filled small intestine. Moderate amount of gas also in the colon. The findings could be due to partial obstruction of the distal small bowel or ileus. In reviewing the CT scans, I think the patient has worsening sigmoid diverticulitis. IMPRESSION: Abnormal gas pattern which could be due to partial small bowel obstruction or ileus. Review of the CT scans leads me to favor the diagnosis of worsening sigmoid diverticulitis, emanating from the sigmoid colon extending towards the right lower quadrant. Electronically Signed   By: Nelson Chimes M.D.   On: 09/03/2017 06:50   Ct Angio Abd/pel W And/or Wo Contrast  Result Date: 09/02/2017 CLINICAL DATA:  Abdominal pain.  Evaluate for mesenteric ischemia. EXAM: CTA ABDOMEN AND PELVIS wITHOUT AND WITH CONTRAST TECHNIQUE: Multidetector CT imaging of the abdomen and pelvis was performed using the standard protocol during with Bolus administration of intravenous contrast.  Multiplanar reconstructed images and MIPs were obtained and reviewed to evaluate the vascular anatomy. CONTRAST:  124mL ISOVUE-370 IOPAMIDOL (ISOVUE-370) INJECTION 76% COMPARISON:  CT 08/31/2017 FINDINGS: VASCULAR Aorta: Aorta normal caliber.  Minimal intimal calcification. Celiac: Widely patent. No ostial calcification. Typical with 3 branch anatomy. SMA: Widely patent with no ostial calcification. The branches SMA are patent with without evidence of occlusion for thrombus with pre with with Renals: Hip with go with bilateral patent single renal artery loose IMA: Patent widely patent with. Inflow: With mild intimal calcification no aneurysm with Proximal Outflow: Normal Veins: SMV appendix feeding branches are normal. Portal vein normal) with in Review of the MIP images confirms the above findings. NON-VASCULAR Lower chest: A thickening mild linear atelectasis. Mild interlobular septal thickening Hepatobiliary: Stroke. Normal liver. Normal gallbladder. No duct dilatation Pancreas: No inflammation normal duct Spleen: Normal spleen Adrenals/Urinary Tract: Adrenal glands and kidneys are normal. Ureters and bladder normal Stomach/Bowel: Stomach, duodenum normal. Bowel is normal caliber. No mucosal enhancement bowel wall thickening. No pneumatosis. There is some fluid within the mesentery of the RIGHT lower quadrant adjacent the small bowel and sigmoid colon (image 153, series 7). There is mild bowel wall thickening of a loop of small bowel through this region (image 135, series 7). Cecum is  normal. Appendix not identified. Ascending, transverse and descending colon normal. Several diverticula through the sigmoid region. No acute inflammation. Rectum normal Lymphatic: No lymphadenopathy Reproductive: Normal prostate Other: No free-fluid Musculoskeletal: No aggressive osseous lesion IMPRESSION: VASCULAR 1. Celiac trunk, SMA, and IMA are widely patent. No ostial calcification. 2. SMV patent 3. Mild intimal calcification  of the aorta and iliac arteries NON-VASCULAR 1. Small amount of fluid in the mesentery of RIGHT lower quadrant adjacent to the sigmoid colon and distal small bowel. Mild bowel wall edema of a loop of small bowel this region. Nonspecific finding which could represent enteritis or colitis. Favor small bowel inflammation with differential including infectious enteritis or ischemic enteritis. No evidence bowel obstruction. 2. No free air or pneumatosis.  No portal venous gas. Electronically Signed   By: Suzy Bouchard M.D.   On: 09/02/2017 07:51    DISCHARGE EXAMINATION: See progress note from earlier today.  DISPOSITION: Home  Discharge Instructions    Call MD for:  difficulty breathing, headache or visual disturbances   Complete by:  As directed    Call MD for:  extreme fatigue   Complete by:  As directed    Call MD for:  hives   Complete by:  As directed    Call MD for:  persistant dizziness or light-headedness   Complete by:  As directed    Call MD for:  persistant nausea and vomiting   Complete by:  As directed    Call MD for:  redness, tenderness, or signs of infection (pain, swelling, redness, odor or green/yellow discharge around incision site)   Complete by:  As directed    Call MD for:  severe uncontrolled pain   Complete by:  As directed    Call MD for:  temperature >100.4   Complete by:  As directed    Diet - low sodium heart healthy   Complete by:  As directed    Discharge instructions   Complete by:  As directed    Low residue diet   Increase activity slowly   Complete by:  As directed    No wound care   Complete by:  As directed         Allergies as of 09/09/2017   No Known Allergies     Medication List    TAKE these medications   ACTEMRA 400 MG/20ML Soln injection Generic drug:  tocilizumab Inject 400 mg into the vein every 30 (thirty) days.   aspirin 81 MG tablet Take 81 mg by mouth daily.   Calcium-Vitamin D 600-400 MG-UNIT Tabs Take 1 tablet by  mouth daily.   ciprofloxacin 500 MG tablet Commonly known as:  CIPRO Take 1 tablet (500 mg total) by mouth 2 (two) times daily.   dicyclomine 10 MG capsule Commonly known as:  BENTYL Take 1 capsule (10 mg total) by mouth 4 (four) times daily -  before meals and at bedtime. Hold for constipation   folic acid 1 MG tablet Commonly known as:  FOLVITE Take 1 mg by mouth daily.   GAS-X EXTRA STRENGTH 125 MG Caps Generic drug:  Simethicone Take 125-375 mg by mouth daily as needed (gas).   methotrexate 2.5 MG tablet Commonly known as:  RHEUMATREX Take 25 mg by mouth every Sunday. Pt takes 10 tablets weekly. Caution:Chemotherapy. Protect from light.   metroNIDAZOLE 500 MG tablet Commonly known as:  FLAGYL Take 1 tablet (500 mg total) by mouth 3 (three) times daily.   polyethylene glycol powder powder Commonly  known as:  GLYCOLAX/MIRALAX Use as directed What changed:    how much to take  how to take this  when to take this  reasons to take this  additional instructions   predniSONE 5 MG tablet Commonly known as:  DELTASONE Take 7.5 mg by mouth daily.   traMADol 50 MG tablet Commonly known as:  ULTRAM Take 1 tablet (50 mg total) by mouth every 6 (six) hours as needed. What changed:    how much to take  reasons to take this   traZODone 100 MG tablet Commonly known as:  DESYREL Take 100 mg by mouth at bedtime as needed for sleep.   vitamin C 1000 MG tablet Take 1,000 mg by mouth daily.        Follow-up Information    Johnathan Hausen, MD Follow up in 3 week(s).   Specialty:  General Surgery Contact information: [address] [place] [address] [phone]        Claretta Fraise, MD. Schedule an appointment as soon as possible for a visit in 1 week(s).   Specialty:  Family Medicine Contact information: [place] [address] [phone]           TOTAL DISCHARGE TIME: 35 mins  [facility] Pager [phone]  09/09/2017, 2:50 PM

## 2017-09-09 NOTE — Progress Notes (Signed)
   Subjective/Chief Complaint: No complaints. Denies pain. Wants to go home   Objective: Vital signs in last 24 hours: Temp:  [98.4 F (36.9 C)-99.3 F (37.4 C)] 98.6 F (37 C) (12/15 0422) Pulse Rate:  [66-69] 69 (12/15 0422) Resp:  [14-18] 14 (12/15 0422) BP: (129-142)/(78-88) 141/78 (12/15 0422) SpO2:  [100 %] 100 % (12/15 0422) Last BM Date: 09/08/17  Intake/Output from previous day: No intake/output data recorded. Intake/Output this shift: No intake/output data recorded.  General appearance: alert and cooperative Resp: clear to auscultation bilaterally Cardio: regular rate and rhythm GI: soft, nontender. good bs  Lab Results:  Recent Labs    09/08/17 1608 09/09/17 0707  WBC 8.5 5.4  HGB 14.5 14.3  HCT 42.1 42.5  PLT 303 302   BMET Recent Labs    09/08/17 0343 09/09/17 0707  NA 137 135  K 3.4* 3.5  CL 111 105  CO2 22 23  GLUCOSE 91 89  BUN 13 10  CREATININE 1.07 0.98  CALCIUM 8.3* 8.4*   PT/INR No results for input(s): LABPROT, INR in the last 72 hours. ABG No results for input(s): PHART, HCO3 in the last 72 hours.  Invalid input(s): PCO2, PO2  Studies/Results: No results found.  Anti-infectives: Anti-infectives (From admission, onward)   Start     Dose/Rate Route Frequency Ordered Stop   09/08/17 2000  ciprofloxacin (CIPRO) tablet 500 mg     500 mg Oral 2 times daily 09/08/17 1014     09/08/17 1400  metroNIDAZOLE (FLAGYL) tablet 500 mg     500 mg Oral Every 8 hours 09/08/17 1014     09/02/17 2000  ciprofloxacin (CIPRO) IVPB 400 mg  Status:  Discontinued     400 mg 200 mL/hr over 60 Minutes Intravenous Every 12 hours 09/02/17 0928 09/08/17 1014   09/02/17 1600  metroNIDAZOLE (FLAGYL) IVPB 500 mg  Status:  Discontinued     500 mg 100 mL/hr over 60 Minutes Intravenous Every 8 hours 09/02/17 0914 09/08/17 1014   09/02/17 0815  ciprofloxacin (CIPRO) IVPB 400 mg     400 mg 200 mL/hr over 60 Minutes Intravenous  Once 09/02/17 0811 09/02/17  0946   09/02/17 0815  metroNIDAZOLE (FLAGYL) IVPB 500 mg     500 mg 100 mL/hr over 60 Minutes Intravenous  Once 09/02/17 0811 09/02/17 0946      Assessment/Plan: s/p * No surgery found * Advance diet Discharge later today on oral abx  LOS: 7 days    TOTH III,[place] S 09/09/2017

## 2017-09-09 NOTE — Discharge Instructions (Signed)
Colitis Colitis is inflammation of the colon. Colitis may last a short time (acute) or it may last a long time (chronic). What are the causes? This condition may be caused by:  Viruses.  Bacteria.  Reactions to medicine.  Certain autoimmune diseases, such as Crohn disease or ulcerative colitis.  What are the signs or symptoms? Symptoms of this condition include:  Diarrhea.  Passing bloody or tarry stool.  Pain.  Fever.  Vomiting.  Tiredness (fatigue).  Weight loss.  Bloating.  Sudden increase in abdominal pain.  Having fewer bowel movements than usual.  How is this diagnosed? This condition is diagnosed with a stool test or a blood test. You may also have other tests, including X-rays, a CT scan, or a colonoscopy. How is this treated? Treatment may include:  Resting the bowel. This involves not eating or drinking for a period of time.  Fluids that are given through an IV tube.  Medicine for pain and diarrhea.  Antibiotic medicines.  Cortisone medicines.  Surgery.  Follow these instructions at home: Eating and drinking  Follow instructions from your health care provider about eating or drinking restrictions.  Drink enough fluid to keep your urine clear or pale yellow.  Work with a dietitian to determine which foods cause your condition to flare up.  Avoid foods that cause flare-ups.  Eat a well-balanced diet. Medicines  Take over-the-counter and prescription medicines only as told by your health care provider.  If you were prescribed an antibiotic medicine, take it as told by your health care provider. Do not stop taking the antibiotic even if you start to feel better. General instructions  Keep all follow-up visits as told by your health care provider. This is important. Contact a health care provider if:  Your symptoms do not go away.  You develop new symptoms. Get help right away if:  You have a fever that does not go away with  treatment.  You develop chills.  You have extreme weakness, fainting, or dehydration.  You have repeated vomiting.  You develop severe pain in your abdomen.  You pass bloody or tarry stool. This information is not intended to replace advice given to you by your health care provider. Make sure you discuss any questions you have with your health care provider. Document Released: 10/20/2004 Document Revised: 02/18/2016 Document Reviewed: 01/05/2015 Elsevier Interactive Patient Education  2018 [place] Bowel Obstruction A small bowel obstruction means that something is blocking the small bowel. The small bowel is also called the small intestine. It is the long tube that connects the stomach to the colon. An obstruction will stop food and fluids from passing through the small bowel. Treatment depends on what is causing the problem and how bad the problem is. Follow these instructions at home:  Get a lot of rest.  Follow your diet as told by your doctor. You may need to: ? Only drink clear liquids until you start to get better. ? Avoid solid foods as told by your doctor.  Take over-the-counter and prescription medicines only as told by your doctor.  Keep all follow-up visits as told by your doctor. This is important. Contact a doctor if:  You have a fever.  You have chills. Get help right away if:  You have pain or cramps that get worse.  You throw up (vomit) blood.  You have a feeling of being sick to your stomach (nausea) that does not go away.  You cannot stop throwing up.  You cannot drink fluids.  You feel confused.  You feel dry or thirsty (dehydrated).  Your belly gets more bloated.  You feel weak or you pass out (faint). This information is not intended to replace advice given to you by your health care provider. Make sure you discuss any questions you have with your health care provider. Document Released: 10/20/2004 Document Revised: 05/09/2016  Document Reviewed: 11/06/2014 Elsevier Interactive Patient Education  [employer].

## 2017-09-20 NOTE — Progress Notes (Signed)
Subjective: CC: hospital follow up PCP: Ronald Fraise, MD NGE:[id number] Ronald Cameron is Ronald 61 y.o. male presenting to clinic today for:  1. Hospital follow up Patient was discharged from the hospital on 09/09/2017 after being treated for small bowel obstruction with associated colitis/enteritis which was appreciated on repeat CT of the abdomen.  He was referred to surgery for outpatient follow-up.  Patient was discharged on Cipro Flagyl.  He is completed the ciprofloxacin and has 1 refill on the Flagyl left.  He has not yet schedule an appointment with [facility] for follow-up.  He notes that his follow-up with gastroenterology is in February.  He is quite unhappy with this given the nature of his hospitalization.  He actually would like to look into establishing with Ronald new gastroenterologist.  He reports good bowel movements since discharge.  He has had 2 normal caliber, soft bowel movements today.  No melena or hematochezia.  No vomiting.  He is tolerating p.o. intake.  He does note that he continues to feel somewhat bloated and is wondering when he will be back to baseline.  No fevers, chills.   ROS: Per HPI  No Known Allergies Past Medical History:  Diagnosis Date  . Collagen vascular disease (HCC)    Rheumatoid Arthritis  . Rheumatoid arthritis ([place])     Current Outpatient Medications:  .  Ascorbic Acid (VITAMIN C) 1000 MG tablet, Take 1,000 mg by mouth daily., Disp: , Rfl:  .  aspirin 81 MG tablet, Take 81 mg by mouth daily., Disp: , Rfl:  .  Calcium Carb-Cholecalciferol (CALCIUM-VITAMIN D) 600-400 MG-UNIT TABS, Take 1 tablet by mouth daily., Disp: , Rfl:  .  ciprofloxacin (CIPRO) 500 MG tablet, Take 1 tablet (500 mg total) by mouth 2 (two) times daily., Disp: 20 tablet, Rfl: 1 .  dicyclomine (BENTYL) 10 MG capsule, Take 1 capsule (10 mg total) by mouth 4 (four) times daily -  before meals and at bedtime. Hold for constipation, Disp: 120 capsule, Rfl: 1 .  folic acid (FOLVITE) 1 MG  tablet, Take 1 mg by mouth daily., Disp: , Rfl:  .  methotrexate (RHEUMATREX) 2.5 MG tablet, Take 25 mg by mouth every Sunday. Pt takes 10 tablets weekly. Caution:Chemotherapy. Protect from light., Disp: , Rfl:  .  metroNIDAZOLE (FLAGYL) 500 MG tablet, Take 1 tablet (500 mg total) by mouth 3 (three) times daily., Disp: 30 tablet, Rfl: 1 .  polyethylene glycol powder (GLYCOLAX/MIRALAX) powder, Use as directed (Patient taking differently: Take 17-34 g by mouth daily as needed for mild constipation. Use as directed), Disp: 255 g, Rfl: 0 .  predniSONE (DELTASONE) 5 MG tablet, Take 7.5 mg by mouth daily. , Disp: , Rfl:  .  Simethicone (GAS-X EXTRA STRENGTH) 125 MG CAPS, Take 125-375 mg by mouth daily as needed (gas)., Disp: , Rfl:  .  tocilizumab (ACTEMRA) 400 MG/20ML SOLN injection, Inject 400 mg into the vein every 30 (thirty) days., Disp: , Rfl:  .  traMADol (ULTRAM) 50 MG tablet, Take 1 tablet (50 mg total) by mouth every 6 (six) hours as needed. (Patient taking differently: Take 50-100 mg by mouth every 6 (six) hours as needed for moderate pain. ), Disp: 20 tablet, Rfl: 0 .  traZODone (DESYREL) 100 MG tablet, Take 100 mg by mouth at bedtime as needed for sleep., Disp: , Rfl:  Social History   Socioeconomic History  . Marital status: Married    Spouse name: Not on file  . Number of children: 0  .  Years of education: Not on file  . Highest education level: Not on file  Social Needs  . Financial resource strain: Not on file  . Food insecurity - worry: Not on file  . Food insecurity - inability: Not on file  . Transportation needs - medical: Not on file  . Transportation needs - non-medical: Not on file  Occupational History  . Occupation: Horticulturist, commercial  Tobacco Use  . Smoking status: Former Smoker    Packs/day: 1.00    Years: 39.00    Pack years: 39.00    Types: Cigarettes    Start date: 07/11/1975    Last attempt to quit: 04/26/2014    Years since quitting: 3.4  . Smokeless tobacco: Never  Used  . Tobacco comment: Quit x 3 years  Substance and Sexual Activity  . Alcohol use: No    Alcohol/week: 0.0 oz  . Drug use: No  . Sexual activity: Not on file  Other Topics Concern  . Not on file  Social History Narrative  . Not on file   Family History  Problem Relation Age of Onset  . Heart attack Father   . Diabetes Mother   . Colon cancer Neg Hx   . Liver disease Neg Hx     Objective: Office vital signs reviewed. BP (!) 144/84   Pulse 65   Temp 98 F (36.7 C) (Oral)   Ht 5\' 11"  (1.803 m)   Wt 224 lb (101.6 kg)   BMI 31.24 kg/m   Physical Examination:  General: Awake, alert, obese, No acute distress HEENT: Normal, MMM GI: protuberant, soft, non-tender, mildly distended, bowel sounds present x4, no hepatomegaly, no splenomegaly, no masses  Assessment/ Plan: 61 y.o. male   1. Hospital discharge follow-up Doing well since discharge from the hospital.  He continues to have some bloating of the abdomen but is otherwise asymptomatic.  His physical exam is significantly better than previous exam.  I did discuss with him that he should avoid the Bentyl if he is not having abdominal cramping.  He should also avoid this medication if he becomes constipated.  He voiced good understanding.  I have referred him to the general surgeon that saw him in the hospital so that an appointment will be made.  I have also referred him to let our gastroenterology to establish care.  Patient will follow-up with his primary care doctor as needed. - Ambulatory referral to General Surgery - Ambulatory referral to Gastroenterology  2. H/O small bowel obstruction - Ambulatory referral to General Surgery - Ambulatory referral to Gastroenterology  3. Enteritis Continue antibiotics as prescribed. - Ambulatory referral to General Surgery   Orders Placed This Encounter  Procedures  . Ambulatory referral to General Surgery    Referral Priority:   Routine    Referral Type:   Surgical     Referral Reason:   Specialty Services Required    Requested Specialty:   General Surgery    Number of Visits Requested:   1  . Ambulatory referral to Gastroenterology    Referral Priority:   Routine    Referral Type:   Consultation    Referral Reason:   Specialty Services Required    Number of Visits Requested:   Ronald Cameron, Ronald Cameron [phone]

## 2017-09-22 ENCOUNTER — Encounter: Payer: Self-pay | Admitting: Family Medicine

## 2017-09-22 ENCOUNTER — Ambulatory Visit (INDEPENDENT_AMBULATORY_CARE_PROVIDER_SITE_OTHER): Payer: BLUE CROSS/BLUE SHIELD | Admitting: Family Medicine

## 2017-09-22 VITALS — BP 144/84 | HR 65 | Temp 98.0°F | Ht 71.0 in | Wt 224.0 lb

## 2017-09-22 DIAGNOSIS — Z8719 Personal history of other diseases of the digestive system: Secondary | ICD-10-CM | POA: Diagnosis not present

## 2017-09-22 DIAGNOSIS — Z09 Encounter for follow-up examination after completed treatment for conditions other than malignant neoplasm: Secondary | ICD-10-CM

## 2017-09-22 DIAGNOSIS — K529 Noninfective gastroenteritis and colitis, unspecified: Secondary | ICD-10-CM | POA: Diagnosis not present

## 2017-09-25 ENCOUNTER — Other Ambulatory Visit: Payer: Self-pay

## 2017-09-27 ENCOUNTER — Telehealth: Payer: Self-pay | Admitting: Gastroenterology

## 2017-09-27 MED ORDER — DICYCLOMINE HCL 10 MG PO CAPS: 10.0000 mg | ORAL_CAPSULE | Freq: Three times a day (TID) | ORAL | 3 refills | Status: DC

## 2017-09-27 NOTE — Telephone Encounter (Signed)
Referral received for patient to have hosp follow up from 12.8.18 and to establish with new gi. Pt previously saw [facility] in past year. Patient not requesting certain doctor, but wants to switch due to not getting help at other office and ending up in the hospital. Referral and records printed from epic will be placed on DOD for 12.28.18; Dr.Jacobs's desk for review.

## 2017-10-17 ENCOUNTER — Encounter: Payer: Self-pay | Admitting: Gastroenterology

## 2017-10-17 NOTE — Telephone Encounter (Signed)
Dr. Ardis Hughs reviewed records and has accepted patient. Ok to schedule next new patient Office visit. Appointment scheduled for 12/01/17 @ 1:30pm

## 2017-10-20 ENCOUNTER — Ambulatory Visit (HOSPITAL_COMMUNITY)
Admission: RE | Admit: 2017-10-20 | Discharge: 2017-10-20 | Disposition: A | Discharge status: Home / Self Care | Payer: BLUE CROSS/BLUE SHIELD | Source: Ambulatory Visit | Attending: Family | Admitting: Family

## 2017-10-20 ENCOUNTER — Encounter: Payer: Self-pay | Admitting: Family

## 2017-10-20 ENCOUNTER — Ambulatory Visit (INDEPENDENT_AMBULATORY_CARE_PROVIDER_SITE_OTHER): Payer: BLUE CROSS/BLUE SHIELD | Admitting: Family

## 2017-10-20 VITALS — BP 119/80 | HR 81 | Temp 98.0°F | Ht 71.0 in | Wt 217.0 lb

## 2017-10-20 DIAGNOSIS — M25431 Effusion, right wrist: Secondary | ICD-10-CM | POA: Insufficient documentation

## 2017-10-20 DIAGNOSIS — M25531 Pain in right wrist: Secondary | ICD-10-CM | POA: Diagnosis not present

## 2017-10-20 DIAGNOSIS — I808 Phlebitis and thrombophlebitis of other sites: Secondary | ICD-10-CM | POA: Insufficient documentation

## 2017-10-20 DIAGNOSIS — M25439 Effusion, unspecified wrist: Secondary | ICD-10-CM

## 2017-10-20 NOTE — Patient Instructions (Signed)
Phlebitis Phlebitis is soreness and swelling (inflammation) of a vein. This can occur in your arms, legs, or torso (trunk), as well as deeper inside your body. Phlebitis is usually not serious when it occurs close to the surface of the body. However, it can cause serious problems when it occurs in a vein deeper inside the body. What are the causes? Phlebitis can be triggered by various things, including:  Reduced blood flow through your veins. This can happen with: ? Bed rest over a long period. ? Long-distance travel. ? Injury. ? Surgery. ? Being overweight (obese) or pregnant.  Having an IV tube put in the vein and getting certain medicines through the vein.  Cancer and cancer treatment.  Use of illegal drugs taken through the vein.  Inflammatory diseases.  Inherited (genetic) diseases that increase the risk of blood clots.  Hormone therapy, such as birth control pills.  What are the signs or symptoms?  Red, tender, swollen, and painful area on your skin. Usually, the area will be long and narrow.  Firmness along the center of the affected area. This can indicate that a blood clot has formed.  Low-grade fever. How is this diagnosed? A health care provider can usually diagnose phlebitis by examining the affected area and asking about your symptoms. To check for infection or blood clots, your health care provider may order blood tests or an ultrasound exam of the area. Blood tests and your family history may also indicate if you have an underlying genetic disease that causes blood clots. Occasionally, a piece of tissue is taken from the body (biopsy sample) if an unusual cause of phlebitis is suspected. How is this treated? Treatment will vary depending on the severity of the condition and the area of the body affected. Treatment may include:  Use of a warm compress or heating pad.  Use of compression stockings or bandages.  Anti-inflammatory medicines.  Removal of any IV  tube that may be causing the problem.  Medicines that kill germs (antibiotics) if an infection is present.  Blood-thinning medicines if a blood clot is suspected or present.  In rare cases, surgery may be needed to remove damaged sections of vein.  Follow these instructions at home:  Only take over-the-counter or prescription medicines as directed by your health care provider. Take all medicines exactly as prescribed.  Raise (elevate) the affected area above the level of your heart as directed by your health care provider.  Apply a warm compress or heating pad to the affected area as directed by your health care provider. Do not sleep with the heating pad.  Use compression stockings or bandages as directed. These will speed healing and prevent the condition from coming back.  If you are on blood thinners: ? Get follow-up blood tests as directed by your health care provider. ? Check with your health care provider before using any new medicines. ? Carry a medical alert card or wear your medical alert jewelry to show that you are on blood thinners.  For phlebitis in the legs: ? Avoid prolonged standing or bed rest. ? Keep your legs moving. Raise your legs when sitting or lying.  Do not smoke.  Women, particularly those over the age of 35, should consider the risks and benefits of taking the contraceptive pill. This kind of hormone treatment can increase your risk for blood clots.  Follow up with your health care provider as directed. Contact a health care provider if:  You have unusual bruising or any   bleeding problems.  Your swelling or pain in the affected area is not improving.  You are on anti-inflammatory medicine, and you develop belly (abdominal) pain. Get help right away if:  You have a sudden onset of chest pain or difficulty breathing.  You have a fever or persistent symptoms for more than 2-3 days.  You have a fever and your symptoms suddenly get worse. This  information is not intended to replace advice given to you by your health care provider. Make sure you discuss any questions you have with your health care provider. Document Released: 09/06/2001 Document Revised: 02/18/2016 Document Reviewed: 05/20/2013 Elsevier Interactive Patient Education  2017 Elsevier Inc.  

## 2017-10-20 NOTE — Progress Notes (Signed)
   Subjective:    Patient ID: Ronald Cameron, male    DOB: 06-17-1956, 62 y.o.   MRN: [id number]  HPI PT presents to the office today with right wrist pain that started a week ago. Pt states he feels like his vein is "puffed" out. Pt was at his rheumatologists this morning who said it could be a "blood clot" and needed to follow up with his PCP.     Review of Systems  All other systems reviewed and are negative.      Objective:   Physical Exam  Constitutional: He is oriented to person, place, and time. He appears well-developed and well-nourished. No distress.  HENT:  Head: Normocephalic.  Eyes: Pupils are equal, round, and reactive to light. Right eye exhibits no discharge. Left eye exhibits no discharge.  Neck: Normal range of motion. Neck supple. No thyromegaly present.  Cardiovascular: Normal rate, regular rhythm, normal heart sounds and intact distal pulses.  No murmur heard. Pulmonary/Chest: Effort normal and breath sounds normal. No respiratory distress. He has no wheezes.  Abdominal: Soft. Bowel sounds are normal. He exhibits no distension. There is no tenderness.  Musculoskeletal: He exhibits tenderness. He exhibits no edema.  Tenderness and hard nodule along ulnar and medial aspect of  The forearm   Neurological: He is alert and oriented to person, place, and time. No cranial nerve deficit.  Skin: Skin is warm and dry. No rash noted. No erythema.  Psychiatric: He has a normal mood and affect. His behavior is normal. Judgment and thought content normal.  Vitals reviewed.     BP 119/80   Pulse 81   Temp 98 F (36.7 C) (Oral)   Ht 5\' 11"  (1.803 m)   Wt 217 lb (98.4 kg)   BMI 30.27 kg/m      Assessment & Plan:  1. Right wrist pain - US Venous Img Upper Uni Right; Future  2. Wrist swelling - US Venous Img Upper Uni Right; Future  3. Phlebitis of right upper extremity   Will do [place] to rule out DVT Warm compresses  Elevate arm     Evelina Dun,  FNP

## 2017-11-02 ENCOUNTER — Ambulatory Visit: Payer: BLUE CROSS/BLUE SHIELD | Admitting: Gastroenterology

## 2017-12-01 ENCOUNTER — Encounter (INDEPENDENT_AMBULATORY_CARE_PROVIDER_SITE_OTHER): Payer: Self-pay

## 2017-12-01 ENCOUNTER — Encounter: Payer: Self-pay | Admitting: Gastroenterology

## 2017-12-01 ENCOUNTER — Ambulatory Visit (INDEPENDENT_AMBULATORY_CARE_PROVIDER_SITE_OTHER): Payer: BLUE CROSS/BLUE SHIELD | Admitting: Gastroenterology

## 2017-12-01 VITALS — BP 130/82 | HR 68 | Ht 71.0 in | Wt 218.2 lb

## 2017-12-01 DIAGNOSIS — R6881 Early satiety: Secondary | ICD-10-CM

## 2017-12-01 NOTE — Progress Notes (Signed)
HPI: This is a   pleasant 63 year old man whom I am meeting for the first time today.  He was referred by his primary care physician Dr. Livia Snellen  Chief complaint is recent hospitalization for abdominal pain, "colitis" on examination  He spent about 1 week at [facility] this past December for abdominal pains.  White blood cell count was slightly elevated and 1 of his 2 CT scans suggested some mild "wall edema in a loop of small bowel" he was put on ciprofloxacin and Flagyl and eventually his pains improved well enough for him to go home.  Since he has been home pains have definitely improved.  He does have some mild fullness in his lower abdomen.  He feels like he is carrying a weight.  He also has a significant sensation of early satiety whenever he eats  Not losing weight.   He is having regular, solid BMs.  No changes.  No overt bleeding  Eating meals, sweats  He takes alleve; sometimes once a day.    Old Data Reviewed: 08/2017: CT scan abd/pelvis with IV and oral contrast:  Fatty infiltration of the liver.Sigmoid diverticulosis without inflammation.Aortic atherosclerosis. Nonobstructive left nephrolithiasis. No other abnormality seen in the abdomen or pelvis.  08/2017 CT angio abd/pelvis:  VASCULAR1. Celiac trunk, SMA, and IMA are widely patent. No ostial calcification. 2. SMV patent 3. Mild intimal calcification of the aorta and iliac arteries NON-VASCULAR 1. Small amount of fluid in the mesentery of RIGHT lower quadrant adjacent to the sigmoid colon and distal small bowel. Mild bowel wall edema of a loop of small bowel this region. Nonspecific finding which could represent enteritis or colitis. Favor small bowel inflammation with differential including infectious enteritis or ischemic enteritis. No evidence bowel obstruction. 2. No free air or pneumatosis.  No portal venous gas.  Labs 08/2017:  CBC normal except WBC 14k, cmet normal, HIV neg, lipase normal, UA negative, FOBT  neg  03/2016 Colonoscopy: Dr. [name] for routine screening: 56mm adenoma removed, diverticulosis, hemorrhoids.    Review of systems: Pertinent positive and negative review of systems were noted in the above HPI section. All other review negative.   Past Medical History:  Diagnosis Date  . Arthritis   . Bowel obstruction ([place])   . Collagen vascular disease (HCC)    Rheumatoid Arthritis  . Colon polyps   . Diverticulosis   . Kidney stones   . Rheumatoid arthritis [facility])     Past Surgical History:  Procedure Laterality Date  . COLONOSCOPY N/A 04/13/2016   Dr. [name]: 14 mm polyp in the ascending colon removed, diverticulosis in the distal descending colon and sigmoid colon, nonbleeding internal hemorrhoids. Pathology-tubular adenoma. Next colonoscopy 5 years.  . ELBOW SURGERY     X 3 last year. Staph infection but not MRSA per patient. infectious disease. out of work 6 months.   . TONSILLECTOMY      Current Outpatient Medications  Medication Sig Dispense Refill  . Ascorbic Acid (VITAMIN C) 1000 MG tablet Take 1,000 mg by mouth daily.    Marland Kitchen aspirin 81 MG tablet Take 81 mg by mouth daily.    . Calcium Carb-Cholecalciferol (CALCIUM-VITAMIN D) 600-400 MG-UNIT TABS Take 1 tablet by mouth daily.    . folic acid (FOLVITE) 1 MG tablet Take 1 mg by mouth daily.    . Methotrexate Sodium (METHOTREXATE, PF,) 50 MG/2ML injection INJECT 1.0 ML WEEKLY **DISCARD EXCESS - 1 TIME USE VIAL**  3  . predniSONE (DELTASONE) 10 MG  tablet 7.5 mg.     . traMADol (ULTRAM) 50 MG tablet Take 1 tablet (50 mg total) by mouth every 6 (six) hours as needed. (Patient taking differently: Take 50-100 mg by mouth every 6 (six) hours as needed for moderate pain. ) 20 tablet 0  . traZODone (DESYREL) 100 MG tablet Take 100 mg by mouth at bedtime as needed for sleep.     No current facility-administered medications for this visit.     Allergies as of 12/01/2017  . (No Known Allergies)    Family History  Problem  Relation Age of Onset  . Heart attack Father   . Diabetes Mother   . Colon cancer Neg Hx   . Liver disease Neg Hx     Social History   Socioeconomic History  . Marital status: Married    Spouse name: Not on file  . Number of children: 0  . Years of education: Not on file  . Highest education level: Not on file  Social Needs  . Financial resource strain: Not on file  . Food insecurity - worry: Not on file  . Food insecurity - inability: Not on file  . Transportation needs - medical: Not on file  . Transportation needs - non-medical: Not on file  Occupational History  . Occupation: Horticulturist, commercial  Tobacco Use  . Smoking status: Former Smoker    Packs/day: 1.00    Years: 39.00    Pack years: 39.00    Types: Cigarettes    Start date: 07/11/1975    Last attempt to quit: 04/26/2014    Years since quitting: 3.6  . Smokeless tobacco: Never Used  . Tobacco comment: Quit x 3 years  Substance and Sexual Activity  . Alcohol use: No    Alcohol/week: 0.0 oz  . Drug use: No  . Sexual activity: Not on file  Other Topics Concern  . Not on file  Social History Narrative  . Not on file     Physical Exam: BP 130/82   Pulse 68   Ht 5\' 11"  (1.803 m)   Wt 218 lb 3.2 oz (99 kg)   SpO2 95%   BMI 30.43 kg/m  Constitutional: generally well-appearing Psychiatric: alert and oriented x3 Eyes: extraocular movements intact Mouth: oral pharynx moist, no lesions Neck: supple no lymphadenopathy Cardiovascular: heart regular rate and rhythm Lungs: clear to auscultation bilaterally Abdomen: soft, nontender, nondistended, no obvious ascites, no peritoneal signs, normal bowel sounds Extremities: no lower extremity edema bilaterally Skin: no lesions on visible extremities   Assessment and plan: 62 y.o. male with early satiety following hospitalization 2-3 months ago  Not sure what exactly was the cause of his hospitalization 2-3 months ago.  1 of his 2 CAT scans suggested some mild small  bowel thickening, inflammation.  Perhaps he had an enteritis.  His symptoms have for the most part improved.  He does have significant early satiety but fortunately no weight loss since his hospitalization.  I recommended EGD to work that up, exclude significant neoplastic etiologies.    Please see the "Patient Instructions" section for addition details about the plan.   Owens Loffler, MD [facility] 12/01/2017, 1:37 PM  Cc: Claretta Fraise, MD

## 2017-12-01 NOTE — Patient Instructions (Addendum)
You will be set up for an upper endoscopy for early satiety  Normal BMI (Body Mass Index- based on height and weight) is between 19 and 25. Your BMI today is Body mass index is 30.43 kg/m. Marland Kitchen Please consider follow up  regarding your BMI with your Primary Care Provider.

## 2018-01-09 ENCOUNTER — Encounter: Payer: Self-pay | Admitting: Gastroenterology

## 2018-02-03 ENCOUNTER — Other Ambulatory Visit: Payer: Self-pay

## 2018-02-03 ENCOUNTER — Encounter (HOSPITAL_COMMUNITY): Payer: Self-pay | Admitting: Emergency Medicine

## 2018-02-03 DIAGNOSIS — Z79899 Other long term (current) drug therapy: Secondary | ICD-10-CM | POA: Diagnosis not present

## 2018-02-03 DIAGNOSIS — R1011 Right upper quadrant pain: Secondary | ICD-10-CM | POA: Diagnosis present

## 2018-02-03 DIAGNOSIS — N200 Calculus of kidney: Secondary | ICD-10-CM | POA: Diagnosis not present

## 2018-02-03 DIAGNOSIS — N201 Calculus of ureter: Secondary | ICD-10-CM | POA: Diagnosis not present

## 2018-02-03 DIAGNOSIS — Z87891 Personal history of nicotine dependence: Secondary | ICD-10-CM | POA: Diagnosis not present

## 2018-02-03 DIAGNOSIS — Z7982 Long term (current) use of aspirin: Secondary | ICD-10-CM | POA: Diagnosis not present

## 2018-02-03 LAB — URINALYSIS, ROUTINE W REFLEX MICROSCOPIC
Bacteria, UA: NONE SEEN
Bilirubin Urine: NEGATIVE
GLUCOSE, UA: NEGATIVE mg/dL
KETONES UR: 5 mg/dL — AB
LEUKOCYTES UA: NEGATIVE
Nitrite: NEGATIVE
PH: 6 (ref 5.0–8.0)
PROTEIN: 30 mg/dL — AB
Specific Gravity, Urine: 1.027 (ref 1.005–1.030)

## 2018-02-03 LAB — BASIC METABOLIC PANEL
ANION GAP: 9 (ref 5–15)
BUN: 22 mg/dL — AB (ref 6–20)
CO2: 23 mmol/L (ref 22–32)
Calcium: 8.7 mg/dL — ABNORMAL LOW (ref 8.9–10.3)
Chloride: 108 mmol/L (ref 101–111)
Creatinine, Ser: 1.04 mg/dL (ref 0.61–1.24)
GFR calc Af Amer: >60 mL/min (ref >60)
GFR calc non Af Amer: >60 mL/min (ref >60)
GLUCOSE: 97 mg/dL (ref 65–99)
POTASSIUM: 3.9 mmol/L (ref 3.5–5.1)
Sodium: 140 mmol/L (ref 135–145)

## 2018-02-03 LAB — CBC
HEMATOCRIT: 41 % (ref 39.0–52.0)
HEMOGLOBIN: 14 g/dL (ref 13.0–17.0)
MCH: 32.8 pg (ref 26.0–34.0)
MCHC: 34.1 g/dL (ref 30.0–36.0)
MCV: 96 fL (ref 78.0–100.0)
Platelets: 382 10*3/uL (ref 150–400)
RBC: 4.27 MIL/uL (ref 4.22–5.81)
RDW: 15.7 % — AB (ref 11.5–15.5)
WBC: 8.8 10*3/uL (ref 4.0–10.5)

## 2018-02-03 NOTE — ED Notes (Signed)
Culture sent down with UA °

## 2018-02-03 NOTE — ED Triage Notes (Signed)
Pt comes in complaining of right flank and RLQ pain. Patient hx of kidney stones with known stones in both kidneys. Patient complaining of oliguria that began shortly before the pain started. Patient able to urinate normally this morning.

## 2018-02-04 ENCOUNTER — Emergency Department (HOSPITAL_COMMUNITY): Payer: BLUE CROSS/BLUE SHIELD

## 2018-02-04 ENCOUNTER — Emergency Department (HOSPITAL_COMMUNITY)
Admission: EM | Admit: 2018-02-04 | Discharge: 2018-02-04 | Disposition: A | Discharge status: Home / Self Care | Payer: BLUE CROSS/BLUE SHIELD | Attending: Emergency Medicine | Admitting: Emergency Medicine

## 2018-02-04 ENCOUNTER — Encounter (HOSPITAL_COMMUNITY): Payer: Self-pay | Admitting: Radiology

## 2018-02-04 DIAGNOSIS — N201 Calculus of ureter: Secondary | ICD-10-CM

## 2018-02-04 DIAGNOSIS — N200 Calculus of kidney: Secondary | ICD-10-CM

## 2018-02-04 MED ORDER — ONDANSETRON HCL 4 MG PO TABS: 4.0000 mg | ORAL_TABLET | Freq: Four times a day (QID) | ORAL | 0 refills | Status: DC | PRN

## 2018-02-04 MED ORDER — TAMSULOSIN HCL 0.4 MG PO CAPS: 0.4000 mg | ORAL_CAPSULE | Freq: Every day | ORAL | 0 refills | Status: DC

## 2018-02-04 MED ORDER — KETOROLAC TROMETHAMINE 30 MG/ML IJ SOLN
30.0000 mg | Freq: Once | INTRAMUSCULAR | Status: AC
  Administered 2018-02-04: 30 mg via INTRAVENOUS
  Filled 2018-02-04: qty 1

## 2018-02-04 MED ORDER — ONDANSETRON HCL 4 MG/2ML IJ SOLN
4.0000 mg | Freq: Once | INTRAMUSCULAR | Status: AC
  Administered 2018-02-04: 4 mg via INTRAVENOUS
  Filled 2018-02-04: qty 2

## 2018-02-04 MED ORDER — MORPHINE SULFATE (PF) 4 MG/ML IV SOLN
4.0000 mg | Freq: Once | INTRAVENOUS | Status: AC
  Administered 2018-02-04: 4 mg via INTRAVENOUS
  Filled 2018-02-04: qty 1

## 2018-02-04 MED ORDER — OXYCODONE-ACETAMINOPHEN 5-325 MG PO TABS: 1.0000 | ORAL_TABLET | ORAL | 0 refills | Status: DC | PRN

## 2018-02-04 MED ORDER — SODIUM CHLORIDE 0.9 % IV BOLUS
1000.0000 mL | Freq: Once | INTRAVENOUS | Status: AC
  Administered 2018-02-04: 1000 mL via INTRAVENOUS

## 2018-02-04 NOTE — Discharge Instructions (Signed)
Take ibuprofen or naproxen for less severe pain, or to combine with oxycodone-acetaminophen get additional pain relief.  Return if pain is not being adequately controlled, or if you start running a fever.

## 2018-02-04 NOTE — ED Provider Notes (Signed)
[facility] DEPT Provider Note   CSN: [id number] Arrival date & time: 02/03/18  2113     History   Chief Complaint Chief Complaint  Patient presents with  . Nephrolithiasis    HPI Ronald Cameron is a 62 y.o. male.  The history is provided by the patient.  He has history of rheumatoid arthritis, kidney stones, bowel obstruction and comes in with right flank pain which started 2 days ago.  Pain is radiating around to the right lower quadrant, but not into the groin.  Pain subsided that day and then recurred during the afternoon.  Pain is severe and he rates it at 10/10.  Nothing makes it better, nothing makes it worse.  There is associated nausea but no vomiting.  He denies urinary urgency, frequency, tenesmus, dysuria.  He denies fever or chills.  He has not taken anything for pain.  He states pain is similar to what he has had with kidney stones in the past.  Past Medical History:  Diagnosis Date  . Arthritis   . Bowel obstruction ([place])   . Collagen vascular disease ([facility])    Rheumatoid Arthritis  . Colon polyps   . Diverticulosis   . Kidney stones   . Rheumatoid arthritis [facility])     Patient Active Problem List   Diagnosis Date Noted  . Enteritis   . Generalized abdominal pain   . Immunosuppressed status ([place]) 09/02/2017  . Morbid obesity with body mass index of 40.0-49.9 ([place]) 09/02/2017  . Aortic atherosclerosis ([place]) 09/01/2017  . Acute bilateral lower abdominal pain 08/31/2017  . Diarrhea 11/17/2016  . Abnormal hepatitis serology 11/17/2016  . Renal lesion 05/06/2016  . History of colonic polyps   . Diverticulosis of colon without hemorrhage   . Bloating 04/08/2016  . Encounter for screening colonoscopy 04/08/2016  . Colitis 04/06/2015  . Fatty liver 04/06/2015  . Early satiety 02/20/2015  . Abdominal pain, epigastric 02/20/2015  . Abdominal swelling, generalized 02/20/2015  . High risk medication use 02/20/2015  . Septic  olecranon bursitis of right elbow 12/16/2013  . Pain in elbow 12/16/2013  . EXTERNAL HEMORRHOIDS 12/18/2008  . GERD 12/18/2008  . NEPHROLITHIASIS, HX OF 12/18/2008  . Rheumatoid arthritis ([place]) 12/17/2008  . Hemorrhoids 12/15/2008  . RECTAL BLEEDING 12/15/2008  . RECTAL PAIN 12/15/2008  . Lower abdominal pain 12/15/2008    Past Surgical History:  Procedure Laterality Date  . COLONOSCOPY N/A 04/13/2016   Dr. [name]: 14 mm polyp in the ascending colon removed, diverticulosis in the distal descending colon and sigmoid colon, nonbleeding internal hemorrhoids. Pathology-tubular adenoma. Next colonoscopy 5 years.  . ELBOW SURGERY     X 3 last year. Staph infection but not MRSA per patient. infectious disease. out of work 6 months.   . TONSILLECTOMY          Home Medications    Prior to Admission medications   Medication Sig Start Date End Date Taking? Authorizing Provider  Ascorbic Acid (VITAMIN C) 1000 MG tablet Take 1,000 mg by mouth daily.    [provider]  aspirin 81 MG tablet Take 81 mg by mouth daily.    [provider]  Calcium Carb-Cholecalciferol (CALCIUM-VITAMIN D) 600-400 MG-UNIT TABS Take 1 tablet by mouth daily.    [provider]  folic acid (FOLVITE) 1 MG tablet Take 1 mg by mouth daily.    [provider]  Methotrexate Sodium (METHOTREXATE, PF,) 50 MG/2ML injection INJECT 1.0 ML WEEKLY **DISCARD EXCESS - 1  TIME USE VIAL** 09/12/17   [provider]  predniSONE (DELTASONE) 10 MG tablet 7.5 mg.  10/14/17   [provider]  traMADol (ULTRAM) 50 MG tablet Take 1 tablet (50 mg total) by mouth every 6 (six) hours as needed. Patient taking differently: Take 50-100 mg by mouth every 6 (six) hours as needed for moderate pain.  08/31/17   Milton Ferguson, MD  traZODone (DESYREL) 100 MG tablet Take 100 mg by mouth at bedtime as needed for sleep.    [provider]    Family History Family History  Problem Relation Age  of Onset  . Heart attack Father   . Diabetes Mother   . Colon cancer Neg Hx   . Liver disease Neg Hx     Social History Social History   Tobacco Use  . Smoking status: Former Smoker    Packs/day: 1.00    Years: 39.00    Pack years: 39.00    Types: Cigarettes    Start date: 07/11/1975    Last attempt to quit: 04/26/2014    Years since quitting: 3.7  . Smokeless tobacco: Never Used  . Tobacco comment: Quit x 3 years  Substance Use Topics  . Alcohol use: No    Alcohol/week: 0.0 oz  . Drug use: No     Allergies   Patient has no known allergies.   Review of Systems Review of Systems  All other systems reviewed and are negative.    Physical Exam Updated Vital Signs BP 138/69 (BP Location: Left Arm)   Pulse 67   Temp 98.2 F (36.8 C) (Oral)   Resp 20   Ht 5\' 11"  (1.803 m)   Wt 98.9 kg (218 lb)   SpO2 100%   BMI 30.40 kg/m   Physical Exam  Nursing note and vitals reviewed.  62 year old male, resting comfortably and in no acute distress. Vital signs are normal. Oxygen saturation is 100%, which is normal. Head is normocephalic and atraumatic. PERRLA, EOMI. Oropharynx is clear. Neck is nontender and supple without adenopathy or JVD. Back is nontender in the midline.  There is mild right CVA tenderness. Lungs are clear without rales, wheezes, or rhonchi. Chest is nontender. Heart has regular rate and rhythm without murmur. Abdomen is soft, flat, with mild right lower quadrant tenderness.  There is no rebound or guarding.  There are no masses or hepatosplenomegaly and peristalsis is hypoactive. Extremities have no cyanosis or edema, full range of motion is present. Skin is warm and dry without rash. Neurologic: Mental status is normal, cranial nerves are intact, there are no motor or sensory deficits.  ED Treatments / Results  Labs (all labs ordered are listed, but only abnormal results are displayed) Labs Reviewed  URINALYSIS, ROUTINE W REFLEX MICROSCOPIC -  Abnormal; Notable for the following components:      Result Value   Hgb urine dipstick MODERATE (*)    Ketones, ur 5 (*)    Protein, ur 30 (*)    RBC / HPF >50 (*)    All other components within normal limits  BASIC METABOLIC PANEL - Abnormal; Notable for the following components:   BUN 22 (*)    Calcium 8.7 (*)    All other components within normal limits  CBC - Abnormal; Notable for the following components:   RDW 15.7 (*)    All other components within normal limits    Radiology Ct Renal Stone Study  Result Date: 02/04/2018 CLINICAL DATA:  62 year old  male with history of kidney stones presenting with flank pain. EXAM: CT ABDOMEN AND PELVIS WITHOUT CONTRAST TECHNIQUE: Multidetector CT imaging of the abdomen and pelvis was performed following the standard protocol without IV contrast. COMPARISON:  CT of the abdomen pelvis dated 09/02/2017 FINDINGS: Evaluation of this exam is limited in the absence of intravenous contrast. Lower chest: Mild interstitial coarsening. The visualized lung bases are otherwise clear. There is coronary vascular calcification. No intra-abdominal free air or free fluid. Hepatobiliary: No focal liver abnormality is seen. No gallstones, gallbladder wall thickening, or biliary dilatation. Pancreas: Unremarkable. No pancreatic ductal dilatation or surrounding inflammatory changes. Spleen: Normal in size without focal abnormality. Adrenals/Urinary Tract: The adrenal glands are unremarkable. There are two nonobstructing left renal calculi measure up to 4 mm in the pole. No hydronephrosis. The right kidney is unremarkable. There is a 2 mm stone in the distal right ureter. The urinary bladder is unremarkable. Stomach/Bowel: There is sigmoid diverticulosis without active inflammatory changes. There is moderate stool throughout the colon. No bowel dilatation or evidence of obstruction. The appendix is normal. Vascular/Lymphatic: Moderate aortoiliac atherosclerotic disease. No  portal venous gas. There is no adenopathy. Reproductive: The prostate and seminal vesicles are grossly unremarkable. No pelvic mass Other: None Musculoskeletal: Osteopenia.  No acute osseous pathology. IMPRESSION: 1. Small nonobstructing left renal calculi as well as a 2 mm stone in the distal right ureter. No hydronephrosis. 2. Sigmoid diverticulosis. No bowel obstruction or active inflammation. Normal appendix. 3.  Aortic Atherosclerosis (ICD10-I70.0). Electronically Signed   By: Anner Crete M.D.   On: 02/04/2018 05:37    Procedures Procedures   Medications Ordered in ED Medications  sodium chloride 0.9 % bolus 1,000 mL (has no administration in time range)  ondansetron (ZOFRAN) injection 4 mg (has no administration in time range)  ketorolac (TORADOL) 30 MG/ML injection 30 mg (has no administration in time range)  morphine 4 MG/ML injection 4 mg (has no administration in time range)     Initial Impression / Assessment and Plan / ED Course  I have reviewed the triage vital signs and the nursing notes.  Pertinent labs & imaging results that were available during my care of the patient were reviewed by me and considered in my medical decision making (see chart for details).  Right flank pain consistent with urolithiasis.  Doubt biliary tract disease, pancreatitis, pyelonephritis.  Old records are reviewed, and prior CT scans have shown stones in the left kidney.  I see no ED visits for urolithiasis.  He will be sent for a renal stone protocol CT scan.  Meantime, laboratory work-up does show hematuria consistent with urolithiasis, and BUN is elevated consistent with mild dehydration.  He will be given IV fluids, morphine, ondansetron, ketorolac.  He feels much better after above-noted treatment.  CT scan shows 2 mm distal right ureteral calculus with mild hydronephrosis, 2 calculi in the left kidney which are nonobstructing.  Labs are reassuring-normal renal function and normal WBC.  He is  discharged with prescriptions for oxycodone-acetaminophen, ondansetron, tamsulosin.  Referred to urology for follow-up.  Return precautions discussed.  Final Clinical Impressions(s) / ED Diagnoses   Final diagnoses:  Ureterolithiasis  Nephrolithiasis    ED Discharge Orders        Ordered    oxyCODONE-acetaminophen (PERCOCET) 5-325 MG tablet  Every 4 hours PRN     02/04/18 0648    ondansetron (ZOFRAN) 4 MG tablet  Every 6 hours PRN     02/04/18 0648    tamsulosin (  FLOMAX) 0.4 MG CAPS capsule  Daily     82/41/75 3010       Delora Fuel, MD 40/45/91 [phone]

## 2018-02-04 NOTE — ED Notes (Signed)
Patient transported to CT 

## 2018-02-13 ENCOUNTER — Encounter: Payer: Self-pay | Admitting: Physician Assistant

## 2018-02-13 ENCOUNTER — Ambulatory Visit (INDEPENDENT_AMBULATORY_CARE_PROVIDER_SITE_OTHER): Payer: BLUE CROSS/BLUE SHIELD | Admitting: Physician Assistant

## 2018-02-13 VITALS — BP 132/78 | HR 67 | Temp 97.0°F | Ht 71.0 in | Wt 217.2 lb

## 2018-02-13 DIAGNOSIS — M545 Low back pain, unspecified: Secondary | ICD-10-CM

## 2018-02-13 MED ORDER — PREDNISONE 10 MG PO TABS: 10.0000 mg | ORAL_TABLET | Freq: Two times a day (BID) | ORAL | 0 refills | Status: DC

## 2018-02-13 NOTE — Progress Notes (Signed)
  Subjective:     Patient ID: Ronald Cameron, male   DOB: 06/29/1956, 62 y.o.   MRN: [id number]  HPI Pt with lower back pain x 1 day after crawling under house    Review of Systems Denies any radiation to the lower ext No change in bowel or bladder function No OTC meds for sx He has been using his Percocet + Hx of Rheum arthritis and has regular f/u with specialist Also with Hx of DDD    Objective:   Physical Exam  Constitutional: He appears well-developed and well-nourished. He appears distressed.  Nursing note and vitals reviewed. + TTP along the mid L-spine area FROM of L-spine with increase of sx with hyperextension and rotation SLR neg Muscle strength distal good DTR 2+/= lower ext Sensory good distal     Assessment:     1. Acute midline low back pain without sciatica        Plan:     Heat/Ice Gentle stretching Avoid prolonged sitting Will increase his Prednisone to 10mg  bid x 3 days, 10mg  qd x 3 days and then decrease to his standard 5mg  qd He also had some issues with breaking out in spont sweats ever since stating his new inject through the Rheum Recommended he discuss this further with his specialist F/U here prn

## 2018-02-13 NOTE — Patient Instructions (Signed)

## 2018-03-02 ENCOUNTER — Encounter: Payer: Self-pay | Admitting: Gastroenterology

## 2018-03-02 IMAGING — US US RENAL
1 series · 14 of 25 positions shown · non-contrast
Comparison: CT 03/02/2015.

CLINICAL DATA: Follow-up renal lesion.  Abnormal CT.

EXAM:
RENAL / URINARY TRACT ULTRASOUND COMPLETE

[Series 1: us renal · 0.26mm/px · 14 of 52 slices shown]
[im 1/52]
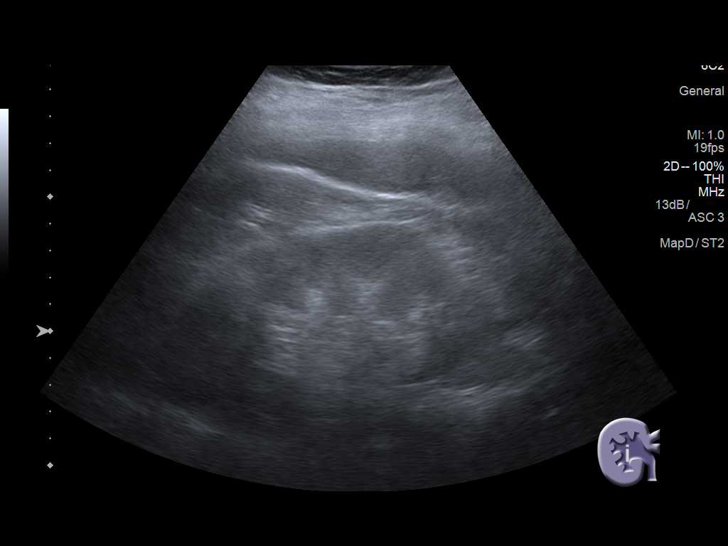
[im 5/52]
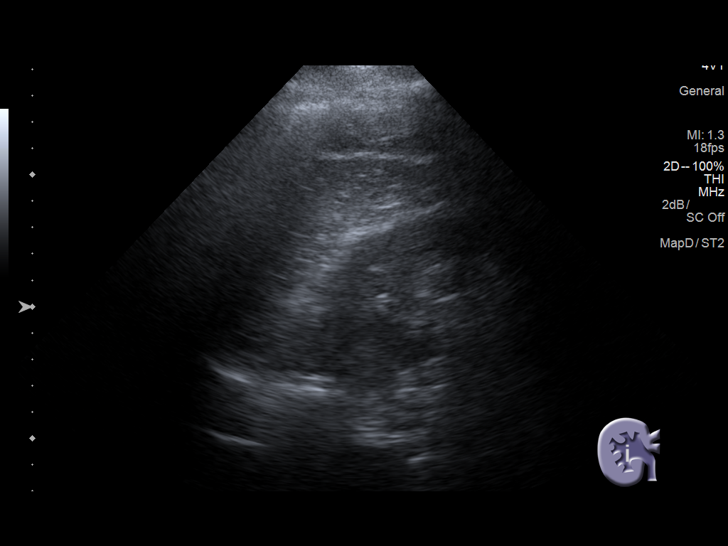
[im 9/52]
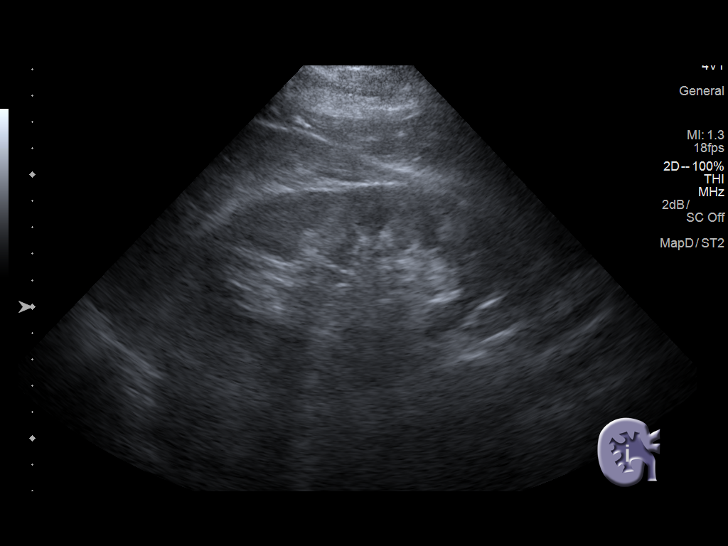
[im 13/52]
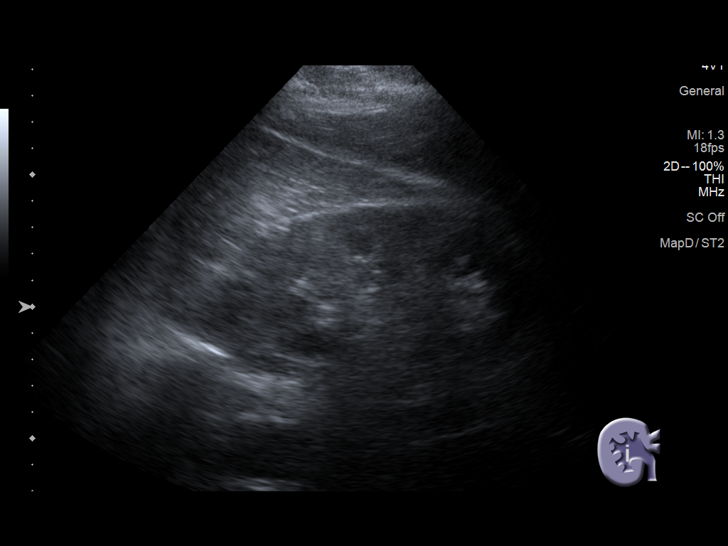
[im 18/52]
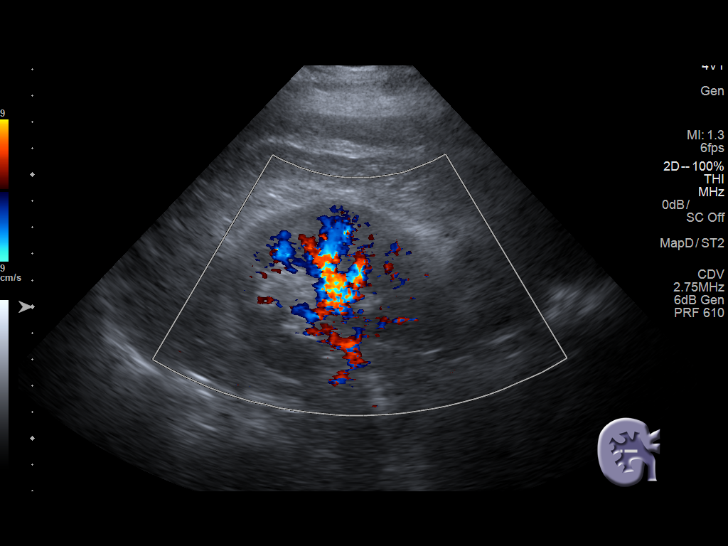
[im 20/52]
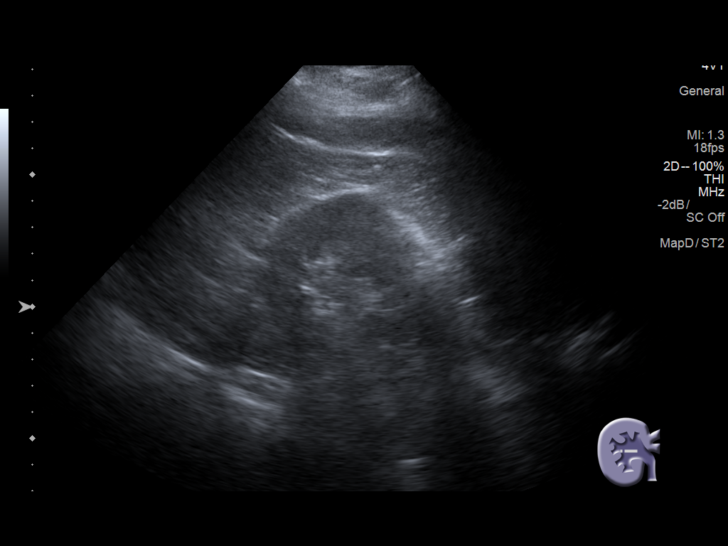
[im 24/52]
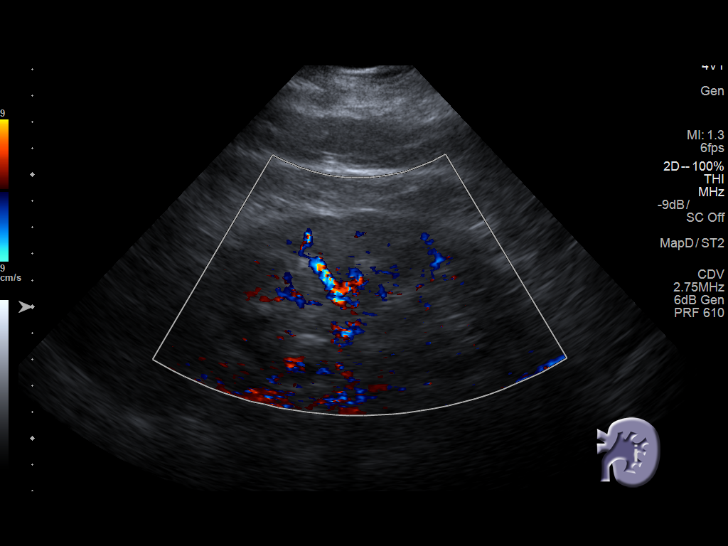
[im 28/52]
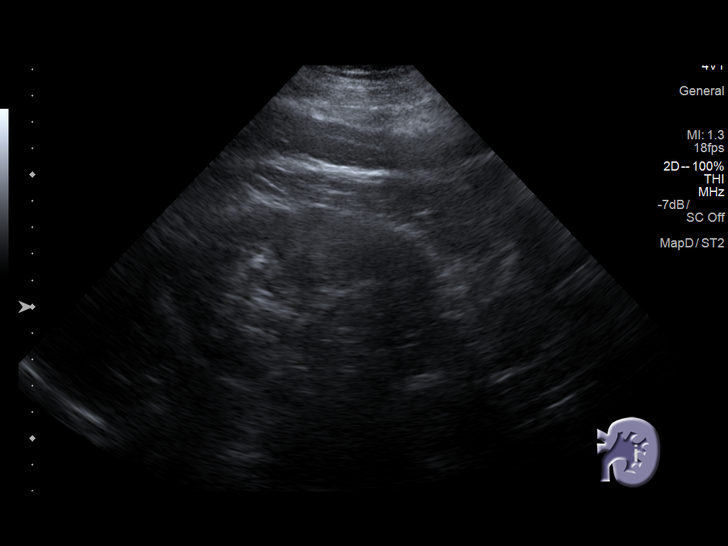
[im 32/52]
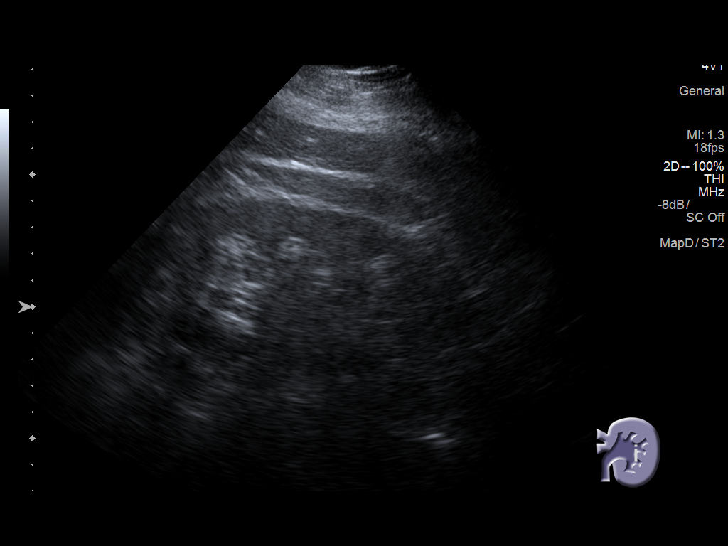
[im 35/52]
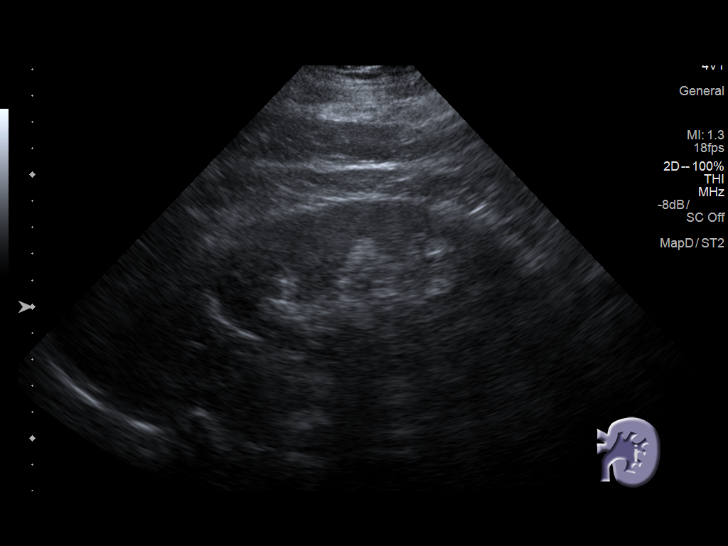
[im 39/52]
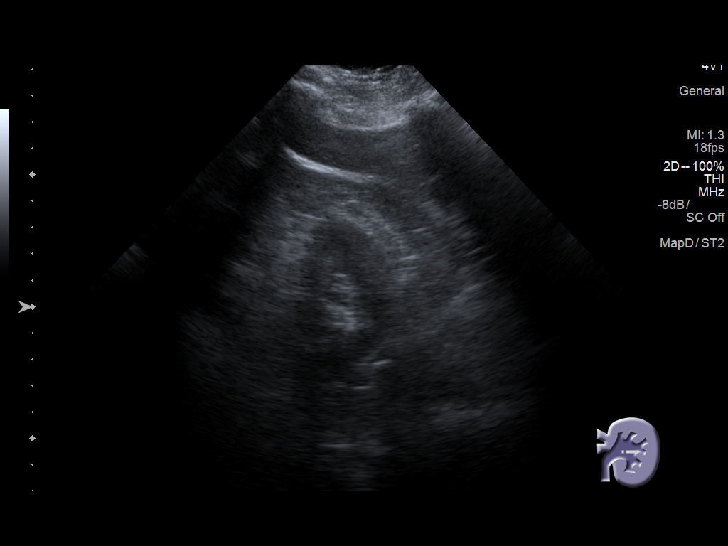
[im 43/52]
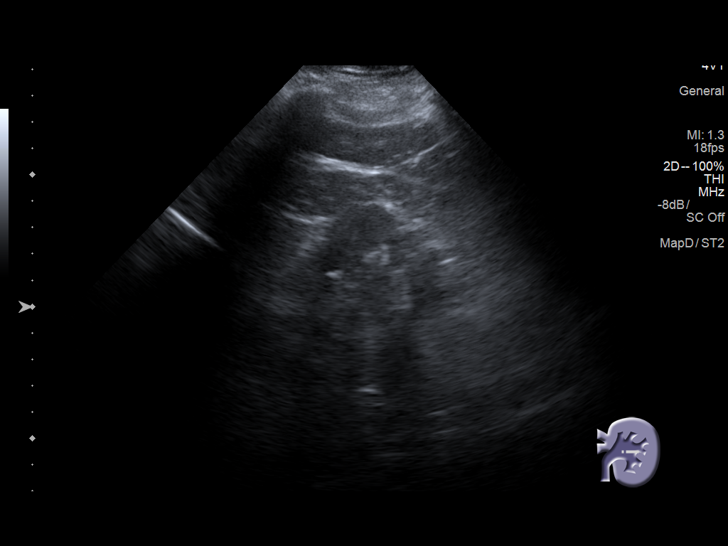
[im 47/52]
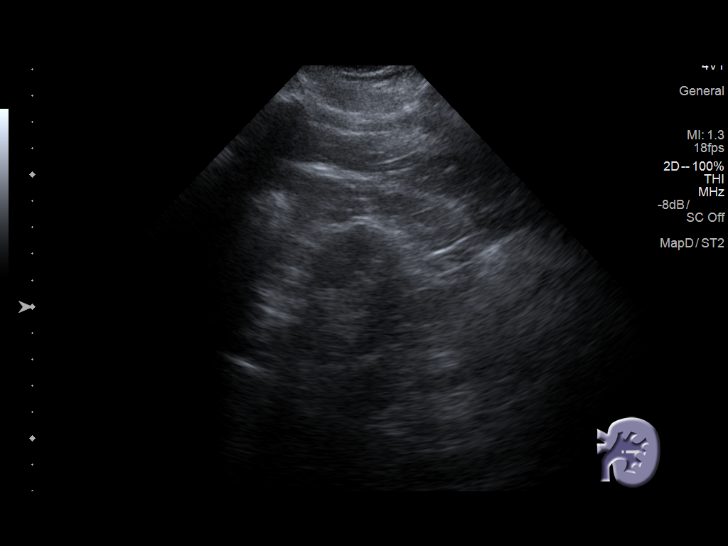
[im 52/52]
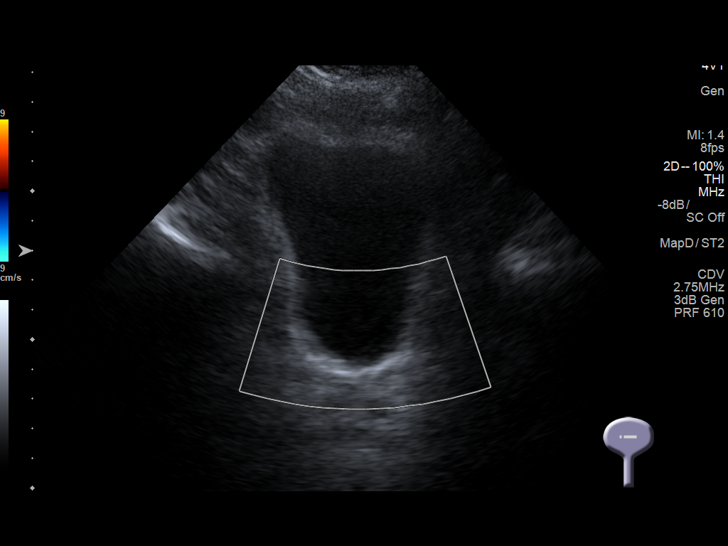

[14 of 25 positions shown; findings below may reference images not displayed]

FINDINGS: Right Kidney:

Length: 12.0 some. Echogenicity within normal limits. No mass or
hydronephrosis visualized.

Left Kidney:

Length: 12.3 cm. Echogenicity within normal limits. No
hydronephrosis visualized. 1.3 x 1.9 x 1.1 cm simple cyst left
kidney corresponds CT abnormality. Nonobstructing renal calyceal
stones versus vascular calcification noted.

Bladder:

Appears normal for degree of bladder distention.
IMPRESSION: 1.  1.3 cm simple cyst left kidney corresponds CT abnormality.

2. Nonobstructing calyceal stones versus vascular calcifications
left kidney.

## 2018-08-24 ENCOUNTER — Emergency Department (HOSPITAL_COMMUNITY)
Admission: EM | Admit: 2018-08-24 | Discharge: 2018-08-24 | Disposition: A | Discharge status: Home / Self Care | Payer: Self-pay | Attending: Emergency Medicine | Admitting: Emergency Medicine

## 2018-08-24 ENCOUNTER — Emergency Department (HOSPITAL_COMMUNITY): Payer: Self-pay

## 2018-08-24 ENCOUNTER — Encounter (HOSPITAL_COMMUNITY): Payer: Self-pay | Admitting: Emergency Medicine

## 2018-08-24 ENCOUNTER — Other Ambulatory Visit: Payer: Self-pay

## 2018-08-24 DIAGNOSIS — Z79899 Other long term (current) drug therapy: Secondary | ICD-10-CM | POA: Insufficient documentation

## 2018-08-24 DIAGNOSIS — Y999 Unspecified external cause status: Secondary | ICD-10-CM | POA: Insufficient documentation

## 2018-08-24 DIAGNOSIS — S42251A Displaced fracture of greater tuberosity of right humerus, initial encounter for closed fracture: Secondary | ICD-10-CM | POA: Insufficient documentation

## 2018-08-24 DIAGNOSIS — S43004A Unspecified dislocation of right shoulder joint, initial encounter: Secondary | ICD-10-CM

## 2018-08-24 DIAGNOSIS — Y939 Activity, unspecified: Secondary | ICD-10-CM | POA: Insufficient documentation

## 2018-08-24 DIAGNOSIS — Y92008 Other place in unspecified non-institutional (private) residence as the place of occurrence of the external cause: Secondary | ICD-10-CM | POA: Insufficient documentation

## 2018-08-24 DIAGNOSIS — Z87891 Personal history of nicotine dependence: Secondary | ICD-10-CM | POA: Insufficient documentation

## 2018-08-24 DIAGNOSIS — W108XXA Fall (on) (from) other stairs and steps, initial encounter: Secondary | ICD-10-CM | POA: Insufficient documentation

## 2018-08-24 MED ORDER — HYDROCODONE-ACETAMINOPHEN 5-325 MG PO TABS: 1.0000 | ORAL_TABLET | ORAL | 0 refills | Status: DC | PRN

## 2018-08-24 MED ORDER — PROPOFOL 10 MG/ML IV BOLUS
INTRAVENOUS | Status: AC
  Administered 2018-08-24: 20 mg
  Filled 2018-08-24: qty 20

## 2018-08-24 MED ORDER — PROPOFOL 10 MG/ML IV BOLUS: 1.0000 mg/kg | Freq: Once | INTRAVENOUS | Status: DC

## 2018-08-24 MED ORDER — PROPOFOL 10 MG/ML IV BOLUS
INTRAVENOUS | Status: AC | PRN
  Administered 2018-08-24: 30 mg via INTRAVENOUS
  Administered 2018-08-24: 50 mg via INTRAVENOUS

## 2018-08-24 MED ORDER — HYDROMORPHONE HCL 1 MG/ML IJ SOLN
1.0000 mg | Freq: Once | INTRAMUSCULAR | Status: AC
  Administered 2018-08-24: 1 mg via INTRAVENOUS
  Filled 2018-08-24: qty 1

## 2018-08-24 MED ORDER — IBUPROFEN 600 MG PO TABS: 600.0000 mg | ORAL_TABLET | Freq: Four times a day (QID) | ORAL | 0 refills | Status: DC | PRN

## 2018-08-24 NOTE — ED Notes (Signed)
Water given  

## 2018-08-24 NOTE — ED Triage Notes (Addendum)
Fell down 2 steps and landed on right shoulder pta. Arrived a/o. Right arm in sling. Pt diaphoretic. Pt had total 100 mcg of fentanyl in route. Radial pulse strong. Able to move fingers and feel touch. Possible deformity noted to anterior right shoulder with pain on palpation.

## 2018-08-24 NOTE — ED Provider Notes (Signed)
[facility] EMERGENCY DEPARTMENT Provider Note   CSN: [id number] Arrival date & time: 08/24/18  1353     History   Chief Complaint Chief Complaint  Patient presents with  . Shoulder Injury    HPI WORTHY BOSCHERT is a 62 y.o. male.  HPI  62 y/o male - hx of RA - presents immediately after falling down his steps when he missed a step - fell on the R shoulder and took a direct injury - acute onset of pain and mild swelling and deformity of the shoulder - denies head injury, numbness or weakness of the hand / arm.  n other injuries.  The patient was transported by the paramedics who gave over 100 mcg of fentanyl which the patient states did not help him.  They placed him in a sling prehospital  Past Medical History:  Diagnosis Date  . Arthritis   . Bowel obstruction ([place])   . Collagen vascular disease (HCC)    Rheumatoid Arthritis  . Colon polyps   . Diverticulosis   . Kidney stones   . Rheumatoid arthritis [employer])     Patient Active Problem List   Diagnosis Date Noted  . Enteritis   . Generalized abdominal pain   . Immunosuppressed status ([place]) 09/02/2017  . Morbid obesity with body mass index of 40.0-49.9 ([place]) 09/02/2017  . Aortic atherosclerosis ([place]) 09/01/2017  . Acute bilateral lower abdominal pain 08/31/2017  . Diarrhea 11/17/2016  . Abnormal hepatitis serology 11/17/2016  . Renal lesion 05/06/2016  . History of colonic polyps   . Diverticulosis of colon without hemorrhage   . Bloating 04/08/2016  . Encounter for screening colonoscopy 04/08/2016  . Colitis 04/06/2015  . Fatty liver 04/06/2015  . Early satiety 02/20/2015  . Abdominal pain, epigastric 02/20/2015  . Abdominal swelling, generalized 02/20/2015  . High risk medication use 02/20/2015  . Septic olecranon bursitis of right elbow 12/16/2013  . Pain in elbow 12/16/2013  . EXTERNAL HEMORRHOIDS 12/18/2008  . GERD 12/18/2008  . NEPHROLITHIASIS, HX OF 12/18/2008  . Rheumatoid arthritis ([place])  12/17/2008  . Hemorrhoids 12/15/2008  . RECTAL BLEEDING 12/15/2008  . RECTAL PAIN 12/15/2008  . Lower abdominal pain 12/15/2008    Past Surgical History:  Procedure Laterality Date  . COLONOSCOPY N/A 04/13/2016   Dr. [name]: 14 mm polyp in the ascending colon removed, diverticulosis in the distal descending colon and sigmoid colon, nonbleeding internal hemorrhoids. Pathology-tubular adenoma. Next colonoscopy 5 years.  . ELBOW SURGERY     X 3 last year. Staph infection but not MRSA per patient. infectious disease. out of work 6 months.   . TONSILLECTOMY          Home Medications    Prior to Admission medications   Medication Sig Start Date End Date Taking? Authorizing Provider  Abatacept (ORENCIA IV) Inject into the vein.    [provider]  Ascorbic Acid (VITAMIN C) 1000 MG tablet Take 1,000 mg by mouth daily.    [provider]  aspirin 81 MG tablet Take 81 mg by mouth daily.    [provider]  Calcium Carb-Cholecalciferol (CALCIUM-VITAMIN D) 600-400 MG-UNIT TABS Take 1 tablet by mouth daily.    [provider]  folic acid (FOLVITE) 1 MG tablet Take 1 mg by mouth daily.    [provider]  HYDROcodone-acetaminophen (NORCO/VICODIN) 5-325 MG tablet Take 1 tablet by mouth every 4 (four) hours as needed. 08/24/18   Noemi Chapel, MD  ibuprofen (ADVIL,MOTRIN) 600 MG tablet Take 1  tablet (600 mg total) by mouth every 6 (six) hours as needed. 08/24/18   Noemi Chapel, MD  Methotrexate Sodium (METHOTREXATE, PF,) 50 MG/2ML injection INJECT 1.0 ML WEEKLY **DISCARD EXCESS - 1 TIME USE VIAL** 09/12/17   [provider]  ondansetron (ZOFRAN) 4 MG tablet Take 1 tablet (4 mg total) by mouth every 6 (six) hours as needed for nausea. 3/88/82   Delora Fuel, MD  oxyCODONE-acetaminophen (PERCOCET) 5-325 MG tablet Take 1 tablet by mouth every 4 (four) hours as needed for moderate pain. 8/00/34   Delora Fuel, MD  predniSONE (DELTASONE) 10 MG tablet  Take 1 tablet (10 mg total) by mouth 2 (two) times daily with a meal. Take bid with food x 3 days then decrease to qd with meals 02/13/18   Lodema Pilot, PA-C  predniSONE (DELTASONE) 5 MG tablet Take 5 mg by mouth daily. 12/07/17   [provider]  tamsulosin (FLOMAX) 0.4 MG CAPS capsule Take 1 capsule (0.4 mg total) by mouth daily. 06/12/90   Delora Fuel, MD  traMADol (ULTRAM) 50 MG tablet Take 1 tablet (50 mg total) by mouth every 6 (six) hours as needed. Patient taking differently: Take 50-100 mg by mouth every 6 (six) hours as needed for moderate pain.  08/31/17   Milton Ferguson, MD  traZODone (DESYREL) 100 MG tablet Take 100 mg by mouth at bedtime as needed for sleep.    [provider]    Family History Family History  Problem Relation Age of Onset  . Heart attack Father   . Diabetes Mother   . Colon cancer Neg Hx   . Liver disease Neg Hx     Social History Social History   Tobacco Use  . Smoking status: Former Smoker    Packs/day: 1.00    Years: 39.00    Pack years: 39.00    Types: Cigarettes    Start date: 07/11/1975    Last attempt to quit: 04/26/2014    Years since quitting: 4.3  . Smokeless tobacco: Never Used  . Tobacco comment: Quit x 3 years  Substance Use Topics  . Alcohol use: No    Alcohol/week: 0.0 standard drinks  . Drug use: No     Allergies   Patient has no known allergies.   Review of Systems Review of Systems  All other systems reviewed and are negative.    Physical Exam Updated Vital Signs BP (!) 144/87   Pulse 68   Temp 97.8 F (36.6 C) (Oral)   Resp 12   SpO2 94%   Physical Exam  Constitutional: He appears well-developed and well-nourished. He appears distressed.  HENT:  Head: Normocephalic and atraumatic.  Mouth/Throat: Oropharynx is clear and moist. No oropharyngeal exudate.  Eyes: Pupils are equal, round, and reactive to light. Conjunctivae and EOM are normal. Right eye exhibits no discharge. Left eye  exhibits no discharge. No scleral icterus.  Neck: Normal range of motion. Neck supple. No JVD present. No thyromegaly present.  Cardiovascular: Normal rate, regular rhythm, normal heart sounds and intact distal pulses. Exam reveals no gallop and no friction rub.  No murmur heard. Pulmonary/Chest: Effort normal and breath sounds normal. No respiratory distress. He has no wheezes. He has no rales.  Abdominal: Soft. Bowel sounds are normal. He exhibits no distension and no mass. There is no tenderness.  Musculoskeletal: He exhibits tenderness and deformity. He exhibits no edema.  There is a right shoulder deformity, the patient has reduced mobilization of the shoulder secondary to  pain, normal grip of the right hand  Additionally there is any abrasion over the right elbow but normal range of motion and no tenderness over the bony structures of the elbow.  Lymphadenopathy:    He has no cervical adenopathy.  Neurological: He is alert. Coordination normal.  Though there is no tenderness over the elbow wrist or hand he does have pain in the shoulder with any movement of the arm on the right.  He has normal pulses, normal sensation and strength to the right hand.  Skin: Skin is warm and dry. No rash noted. No erythema.  Psychiatric: He has a normal mood and affect. His behavior is normal.  Nursing note and vitals reviewed.    ED Treatments / Results  Labs (all labs ordered are listed, but only abnormal results are displayed) Labs Reviewed - No data to display  EKG None  Radiology Dg Shoulder Right  Result Date: 08/24/2018 CLINICAL DATA:  Post reduction RIGHT ANTERIOR glenohumeral dislocation with associated humeral head fracture. EXAM: PORTABLE RIGHT SHOULDER - 2+ VIEW 3:24 p.m.: COMPARISON:  RIGHT shoulder x-rays earlier today at 2:09 p.m. FINDINGS: Anatomic alignment of the glenohumeral joint post reduction. The comminuted fracture involving the greater tuberosity of the humeral head is  again demonstrated. IMPRESSION: 1. Anatomic alignment of the glenohumeral joint post reduction. 2. Comminuted fracture involving the greater tuberosity of the humeral head. Electronically Signed   By: Evangeline Dakin M.D.   On: 08/24/2018 15:49   Dg Shoulder Right  Result Date: 08/24/2018 CLINICAL DATA:  62 year old who fell earlier today and injured the RIGHT shoulder. EXAM: RIGHT SHOULDER - 2+ VIEW COMPARISON:  08/14/2016. FINDINGS: ANTERIOR glenohumeral dislocation with associated fracture involving the humeral head. There may be a fracture of the INFERIOR glenoid as well. Acromioclavicular joint anatomically aligned with mild to moderate degenerative changes. IMPRESSION: ANTERIOR glenohumeral dislocation with associated fracture involving the humeral head and possible fracture involving the INFERIOR glenoid. Electronically Signed   By: Evangeline Dakin M.D.   On: 08/24/2018 14:27   Dg Elbow Complete Right  Result Date: 08/24/2018 CLINICAL DATA:  62 year old who fell and sustained abrasions to the RIGHT elbow. Patient sustained a fracture to the RIGHT humeral head identified on earlier RIGHT shoulder x-rays. EXAM: RIGHT ELBOW - COMPLETE 3+ VIEW COMPARISON:  08/14/2016. FINDINGS: No evidence of acute fracture or dislocation. Well-preserved joint spaces. No intrinsic osseous abnormalities. No posterior fat pad to confirm joint effusion or hemarthrosis. IMPRESSION: No acute osseous abnormality. Electronically Signed   By: Evangeline Dakin M.D.   On: 08/24/2018 15:47    Procedures .Sedation Date/Time: 08/24/2018 3:15 PM Performed by: Noemi Chapel, MD Authorized by: Noemi Chapel, MD   Consent:    Consent obtained:  Verbal   Consent given by:  Patient   Risks discussed:  Allergic reaction, dysrhythmia, inadequate sedation, nausea, prolonged hypoxia resulting in organ damage, prolonged sedation necessitating reversal, respiratory compromise necessitating ventilatory assistance and intubation and  vomiting   Alternatives discussed:  Analgesia without sedation, anxiolysis and regional anesthesia Universal protocol:    Procedure explained and questions answered to patient or proxy's satisfaction: yes     Relevant documents present and verified: yes     Test results available and properly labeled: yes     Imaging studies available: yes     Required blood products, implants, devices, and special equipment available: yes     Site/side marked: yes     Immediately prior to procedure a time out was called: yes  Patient identity confirmation method:  Verbally with patient Indications:    Procedure performed:  Dislocation reduction   Procedure necessitating sedation performed by:  Physician performing sedation Pre-sedation assessment:    Time since last food or drink:  2   ASA classification: class 2 - patient with mild systemic disease     Neck mobility: normal     Mouth opening:  3 or more finger widths   Thyromental distance:  4 finger widths   Mallampati score:  I - soft palate, uvula, fauces, pillars visible   Pre-sedation assessments completed and reviewed: airway patency (1500), cardiovascular function, hydration status, mental status, nausea/vomiting, pain level, respiratory function and temperature     Pre-sedation assessment completed:  08/24/2018 3:00 PM Immediate pre-procedure details:    Reassessment: Patient reassessed immediately prior to procedure     Reviewed: vital signs, relevant labs/tests and NPO status     Verified: bag valve mask available, emergency equipment available, intubation equipment available, IV patency confirmed, oxygen available and suction available   Procedure details (see MAR for exact dosages):    Preoxygenation:  Nasal cannula   Sedation:  Propofol   Analgesia:  Hydromorphone   Intra-procedure monitoring:  Blood pressure monitoring, cardiac monitor, continuous pulse oximetry, frequent LOC assessments, frequent vital sign checks and continuous  capnometry   Intra-procedure events: none     Total Provider sedation time (minutes):  15 Post-procedure details:    Post-sedation assessment completed:  08/24/2018 3:16 PM   Attendance: Constant attendance by certified staff until patient recovered     Recovery: Patient returned to pre-procedure baseline     Post-sedation assessments completed and reviewed: airway patency, cardiovascular function, hydration status, mental status, nausea/vomiting, pain level, respiratory function and temperature     Patient is stable for discharge or admission: yes     Patient tolerance:  Tolerated well, no immediate complications Comments:        Reduction of dislocation Date/Time: 08/24/2018 3:16 PM Performed by: Noemi Chapel, MD Authorized by: Noemi Chapel, MD  Consent: Verbal consent obtained. Risks and benefits: risks, benefits and alternatives were discussed Consent given by: patient Patient understanding: patient states understanding of the procedure being performed Patient consent: the patient's understanding of the procedure matches consent given Procedure consent: procedure consent matches procedure scheduled Relevant documents: relevant documents present and verified Test results: test results available and properly labeled Site marked: the operative site was marked Imaging studies: imaging studies available Required items: required blood products, implants, devices, and special equipment available Patient identity confirmed: verbally with patient Time out: Immediately prior to procedure a "time out" was called to verify the correct patient, procedure, equipment, support staff and site/side marked as required. Preparation: Patient was prepped and draped in the usual sterile fashion. Local anesthesia used: no  Anesthesia: Local anesthesia used: no  Sedation: Patient sedated: yes Sedatives: see MAR for details Analgesia: see MAR for details Vitals: Vital signs were monitored during  sedation.  Patient tolerance: Patient tolerated the procedure well with no immediate complications Comments: Sedation started at 3:00 PM, ended at 3:16 PM, patient returned to baseline, successful procedure with traction countertraction.  Immobilized by tech, checked after mobilization with normal neurovascular status.      (including critical care time)  Medications Ordered in ED Medications  HYDROmorphone (DILAUDID) injection 1 mg (1 mg Intravenous Given 08/24/18 1409)  propofol (DIPRIVAN) 10 mg/mL bolus/IV push (20 mg  Given by Other 08/24/18 1503)  propofol (DIPRIVAN) 10 mg/mL bolus/IV push (30 mg Intravenous  Given 08/24/18 1507)     Initial Impression / Assessment and Plan / ED Course  I have reviewed the triage vital signs and the nursing notes.  Pertinent labs & imaging results that were available during my care of the patient were reviewed by me and considered in my medical decision making (see chart for details).    The patient is ambulatory, he has no pain in his legs, he has no pain in his left upper extremity, his head is atraumatic.  We will perform an x-ray to evaluate for shoulder dislocation versus fracture.  He will be given some pain medicine as well as sedation to help reduce the shoulder  Postreduction x-rays reviewed, shows no signs of dislocation, it has been adequately reduced, there is a greater tuberosity fracture of the right shoulder, the patient was informed, placed in a sling and shoulder immobilizer and will be discharged home with medications.  Both he and his wife expressed her understanding.  Dr. Ninfa Linden is on-call and the patient will be referred to his office.  Final Clinical Impressions(s) / ED Diagnoses   Final diagnoses:  Shoulder dislocation, right, initial encounter  Closed displaced fracture of greater tuberosity of right humerus, initial encounter    ED Discharge Orders         Ordered    ibuprofen (ADVIL,MOTRIN) 600 MG tablet  Every 6  hours PRN     08/24/18 1630    HYDROcodone-acetaminophen (NORCO/VICODIN) 5-325 MG tablet  Every 4 hours PRN     08/24/18 1630           Noemi Chapel, MD 08/24/18 [phone]

## 2018-08-24 NOTE — Sedation Documentation (Signed)
Vital signs stable. 

## 2018-08-24 NOTE — Discharge Instructions (Addendum)
Please use the sling exactly as we have applied it, you should not take this off until you have followed up with the orthopedic doctor within the next several days.  Ibuprofen 3 times a day, hydrocodone only for severe pain.  If you do use hydrocodone make sure that you take a stool softener as well.  Return to the emergency department for severe or worsening pain numbness or weakness

## 2018-08-24 NOTE — ED Notes (Addendum)
Pt placed on cardiac monitor. Color coming back to face. Pillows placed for comfort.

## 2018-08-24 NOTE — Sedation Documentation (Signed)
Dr Sabra Heck attempting to relocation at this time. Some moaning with pt.

## 2018-08-24 NOTE — Sedation Documentation (Signed)
Shoulder in place, elbow dressed and shoulder immobilizer in place.

## 2018-08-29 ENCOUNTER — Ambulatory Visit (INDEPENDENT_AMBULATORY_CARE_PROVIDER_SITE_OTHER): Payer: Self-pay | Admitting: Orthopaedic Surgery

## 2018-08-29 ENCOUNTER — Ambulatory Visit (INDEPENDENT_AMBULATORY_CARE_PROVIDER_SITE_OTHER): Payer: Self-pay

## 2018-08-29 ENCOUNTER — Encounter (INDEPENDENT_AMBULATORY_CARE_PROVIDER_SITE_OTHER): Payer: Self-pay | Admitting: Orthopaedic Surgery

## 2018-08-29 DIAGNOSIS — M25511 Pain in right shoulder: Secondary | ICD-10-CM

## 2018-08-29 DIAGNOSIS — S42251A Displaced fracture of greater tuberosity of right humerus, initial encounter for closed fracture: Secondary | ICD-10-CM | POA: Insufficient documentation

## 2018-08-29 DIAGNOSIS — S43014A Anterior dislocation of right humerus, initial encounter: Secondary | ICD-10-CM

## 2018-08-29 NOTE — Progress Notes (Signed)
Office Visit Note   Patient: Ronald Cameron           Date of Birth: Dec 17, 1955           MRN: [id number] Visit Date: 08/29/2018              Requested by: Claretta Fraise, MD [place], [place] [address] PCP: Claretta Fraise, MD   Assessment & Plan: Visit Diagnoses:  1. Acute pain of right shoulder   2. Anterior dislocation of right shoulder, initial encounter   3. Displaced fracture of greater tuberosity of right humerus, initial encounter for closed fracture     Plan: He understands that he needs avoid external rotation and abduction of that shoulder on the right side until further notice.  He will stay in the sling except for showers and occasionally coming out of the sling to move his elbow wrist and hand.  I would like to see him back in just 1 week with a single AP view of the right shoulder to confirm that the greater tuberosity piece is staying where it is so we can avoid surgery.  He understands this fully.  All question concerns were answered and addressed.  We will see him next week.  Follow-Up Instructions: Return in about 1 week (around 09/05/2018).   Orders:  Orders Placed This Encounter  Procedures  . XR Shoulder 1V Right   No orders of the defined types were placed in this encounter.     Procedures: No procedures performed   Clinical Data: No additional findings.   Subjective: Chief Complaint  Patient presents with  . Right Shoulder - Injury  The patient is referred from the emergency room at [facility] after sustaining mechanical fall on 08/24/2018 and dislocating his right shoulder.  He also sustained a greater tuberosity fracture.  They performed a closed reduction under sedation in the emergency room and placed him in a sling.  He has been compliant with keeping the sling on since that visit.  He is never injured this right shoulder before.  He denies any numbness and tingling or weakness in his hand.  He is 62 years old and on disability.   He is not a diabetic.  HPI  Review of Systems He currently denies any headache, chest pain, shortness of breath, fever, chills, nausea, vomiting.  Objective: Vital Signs: There were no vitals taken for this visit.  Physical Exam He is alert and oriented x3 and in no acute distress Ortho Exam Examination of his right shoulder show this clinically located.  I did not put him through any aggressive range of motion.  His axillary nerve function.  His elbow wrist and hand exam are normal on the right side. Specialty Comments:  No specialty comments available.  Imaging: Xr Shoulder 1v Right  Result Date: 08/29/2018 A single AP view of the right shoulder shows the glenohumeral joint is reduced and located.  There is a nondisplaced greater tuberosity fracture.    PMFS History: Patient Active Problem List   Diagnosis Date Noted  . Displaced fracture of greater tuberosity of right humerus, initial encounter for closed fracture 08/29/2018  . Anterior dislocation of right shoulder 08/29/2018  . Enteritis   . Generalized abdominal pain   . Immunosuppressed status ([place]) 09/02/2017  . Morbid obesity with body mass index of 40.0-49.9 ([place]) 09/02/2017  . Aortic atherosclerosis ([place]) 09/01/2017  . Acute bilateral lower abdominal pain 08/31/2017  . Diarrhea 11/17/2016  . Abnormal hepatitis serology 11/17/2016  .  Renal lesion 05/06/2016  . History of colonic polyps   . Diverticulosis of colon without hemorrhage   . Bloating 04/08/2016  . Encounter for screening colonoscopy 04/08/2016  . Colitis 04/06/2015  . Fatty liver 04/06/2015  . Early satiety 02/20/2015  . Abdominal pain, epigastric 02/20/2015  . Abdominal swelling, generalized 02/20/2015  . High risk medication use 02/20/2015  . Septic olecranon bursitis of right elbow 12/16/2013  . Pain in elbow 12/16/2013  . EXTERNAL HEMORRHOIDS 12/18/2008  . GERD 12/18/2008  . NEPHROLITHIASIS, HX OF 12/18/2008  . Rheumatoid arthritis ([place])  12/17/2008  . Hemorrhoids 12/15/2008  . RECTAL BLEEDING 12/15/2008  . RECTAL PAIN 12/15/2008  . Lower abdominal pain 12/15/2008   Past Medical History:  Diagnosis Date  . Arthritis   . Bowel obstruction ([place])   . Collagen vascular disease (HCC)    Rheumatoid Arthritis  . Colon polyps   . Diverticulosis   . Kidney stones   . Rheumatoid arthritis ([place])     Family History  Problem Relation Age of Onset  . Heart attack Father   . Diabetes Mother   . Colon cancer Neg Hx   . Liver disease Neg Hx     Past Surgical History:  Procedure Laterality Date  . COLONOSCOPY N/A 04/13/2016   Dr. [name]: 14 mm polyp in the ascending colon removed, diverticulosis in the distal descending colon and sigmoid colon, nonbleeding internal hemorrhoids. Pathology-tubular adenoma. Next colonoscopy 5 years.  . ELBOW SURGERY     X 3 last year. Staph infection but not MRSA per patient. infectious disease. out of work 6 months.   . TONSILLECTOMY     Social History   Occupational History  . Occupation: Horticulturist, commercial  Tobacco Use  . Smoking status: Former Smoker    Packs/day: 1.00    Years: 39.00    Pack years: 39.00    Types: Cigarettes    Start date: 07/11/1975    Last attempt to quit: 04/26/2014    Years since quitting: 4.3  . Smokeless tobacco: Never Used  . Tobacco comment: Quit x 3 years  Substance and Sexual Activity  . Alcohol use: No    Alcohol/week: 0.0 standard drinks  . Drug use: No  . Sexual activity: Not on file

## 2018-09-06 ENCOUNTER — Ambulatory Visit (INDEPENDENT_AMBULATORY_CARE_PROVIDER_SITE_OTHER): Payer: Self-pay

## 2018-09-06 ENCOUNTER — Encounter (INDEPENDENT_AMBULATORY_CARE_PROVIDER_SITE_OTHER): Payer: Self-pay | Admitting: Orthopaedic Surgery

## 2018-09-06 ENCOUNTER — Ambulatory Visit (INDEPENDENT_AMBULATORY_CARE_PROVIDER_SITE_OTHER): Payer: Self-pay | Admitting: Orthopaedic Surgery

## 2018-09-06 DIAGNOSIS — S42251A Displaced fracture of greater tuberosity of right humerus, initial encounter for closed fracture: Secondary | ICD-10-CM

## 2018-09-06 DIAGNOSIS — M25511 Pain in right shoulder: Secondary | ICD-10-CM

## 2018-09-06 DIAGNOSIS — S43014A Anterior dislocation of right humerus, initial encounter: Secondary | ICD-10-CM

## 2018-09-06 NOTE — Progress Notes (Signed)
The patient is now 2 weeks status post a mechanical fall in which she sustained a right shoulder dislocation and a fracture of the greater tuberosity.  This was reduced by the ER staff.  We saw him last week and the fracture was anatomically reduced as well.  We kept him in a sling and want to see him back this week to make sure that the fracture was remaining nondisplaced.  He states that he is having some pain but overall is been compliant with wearing a sling.  He says his shoulder is weak.  On exam it is well located in terms of examination of the right shoulder.  Distally his motor and sensory exam is normal.  I did not try to have him abduct his shoulder given the fracture being still acute.  A single view of the right shoulder does show the greater tuberosity piece remains nondisplaced.  The shoulder is well located on this view.  At this point will still have him continue the sling.  He will still avoid abduction and any types of attempts at overhead activity or reaching behind him with his right arm.  We will see him back in 4 weeks and at that visit I would like 3 views of the right shoulder.  All question concerns were answered and addressed.

## 2018-10-04 ENCOUNTER — Ambulatory Visit (INDEPENDENT_AMBULATORY_CARE_PROVIDER_SITE_OTHER): Payer: Self-pay | Admitting: Orthopaedic Surgery

## 2018-10-04 ENCOUNTER — Ambulatory Visit (INDEPENDENT_AMBULATORY_CARE_PROVIDER_SITE_OTHER): Payer: Self-pay

## 2018-10-04 ENCOUNTER — Encounter (INDEPENDENT_AMBULATORY_CARE_PROVIDER_SITE_OTHER): Payer: Self-pay | Admitting: Orthopaedic Surgery

## 2018-10-04 DIAGNOSIS — S42251A Displaced fracture of greater tuberosity of right humerus, initial encounter for closed fracture: Secondary | ICD-10-CM

## 2018-10-04 DIAGNOSIS — S43014A Anterior dislocation of right humerus, initial encounter: Secondary | ICD-10-CM

## 2018-10-04 NOTE — Progress Notes (Signed)
   The patient is about 6 weeks status post a right shoulder dislocation with a greater tuberosity and proximal humerus fracture.  He is been in a sling and doing well.  This fracture is nondisplaced.  He has been compliant with being in a sling.  On examination the sling the shoulder is quite stiff and weak.  He does move his unit and is clinically well located.  2 views of the right shoulder show that it is well located in the fracture lines are visible but remain nondisplaced.  This point he can be out of the sling except when he sleeps at night.  He is going work on [employer].  I would like to see him back in 4 weeks.  At that visit we may consider starting physical therapy.  At the next visit I would like an AP and axillary view of the right shoulder.

## 2018-11-01 ENCOUNTER — Ambulatory Visit (INDEPENDENT_AMBULATORY_CARE_PROVIDER_SITE_OTHER): Payer: Self-pay

## 2018-11-01 ENCOUNTER — Ambulatory Visit (INDEPENDENT_AMBULATORY_CARE_PROVIDER_SITE_OTHER): Payer: Self-pay | Admitting: Orthopaedic Surgery

## 2018-11-01 ENCOUNTER — Encounter (INDEPENDENT_AMBULATORY_CARE_PROVIDER_SITE_OTHER): Payer: Self-pay | Admitting: Orthopaedic Surgery

## 2018-11-01 DIAGNOSIS — S43014D Anterior dislocation of right humerus, subsequent encounter: Secondary | ICD-10-CM

## 2018-11-01 NOTE — Progress Notes (Signed)
The patient is now 9 weeks status post a traumatic fall in which she sustained an anterior shoulder dislocation of the right shoulder.  He also sustained a nondisplaced proximal humerus fracture.  This also involves the greater tuberosity.  He has been having a hard time getting around but is making progress working on motion of the shoulder.  We have not put him through any therapy.  On exam today he does have weakness of shoulder abduction but he can abduct 90 degrees it is very hard to do it.  His mobility is improving overall and the shoulder is clinically well located.  3 views AP, outlet and axillary views of the right shoulder show a well located shoulder.  There is been interval healing of his nondisplaced proximal humerus fracture.  The greater tuberosity piece is healed.  At this standpoint he will continue increase his activities as comfort allows.  I want him out of the sling at all times.  I did offer him outpatient physical therapy but he says he is unable to go so I showed him a lot of exercises I need him to try on his own.  I do not need to see him back for about 3 months.  At that visit I would just like a single AP view of the right shoulder.

## 2019-01-30 ENCOUNTER — Ambulatory Visit: Payer: Self-pay | Admitting: Orthopaedic Surgery

## 2019-02-14 ENCOUNTER — Telehealth: Payer: Self-pay | Admitting: Family Medicine

## 2019-06-12 DIAGNOSIS — M130 Polyarthritis, unspecified: Secondary | ICD-10-CM | POA: Diagnosis not present

## 2019-06-12 DIAGNOSIS — L409 Psoriasis, unspecified: Secondary | ICD-10-CM | POA: Diagnosis not present

## 2019-06-12 DIAGNOSIS — M25511 Pain in right shoulder: Secondary | ICD-10-CM | POA: Diagnosis not present

## 2019-06-12 DIAGNOSIS — Z79899 Other long term (current) drug therapy: Secondary | ICD-10-CM | POA: Diagnosis not present

## 2019-06-12 DIAGNOSIS — M1990 Unspecified osteoarthritis, unspecified site: Secondary | ICD-10-CM | POA: Diagnosis not present

## 2019-06-12 DIAGNOSIS — M0579 Rheumatoid arthritis with rheumatoid factor of multiple sites without organ or systems involvement: Secondary | ICD-10-CM | POA: Diagnosis not present

## 2019-07-11 DIAGNOSIS — S61211A Laceration without foreign body of left index finger without damage to nail, initial encounter: Secondary | ICD-10-CM | POA: Diagnosis not present

## 2019-08-14 DIAGNOSIS — H2513 Age-related nuclear cataract, bilateral: Secondary | ICD-10-CM | POA: Diagnosis not present

## 2019-09-06 DIAGNOSIS — H25043 Posterior subcapsular polar age-related cataract, bilateral: Secondary | ICD-10-CM | POA: Diagnosis not present

## 2019-09-06 DIAGNOSIS — H2511 Age-related nuclear cataract, right eye: Secondary | ICD-10-CM | POA: Diagnosis not present

## 2019-09-06 DIAGNOSIS — H18413 Arcus senilis, bilateral: Secondary | ICD-10-CM | POA: Diagnosis not present

## 2019-09-06 DIAGNOSIS — H25013 Cortical age-related cataract, bilateral: Secondary | ICD-10-CM | POA: Diagnosis not present

## 2019-09-06 DIAGNOSIS — H2513 Age-related nuclear cataract, bilateral: Secondary | ICD-10-CM | POA: Diagnosis not present

## 2019-09-11 DIAGNOSIS — M25572 Pain in left ankle and joints of left foot: Secondary | ICD-10-CM | POA: Diagnosis not present

## 2019-09-11 DIAGNOSIS — M79671 Pain in right foot: Secondary | ICD-10-CM | POA: Diagnosis not present

## 2019-09-11 DIAGNOSIS — M79672 Pain in left foot: Secondary | ICD-10-CM | POA: Diagnosis not present

## 2019-09-11 DIAGNOSIS — M25511 Pain in right shoulder: Secondary | ICD-10-CM | POA: Diagnosis not present

## 2019-09-11 DIAGNOSIS — M810 Age-related osteoporosis without current pathological fracture: Secondary | ICD-10-CM | POA: Diagnosis not present

## 2019-09-11 DIAGNOSIS — M25579 Pain in unspecified ankle and joints of unspecified foot: Secondary | ICD-10-CM | POA: Diagnosis not present

## 2019-09-11 DIAGNOSIS — M1990 Unspecified osteoarthritis, unspecified site: Secondary | ICD-10-CM | POA: Diagnosis not present

## 2019-09-11 DIAGNOSIS — M130 Polyarthritis, unspecified: Secondary | ICD-10-CM | POA: Diagnosis not present

## 2019-09-11 DIAGNOSIS — Z79899 Other long term (current) drug therapy: Secondary | ICD-10-CM | POA: Diagnosis not present

## 2019-09-11 DIAGNOSIS — M0579 Rheumatoid arthritis with rheumatoid factor of multiple sites without organ or systems involvement: Secondary | ICD-10-CM | POA: Diagnosis not present

## 2019-09-11 DIAGNOSIS — M25571 Pain in right ankle and joints of right foot: Secondary | ICD-10-CM | POA: Diagnosis not present

## 2019-09-11 DIAGNOSIS — L409 Psoriasis, unspecified: Secondary | ICD-10-CM | POA: Diagnosis not present

## 2019-09-30 DIAGNOSIS — H2511 Age-related nuclear cataract, right eye: Secondary | ICD-10-CM | POA: Diagnosis not present

## 2019-10-01 DIAGNOSIS — H2513 Age-related nuclear cataract, bilateral: Secondary | ICD-10-CM | POA: Diagnosis not present

## 2019-10-01 DIAGNOSIS — H2512 Age-related nuclear cataract, left eye: Secondary | ICD-10-CM | POA: Diagnosis not present

## 2019-10-21 DIAGNOSIS — H2512 Age-related nuclear cataract, left eye: Secondary | ICD-10-CM | POA: Diagnosis not present

## 2019-10-21 DIAGNOSIS — H2513 Age-related nuclear cataract, bilateral: Secondary | ICD-10-CM | POA: Diagnosis not present

## 2019-12-10 DIAGNOSIS — M0579 Rheumatoid arthritis with rheumatoid factor of multiple sites without organ or systems involvement: Secondary | ICD-10-CM | POA: Diagnosis not present

## 2019-12-10 DIAGNOSIS — M810 Age-related osteoporosis without current pathological fracture: Secondary | ICD-10-CM | POA: Diagnosis not present

## 2019-12-10 DIAGNOSIS — M79643 Pain in unspecified hand: Secondary | ICD-10-CM | POA: Diagnosis not present

## 2019-12-10 DIAGNOSIS — L409 Psoriasis, unspecified: Secondary | ICD-10-CM | POA: Diagnosis not present

## 2019-12-10 DIAGNOSIS — M25579 Pain in unspecified ankle and joints of unspecified foot: Secondary | ICD-10-CM | POA: Diagnosis not present

## 2019-12-10 DIAGNOSIS — Z79899 Other long term (current) drug therapy: Secondary | ICD-10-CM | POA: Diagnosis not present

## 2019-12-10 DIAGNOSIS — M1990 Unspecified osteoarthritis, unspecified site: Secondary | ICD-10-CM | POA: Diagnosis not present

## 2019-12-18 ENCOUNTER — Ambulatory Visit (INDEPENDENT_AMBULATORY_CARE_PROVIDER_SITE_OTHER): Payer: PPO | Admitting: *Deleted

## 2019-12-18 VITALS — Ht 71.0 in | Wt 235.0 lb

## 2019-12-18 DIAGNOSIS — Z0000 Encounter for general adult medical examination without abnormal findings: Secondary | ICD-10-CM | POA: Diagnosis not present

## 2019-12-18 NOTE — Progress Notes (Signed)
MEDICARE ANNUAL WELLNESS VISIT  12/18/2019  Telephone Visit Disclaimer This Medicare AWV was conducted by telephone due to national recommendations for restrictions regarding the COVID-19 Pandemic (e.g. social distancing).  I verified, using two identifiers, that I am speaking with Ronald Cameron or their authorized healthcare agent. I discussed the limitations, risks, security, and privacy concerns of performing an evaluation and management service by telephone and the potential availability of an in-person appointment in the future. The patient expressed understanding and agreed to proceed.   Subjective:  Ronald Cameron is a 64 y.o. male patient of Ronald Cameron, Ronald Gash, MD who had a Medicare Annual Wellness Visit today via telephone. Ronald Cameron is Disabled and lives with their spouse. He has 0 children. He reports that he is socially active and does interact with friends/family regularly. He is not physically active and enjoys playing video games.  Patient Care Team: Ronald Fraise, MD as PCP - General (Family Medicine) Ronald Dolin, MD as Consulting Physician (Gastroenterology)  Advanced Directives 12/18/2019 08/24/2018 09/02/2017 08/31/2017 12/04/2016 08/14/2016 04/13/2016  Does Patient Have a Medical Advance Directive? No No No No No No No  Would patient like information on creating a medical advance directive? No - Patient declined - No - Patient declined - No - Patient declined No - patient declined information No - patient declined information    Hospital Utilization Over the Past 12 Months: # of hospitalizations or ER visits: 0 # of surgeries: 1  Review of Systems    Patient reports that his overall health is unchanged compared to last year.  History obtained from chart review and the patient  Patient Reported Readings (BP, Pulse, CBG, Weight, etc) none  Pain Assessment       Current Medications & Allergies (verified) Allergies as of 12/18/2019   No Known  Allergies     Medication List       Accurate as of December 18, 2019 11:04 AM. If you have any questions, ask your nurse or doctor.        STOP taking these medications   HYDROcodone-acetaminophen 5-325 MG tablet Commonly known as: NORCO/VICODIN   ibuprofen 600 MG tablet Commonly known as: ADVIL   ondansetron 4 MG tablet Commonly known as: ZOFRAN   ORENCIA IV   oxyCODONE-acetaminophen 5-325 MG tablet Commonly known as: Percocet     TAKE these medications   alendronate 70 MG tablet Commonly known as: FOSAMAX PLEASE SEE ATTACHED FOR DETAILED DIRECTIONS   aspirin 81 MG tablet Take 81 mg by mouth daily.   Calcium-Vitamin D 600-400 MG-UNIT Tabs Take 1 tablet by mouth daily. 1200mg  daily   folic acid 1 MG tablet Commonly known as: FOLVITE Take 1 mg by mouth daily.   methotrexate (PF) 50 MG/2ML injection INJECT 1.0 ML WEEKLY **DISCARD EXCESS - 1 TIME USE VIAL**   predniSONE 5 MG tablet Commonly known as: DELTASONE Take 5 mg by mouth daily. Take 10mg  every other day alternating with 5mg . What changed: Another medication with the same name was removed. Continue taking this medication, and follow the directions you see here.   tamsulosin 0.4 MG Caps capsule Commonly known as: FLOMAX Take 1 capsule (0.4 mg total) by mouth daily.   traMADol 50 MG tablet Commonly known as: ULTRAM Take 1 tablet (50 mg total) by mouth every 6 (six) hours as needed. What changed:   how much to take  reasons to take this   traZODone 50 MG tablet Commonly known as: DESYREL Take 50 mg  by mouth at bedtime. What changed: Another medication with the same name was removed. Continue taking this medication, and follow the directions you see here.   vitamin C 1000 MG tablet Take 1,000 mg by mouth daily.   Xeljanz XR 11 MG Tb24 Generic drug: Tofacitinib Citrate ER Take 11 mg by mouth daily.       History (reviewed): Past Medical History:  Diagnosis Date  . Arthritis   . Bowel  obstruction ([place])   . Cataract   . Collagen vascular disease ([facility])    Rheumatoid Arthritis  . Colon polyps   . Diverticulosis   . Kidney stones   . Rheumatoid arthritis [facility])    Past Surgical History:  Procedure Laterality Date  . CATARACT EXTRACTION, BILATERAL    . COLONOSCOPY N/A 04/13/2016   Dr. Gala Cameron: 14 mm polyp in the ascending colon removed, diverticulosis in the distal descending colon and sigmoid colon, nonbleeding internal hemorrhoids. Pathology-tubular adenoma. Next colonoscopy 5 years.  . ELBOW SURGERY     X 3 last year. Staph infection but not MRSA per patient. infectious disease. out of work 6 months.   . TONSILLECTOMY     Family History  Problem Relation Age of Onset  . Heart attack Father   . Heart disease Father   . Diabetes Mother   . Congestive Heart Failure Mother   . Heart disease Sister   . Heart disease Brother   . Heart disease Sister   . Colon cancer Neg Hx   . Liver disease Neg Hx    Social History   Socioeconomic History  . Marital status: Married    Spouse name: Not on file  . Number of children: 0  . Years of education: Not on file  . Highest education level: 12th grade  Occupational History  . Occupation: Disbility  Tobacco Use  . Smoking status: Former Smoker    Packs/day: 1.00    Years: 39.00    Pack years: 39.00    Types: Cigarettes    Start date: 07/11/1975    Quit date: 04/26/2014    Years since quitting: 5.6  . Smokeless tobacco: Never Used  . Tobacco comment: Quit x 3 years  Substance and Sexual Activity  . Alcohol use: No    Alcohol/week: 0.0 standard drinks  . Drug use: No  . Sexual activity: Not Currently  Other Topics Concern  . Not on file  Social History Narrative  . Not on file   Social Determinants of Health   Financial Resource Strain:   . Difficulty of Paying Living Expenses:   Food Insecurity:   . Worried About Charity fundraiser in the Last Year:   . Arboriculturist in the Last Year:   Transportation  Needs:   . Film/video editor (Medical):   Marland Kitchen Lack of Transportation (Non-Medical):   Physical Activity:   . Days of Exercise per Week:   . Minutes of Exercise per Session:   Stress:   . Feeling of Stress :   Social Connections:   . Frequency of Communication with Friends and Family:   . Frequency of Social Gatherings with Friends and Family:   . Attends Religious Services:   . Active Member of Clubs or Organizations:   . Attends Archivist Meetings:   Marland Kitchen Marital Status:     Activities of Daily Living In your present state of health, do you have any difficulty performing the following activities: 12/18/2019  Hearing? N  Vision? N  Comment Wears reading glasses  Difficulty concentrating or making decisions? Y  Comment remembering  Walking or climbing stairs? Y  Comment all the time due to RA  Dressing or bathing? N  Doing errands, shopping? N  Preparing Food and eating ? N  Using the Toilet? N  In the past six months, have you accidently leaked urine? N  Do you have problems with loss of bowel control? N  Managing your Medications? N  Managing your Finances? N  Housekeeping or managing your Housekeeping? N  Some recent data might be hidden    Patient Education/ Literacy How often do you need to have someone help you when you read instructions, pamphlets, or other written materials from your doctor or pharmacy?: 1 - Never What is the last grade level you completed in school?: 12th  Exercise Current Exercise Habits: The patient does not participate in regular exercise at present, Exercise limited by: orthopedic condition(s)  Diet Patient reports consuming 2 meals a day and 1 snack(s) a day Patient reports that his primary diet is: Regular Patient reports that she does have regular access to food.   Depression Screen PHQ 2/9 Scores 12/18/2019 02/13/2018 10/20/2017 09/22/2017 09/01/2017 07/18/2016 05/18/2016  PHQ - 2 Score 0 0 0 0 0 0 0     Fall Risk Fall Risk   12/18/2019 02/13/2018 10/20/2017 09/22/2017 09/01/2017  Falls in the past year? 0 No No No No     Objective:  Ronald Cameron seemed alert and oriented and he participated appropriately during our telephone visit.  Blood Pressure Weight BMI  BP Readings from Last 3 Encounters:  08/24/18 123/79  02/13/18 132/78  02/04/18 114/73   Wt Readings from Last 3 Encounters:  12/18/19 235 lb (106.6 kg)  02/13/18 217 lb 4 oz (98.5 kg)  02/03/18 218 lb (98.9 kg)   BMI Readings from Last 1 Encounters:  12/18/19 32.78 kg/m    *Unable to obtain current vital signs, weight, and BMI due to telephone visit type  Hearing/Vision  . Kairi did not seem to have difficulty with hearing/understanding during the telephone conversation . Reports that he has had a formal eye exam by an eye care professional within the past year . Reports that he has not had a formal hearing evaluation within the past year *Unable to fully assess hearing and vision during telephone visit type  Cognitive Function: 6CIT Screen 12/18/2019  What Year? 0 points  What month? 0 points  What time? 0 points  Count back from 20 0 points  Months in reverse 0 points  Repeat phrase 0 points  Total Score 0   (Normal:0-7, Significant for Dysfunction: >8)  Normal Cognitive Function Screening: Yes   Immunization & Health Maintenance Record  There is no immunization history on file for this patient.  Health Maintenance  Topic Date Due  . Hepatitis C Screening  Never done  . TETANUS/TDAP  Never done  . INFLUENZA VACCINE  Never done  . COLONOSCOPY  04/13/2026  . HIV Screening  Completed       Assessment  This is a routine wellness examination for [employer].  Health Maintenance: Due or Overdue Health Maintenance Due  Topic Date Due  . Hepatitis C Screening  Never done  . TETANUS/TDAP  Never done  . INFLUENZA VACCINE  Never done    Ronald Cameron does not need a referral for Community  Assistance: Care Management:   no Social Work:    no  Prescription Assistance:  no Nutrition/Diabetes Education:  no   Plan:  Personalized Goals Goals Addressed   None    Personalized Health Maintenance & Screening Recommendations  Td vaccine Hep C  Patient states he has had Td and will get [place] a date Lung Cancer Screening Recommended: yes (Low Dose CT Chest recommended if Age 65-80 years, 30 pack-year currently smoking OR have quit w/in past 15 years) Hepatitis C Screening recommended: no HIV Screening recommended: no  Advanced Directives: Written information was not prepared per patient's request.  Referrals & Orders No orders of the defined types were placed in this encounter.   Follow-up Plan . Follow-up with Ronald Fraise, MD as planned-  . Please call and get the date of tetanus . Discuss low dose Ct scan with Dr. Livia Snellen at your next appointment.    I have personally reviewed and noted the following in the patient's chart:   . Medical and social history . Use of alcohol, tobacco or illicit drugs  . Current medications and supplements . Functional ability and status . Nutritional status . Physical activity . Advanced directives . List of other physicians . Hospitalizations, surgeries, and ER visits in previous 12 months . Vitals . Screenings to include cognitive, depression, and falls . Referrals and appointments  In addition, I have reviewed and discussed with Ronald Cameron certain preventive protocols, quality metrics, and best practice recommendations. A written personalized care plan for preventive services as well as general preventive health recommendations is available and can be mailed to the patient at his request.      Lynnea Ferrier, LPN  [phone]

## 2020-03-11 DIAGNOSIS — M79643 Pain in unspecified hand: Secondary | ICD-10-CM | POA: Diagnosis not present

## 2020-03-11 DIAGNOSIS — L409 Psoriasis, unspecified: Secondary | ICD-10-CM | POA: Diagnosis not present

## 2020-03-11 DIAGNOSIS — M0579 Rheumatoid arthritis with rheumatoid factor of multiple sites without organ or systems involvement: Secondary | ICD-10-CM | POA: Diagnosis not present

## 2020-03-11 DIAGNOSIS — M1990 Unspecified osteoarthritis, unspecified site: Secondary | ICD-10-CM | POA: Diagnosis not present

## 2020-03-11 DIAGNOSIS — Z79899 Other long term (current) drug therapy: Secondary | ICD-10-CM | POA: Diagnosis not present

## 2020-03-11 DIAGNOSIS — M25579 Pain in unspecified ankle and joints of unspecified foot: Secondary | ICD-10-CM | POA: Diagnosis not present

## 2020-03-11 DIAGNOSIS — M810 Age-related osteoporosis without current pathological fracture: Secondary | ICD-10-CM | POA: Diagnosis not present

## 2020-03-12 ENCOUNTER — Ambulatory Visit: Payer: Self-pay

## 2020-03-12 ENCOUNTER — Ambulatory Visit (INDEPENDENT_AMBULATORY_CARE_PROVIDER_SITE_OTHER): Payer: PPO | Admitting: Family

## 2020-03-12 ENCOUNTER — Other Ambulatory Visit: Payer: PPO

## 2020-03-12 ENCOUNTER — Encounter: Payer: Self-pay | Admitting: Family

## 2020-03-12 ENCOUNTER — Other Ambulatory Visit: Payer: Self-pay

## 2020-03-12 ENCOUNTER — Ambulatory Visit (INDEPENDENT_AMBULATORY_CARE_PROVIDER_SITE_OTHER): Payer: PPO

## 2020-03-12 DIAGNOSIS — R1033 Periumbilical pain: Secondary | ICD-10-CM

## 2020-03-12 DIAGNOSIS — K5792 Diverticulitis of intestine, part unspecified, without perforation or abscess without bleeding: Secondary | ICD-10-CM | POA: Diagnosis not present

## 2020-03-12 DIAGNOSIS — K5730 Diverticulosis of large intestine without perforation or abscess without bleeding: Secondary | ICD-10-CM

## 2020-03-12 DIAGNOSIS — R112 Nausea with vomiting, unspecified: Secondary | ICD-10-CM

## 2020-03-12 DIAGNOSIS — R109 Unspecified abdominal pain: Secondary | ICD-10-CM | POA: Diagnosis not present

## 2020-03-12 MED ORDER — CIPROFLOXACIN HCL 500 MG PO TABS: 500.0000 mg | ORAL_TABLET | Freq: Two times a day (BID) | ORAL | 0 refills | Status: DC

## 2020-03-12 MED ORDER — METRONIDAZOLE 500 MG PO TABS: 500.0000 mg | ORAL_TABLET | Freq: Three times a day (TID) | ORAL | 0 refills | Status: DC

## 2020-03-12 NOTE — Addendum Note (Signed)
Addended by: Lanier Prude D on: 03/12/2020 01:55 PM   Modules accepted: Orders

## 2020-03-12 NOTE — Progress Notes (Signed)
Virtual Visit via telephone Note Due to COVID-19 pandemic this visit was conducted virtually. This visit type was conducted due to national recommendations for restrictions regarding the COVID-19 Pandemic (e.g. social distancing, sheltering in place) in an effort to limit this patient's exposure and mitigate transmission in our community. All issues noted in this document were discussed and addressed.  A physical exam was not performed with this format.  I connected with Ronald Cameron on 03/12/20 at 12:48 pm by telephone and verified that I am speaking with the correct person using two identifiers. Ronald Cameron is currently located at home and wife is currently with him during visit. The provider, Evelina Dun, FNP is located in their office at time of visit.  I discussed the limitations, risks, security and privacy concerns of performing an evaluation and management service by telephone and the availability of in person appointments. I also discussed with the patient that there may be a patient responsible charge related to this service. The patient expressed understanding and agreed to proceed.   History and Present Illness:  Pt calls the office today with nausea and abdominal pain that started Monday evening. He reports he has diverticulitis and feels similar to this. He reports the pain comes and goes, but has constant nausea. Abdominal Pain This is a new problem. The current episode started in the past 7 days. The onset quality is gradual. The problem occurs intermittently. The problem has been waxing and waning. The pain is located in the periumbilical region. The pain is at a severity of 7/10. The pain is moderate. Associated symptoms include nausea and vomiting. Pertinent negatives include no belching, constipation, diarrhea, fever, frequency, hematochezia or hematuria. Nothing aggravates the pain. The pain is relieved by nothing. He has tried antacids for the symptoms. The  treatment provided mild relief.       Review of Systems  Constitutional: Negative for fever.  Gastrointestinal: Positive for abdominal pain, nausea and vomiting. Negative for constipation, diarrhea and hematochezia.  Genitourinary: Negative for frequency and hematuria.     Observations/Objective: No SOB or distress noted   Assessment and Plan: 1. Periumbilical abdominal pain - DG Abd 1 View; Future  2. Nausea and vomiting, intractability of vomiting not specified, unspecified vomiting type - DG Abd 1 View; Future  3. Diverticulitis  - metroNIDAZOLE (FLAGYL) 500 MG tablet; Take 1 tablet (500 mg total) by mouth 3 (three) times daily.  Dispense: 21 tablet; Refill: 0 - ciprofloxacin (CIPRO) 500 MG tablet; Take 1 tablet (500 mg total) by mouth 2 (two) times daily.  Dispense: 14 tablet; Refill: 0 - DG Abd 1 View; Future  Pt will come and get KUB today Had labs completed yesterday at Rheumatologists office Start antibiotics  Discussed if abdominal pain worsens go to ED Call if develops fever    I discussed the assessment and treatment plan with the patient. The patient was provided an opportunity to ask questions and all were answered. The patient agreed with the plan and demonstrated an understanding of the instructions.   The patient was advised to call back or seek an in-person evaluation if the symptoms worsen or if the condition fails to improve as anticipated.  The above assessment and management plan was discussed with the patient. The patient verbalized understanding of and has agreed to the management plan. Patient is aware to call the clinic if symptoms persist or worsen. Patient is aware when to return to the clinic for a follow-up visit. Patient educated on  when it is appropriate to go to the emergency department.   Time call ended:  1:05 pm  I provided 17 minutes of non-face-to-face time during this encounter.    Evelina Dun, FNP

## 2020-03-12 NOTE — Addendum Note (Signed)
Addended by: Lanier Prude D on: 03/12/2020 01:48 PM   Modules accepted: Orders

## 2020-03-13 ENCOUNTER — Ambulatory Visit: Payer: PPO | Admitting: Family

## 2020-03-13 ENCOUNTER — Ambulatory Visit: Payer: PPO | Admitting: Family Medicine

## 2020-04-09 ENCOUNTER — Emergency Department (HOSPITAL_COMMUNITY): Payer: PPO

## 2020-04-09 ENCOUNTER — Other Ambulatory Visit: Payer: Self-pay

## 2020-04-09 ENCOUNTER — Inpatient Hospital Stay (HOSPITAL_COMMUNITY)
Admission: EM | Admit: 2020-04-09 | Discharge: 2020-04-19 | DRG: 252 | Disposition: A | Discharge status: Home / Self Care | Payer: PPO | Attending: Internal Medicine | Admitting: Internal Medicine

## 2020-04-09 ENCOUNTER — Encounter (HOSPITAL_COMMUNITY): Payer: Self-pay

## 2020-04-09 DIAGNOSIS — G459 Transient cerebral ischemic attack, unspecified: Secondary | ICD-10-CM | POA: Diagnosis not present

## 2020-04-09 DIAGNOSIS — R9082 White matter disease, unspecified: Secondary | ICD-10-CM | POA: Diagnosis not present

## 2020-04-09 DIAGNOSIS — M4854XA Collapsed vertebra, not elsewhere classified, thoracic region, initial encounter for fracture: Secondary | ICD-10-CM | POA: Diagnosis not present

## 2020-04-09 DIAGNOSIS — Z8249 Family history of ischemic heart disease and other diseases of the circulatory system: Secondary | ICD-10-CM | POA: Diagnosis not present

## 2020-04-09 DIAGNOSIS — E669 Obesity, unspecified: Secondary | ICD-10-CM | POA: Diagnosis present

## 2020-04-09 DIAGNOSIS — R29700 NIHSS score 0: Secondary | ICD-10-CM | POA: Diagnosis not present

## 2020-04-09 DIAGNOSIS — G894 Chronic pain syndrome: Secondary | ICD-10-CM | POA: Diagnosis present

## 2020-04-09 DIAGNOSIS — Z87891 Personal history of nicotine dependence: Secondary | ICD-10-CM

## 2020-04-09 DIAGNOSIS — I6522 Occlusion and stenosis of left carotid artery: Secondary | ICD-10-CM | POA: Diagnosis present

## 2020-04-09 DIAGNOSIS — I63232 Cerebral infarction due to unspecified occlusion or stenosis of left carotid arteries: Secondary | ICD-10-CM | POA: Diagnosis not present

## 2020-04-09 DIAGNOSIS — Z8719 Personal history of other diseases of the digestive system: Secondary | ICD-10-CM | POA: Diagnosis not present

## 2020-04-09 DIAGNOSIS — E785 Hyperlipidemia, unspecified: Secondary | ICD-10-CM | POA: Diagnosis present

## 2020-04-09 DIAGNOSIS — Z87442 Personal history of urinary calculi: Secondary | ICD-10-CM | POA: Diagnosis not present

## 2020-04-09 DIAGNOSIS — M1990 Unspecified osteoarthritis, unspecified site: Secondary | ICD-10-CM | POA: Diagnosis present

## 2020-04-09 DIAGNOSIS — I2510 Atherosclerotic heart disease of native coronary artery without angina pectoris: Principal | ICD-10-CM | POA: Diagnosis present

## 2020-04-09 DIAGNOSIS — R0789 Other chest pain: Secondary | ICD-10-CM | POA: Diagnosis not present

## 2020-04-09 DIAGNOSIS — I63233 Cerebral infarction due to unspecified occlusion or stenosis of bilateral carotid arteries: Secondary | ICD-10-CM | POA: Diagnosis not present

## 2020-04-09 DIAGNOSIS — Z6830 Body mass index (BMI) 30.0-30.9, adult: Secondary | ICD-10-CM

## 2020-04-09 DIAGNOSIS — Z20822 Contact with and (suspected) exposure to covid-19: Secondary | ICD-10-CM | POA: Diagnosis present

## 2020-04-09 DIAGNOSIS — I639 Cerebral infarction, unspecified: Secondary | ICD-10-CM

## 2020-04-09 DIAGNOSIS — H547 Unspecified visual loss: Secondary | ICD-10-CM | POA: Diagnosis present

## 2020-04-09 DIAGNOSIS — E876 Hypokalemia: Secondary | ICD-10-CM | POA: Diagnosis present

## 2020-04-09 DIAGNOSIS — M069 Rheumatoid arthritis, unspecified: Secondary | ICD-10-CM | POA: Diagnosis present

## 2020-04-09 DIAGNOSIS — I6529 Occlusion and stenosis of unspecified carotid artery: Secondary | ICD-10-CM | POA: Diagnosis present

## 2020-04-09 DIAGNOSIS — I700 Atherosclerosis of aorta: Secondary | ICD-10-CM | POA: Diagnosis not present

## 2020-04-09 DIAGNOSIS — R079 Chest pain, unspecified: Secondary | ICD-10-CM | POA: Diagnosis present

## 2020-04-09 DIAGNOSIS — G4700 Insomnia, unspecified: Secondary | ICD-10-CM | POA: Diagnosis present

## 2020-04-09 DIAGNOSIS — R002 Palpitations: Secondary | ICD-10-CM | POA: Diagnosis present

## 2020-04-09 DIAGNOSIS — K5730 Diverticulosis of large intestine without perforation or abscess without bleeding: Secondary | ICD-10-CM | POA: Diagnosis not present

## 2020-04-09 DIAGNOSIS — R0989 Other specified symptoms and signs involving the circulatory and respiratory systems: Secondary | ICD-10-CM | POA: Diagnosis present

## 2020-04-09 DIAGNOSIS — J3489 Other specified disorders of nose and nasal sinuses: Secondary | ICD-10-CM | POA: Diagnosis not present

## 2020-04-09 DIAGNOSIS — I351 Nonrheumatic aortic (valve) insufficiency: Secondary | ICD-10-CM | POA: Diagnosis present

## 2020-04-09 DIAGNOSIS — Y840 Cardiac catheterization as the cause of abnormal reaction of the patient, or of later complication, without mention of misadventure at the time of the procedure: Secondary | ICD-10-CM | POA: Diagnosis not present

## 2020-04-09 DIAGNOSIS — M0579 Rheumatoid arthritis with rheumatoid factor of multiple sites without organ or systems involvement: Secondary | ICD-10-CM | POA: Diagnosis not present

## 2020-04-09 DIAGNOSIS — Z7982 Long term (current) use of aspirin: Secondary | ICD-10-CM

## 2020-04-09 DIAGNOSIS — I97820 Postprocedural cerebrovascular infarction during cardiac surgery: Secondary | ICD-10-CM | POA: Diagnosis not present

## 2020-04-09 DIAGNOSIS — R42 Dizziness and giddiness: Secondary | ICD-10-CM | POA: Diagnosis not present

## 2020-04-09 DIAGNOSIS — I7090 Unspecified atherosclerosis: Secondary | ICD-10-CM | POA: Diagnosis not present

## 2020-04-09 DIAGNOSIS — I6381 Other cerebral infarction due to occlusion or stenosis of small artery: Secondary | ICD-10-CM | POA: Diagnosis not present

## 2020-04-09 DIAGNOSIS — I708 Atherosclerosis of other arteries: Secondary | ICD-10-CM | POA: Diagnosis present

## 2020-04-09 DIAGNOSIS — I771 Stricture of artery: Secondary | ICD-10-CM | POA: Diagnosis not present

## 2020-04-09 DIAGNOSIS — Z79899 Other long term (current) drug therapy: Secondary | ICD-10-CM

## 2020-04-09 DIAGNOSIS — J984 Other disorders of lung: Secondary | ICD-10-CM | POA: Diagnosis not present

## 2020-04-09 DIAGNOSIS — Z7952 Long term (current) use of systemic steroids: Secondary | ICD-10-CM

## 2020-04-09 DIAGNOSIS — G453 Amaurosis fugax: Secondary | ICD-10-CM | POA: Diagnosis not present

## 2020-04-09 DIAGNOSIS — R9431 Abnormal electrocardiogram [ECG] [EKG]: Secondary | ICD-10-CM | POA: Diagnosis not present

## 2020-04-09 DIAGNOSIS — I6503 Occlusion and stenosis of bilateral vertebral arteries: Secondary | ICD-10-CM | POA: Diagnosis not present

## 2020-04-09 DIAGNOSIS — R519 Headache, unspecified: Secondary | ICD-10-CM | POA: Diagnosis not present

## 2020-04-09 DIAGNOSIS — G9389 Other specified disorders of brain: Secondary | ICD-10-CM | POA: Diagnosis not present

## 2020-04-09 LAB — BASIC METABOLIC PANEL
Anion gap: 10 (ref 5–15)
BUN: 18 mg/dL (ref 8–23)
CO2: 21 mmol/L — ABNORMAL LOW (ref 22–32)
Calcium: 8.5 mg/dL — ABNORMAL LOW (ref 8.9–10.3)
Chloride: 107 mmol/L (ref 98–111)
Creatinine, Ser: 1.2 mg/dL (ref 0.61–1.24)
GFR calc Af Amer: >60 mL/min (ref >60)
GFR calc non Af Amer: >60 mL/min (ref >60)
Glucose, Bld: 134 mg/dL — ABNORMAL HIGH (ref 70–99)
Potassium: 3.3 mmol/L — ABNORMAL LOW (ref 3.5–5.1)
Sodium: 138 mmol/L (ref 135–145)

## 2020-04-09 LAB — CBC
HCT: 44.2 % (ref 39.0–52.0)
Hemoglobin: 14.6 g/dL (ref 13.0–17.0)
MCH: 32.1 pg (ref 26.0–34.0)
MCHC: 33 g/dL (ref 30.0–36.0)
MCV: 97.1 fL (ref 80.0–100.0)
Platelets: 467 10*3/uL — ABNORMAL HIGH (ref 150–400)
RBC: 4.55 MIL/uL (ref 4.22–5.81)
RDW: 14.5 % (ref 11.5–15.5)
WBC: 8.8 10*3/uL (ref 4.0–10.5)
nRBC: 0 % (ref 0.0–0.2)

## 2020-04-09 LAB — TROPONIN I (HIGH SENSITIVITY)
Troponin I (High Sensitivity): 8 ng/L (ref <18)
Troponin I (High Sensitivity): 8 ng/L (ref <18)

## 2020-04-09 MED ORDER — IOHEXOL 350 MG/ML SOLN
80.0000 mL | Freq: Once | INTRAVENOUS | Status: AC | PRN
  Administered 2020-04-09: 80 mL via INTRAVENOUS

## 2020-04-09 MED ORDER — IOHEXOL 350 MG/ML SOLN
60.0000 mL | Freq: Once | INTRAVENOUS | Status: AC | PRN
  Administered 2020-04-09: 60 mL via INTRAVENOUS

## 2020-04-09 NOTE — ED Triage Notes (Signed)
Pt reports central chest pain that radiates to his left arm, intermittent since last week. Pt also reports left arm numbness with the pain and dizziness. Denies any Sob or nausea.

## 2020-04-09 NOTE — ED Provider Notes (Signed)
[place] EMERGENCY DEPARTMENT Provider Note   CSN: [id number] Arrival date & time: 04/09/20  1511     History Chief Complaint  Patient presents with  . Chest Pain  . Dizziness    Ronald Cameron is a 64 y.o. male.  HPI Patient presents with chest last week states he had an episode when he woke up where he felt his heart racing and then developed chest pain with it.  Last little while resolved on its own.  Has been having some mild episodes on and off this week but more severe this morning when he woke up.  States he did not associate his heart racing.  States it is pressure in his mid chest that goes to his left arm.  States he will tingling the arm.  States he also has neck pain and has a dull headache.  States pain seems to be getting worse.  No shortness of breath.  Does somewhat get worse with breathing also worse with certain movements.  No fevers.  No cough.  Former smoker.  Has had a stress test years ago per patient.  States he feels more fatigued today.  Less activity than normal.  States he has had multiple family members die of heart attacks and get them at a relatively early age.    Past Medical History:  Diagnosis Date  . Arthritis   . Bowel obstruction ([place])   . Cataract   . Collagen vascular disease (HCC)    Rheumatoid Arthritis  . Colon polyps   . Diverticulosis   . Kidney stones   . Rheumatoid arthritis [facility])     Patient Active Problem List   Diagnosis Date Noted  . Displaced fracture of greater tuberosity of right humerus, initial encounter for closed fracture 08/29/2018  . Anterior dislocation of right shoulder 08/29/2018  . Enteritis   . Generalized abdominal pain   . Immunosuppressed status ([place]) 09/02/2017  . Morbid obesity with body mass index of 40.0-49.9 ([place]) 09/02/2017  . Aortic atherosclerosis ([place]) 09/01/2017  . Acute bilateral lower abdominal pain 08/31/2017  . Diarrhea 11/17/2016  . Abnormal hepatitis serology  11/17/2016  . Renal lesion 05/06/2016  . History of colonic polyps   . Diverticulosis of colon without hemorrhage   . Bloating 04/08/2016  . Encounter for screening colonoscopy 04/08/2016  . Colitis 04/06/2015  . Fatty liver 04/06/2015  . Early satiety 02/20/2015  . Abdominal pain, epigastric 02/20/2015  . Abdominal swelling, generalized 02/20/2015  . High risk medication use 02/20/2015  . Septic olecranon bursitis of right elbow 12/16/2013  . Pain in elbow 12/16/2013  . EXTERNAL HEMORRHOIDS 12/18/2008  . GERD 12/18/2008  . NEPHROLITHIASIS, HX OF 12/18/2008  . Rheumatoid arthritis ([place]) 12/17/2008  . Hemorrhoids 12/15/2008  . RECTAL BLEEDING 12/15/2008  . RECTAL PAIN 12/15/2008  . Lower abdominal pain 12/15/2008    Past Surgical History:  Procedure Laterality Date  . CATARACT EXTRACTION, BILATERAL    . COLONOSCOPY N/A 04/13/2016   Dr. [name]: 14 mm polyp in the ascending colon removed, diverticulosis in the distal descending colon and sigmoid colon, nonbleeding internal hemorrhoids. Pathology-tubular adenoma. Next colonoscopy 5 years.  . ELBOW SURGERY     X 3 last year. Staph infection but not MRSA per patient. infectious disease. out of work 6 months.   . TONSILLECTOMY         Family History  Problem Relation Age of Onset  . Heart attack Father   . Heart disease Father   .  Diabetes Mother   . Congestive Heart Failure Mother   . Heart disease Sister   . Heart disease Brother   . Heart disease Sister   . Colon cancer Neg Hx   . Liver disease Neg Hx     Social History   Tobacco Use  . Smoking status: Former Smoker    Packs/day: 1.00    Years: 39.00    Pack years: 39.00    Types: Cigarettes    Start date: 07/11/1975    Quit date: 04/26/2014    Years since quitting: 5.9  . Smokeless tobacco: Never Used  . Tobacco comment: Quit x 3 years  Vaping Use  . Vaping Use: Never used  Substance Use Topics  . Alcohol use: No    Alcohol/week: 0.0 standard drinks  . Drug  use: No    Home Medications Prior to Admission medications   Medication Sig Start Date End Date Taking? Authorizing Provider  alendronate (FOSAMAX) 70 MG tablet Take 70 mg by mouth once a week.  09/12/19  Yes [provider]  Ascorbic Acid (VITAMIN C) 1000 MG tablet Take 1,000 mg by mouth daily.   Yes [provider]  aspirin 81 MG tablet Take 81 mg by mouth daily.   Yes [provider]  Calcium Carb-Cholecalciferol (CALCIUM-VITAMIN D) 600-400 MG-UNIT TABS Take 1 tablet by mouth daily. 1200mg  daily   Yes [provider]  folic acid (FOLVITE) 1 MG tablet Take 1 mg by mouth daily.   Yes [provider]  Methotrexate Sodium (METHOTREXATE, PF,) 50 MG/2ML injection Inject 12.5 mg into the muscle once a week.  09/12/17  Yes [provider]  predniSONE (DELTASONE) 10 MG tablet Take 10 mg by mouth daily. 04/01/20  Yes [provider]  Tofacitinib Citrate ER (XELJANZ XR) 11 MG TB24 Take 11 mg by mouth daily.   Yes [provider]  traMADol (ULTRAM) 50 MG tablet Take 1 tablet (50 mg total) by mouth every 6 (six) hours as needed. Patient taking differently: Take 50-100 mg by mouth every 6 (six) hours as needed for moderate pain.  08/31/17  Yes Milton Ferguson, MD  traZODone (DESYREL) 50 MG tablet Take 50 mg by mouth at bedtime as needed for sleep.    Yes [provider]  ciprofloxacin (CIPRO) 500 MG tablet Take 1 tablet (500 mg total) by mouth 2 (two) times daily. Patient not taking: Reported on 04/09/2020 03/12/20   Sharion Balloon, FNP  metroNIDAZOLE (FLAGYL) 500 MG tablet Take 1 tablet (500 mg total) by mouth 3 (three) times daily. Patient not taking: Reported on 04/09/2020 03/12/20   Evelina Dun A, FNP  tamsulosin (FLOMAX) 0.4 MG CAPS capsule Take 1 capsule (0.4 mg total) by mouth daily. Patient not taking: Reported on 2/99/2426 8/34/19   Delora Fuel, MD    Allergies    Patient has no known allergies.  Review of  Systems   Review of Systems  Constitutional: Positive for fatigue.  HENT: Negative for congestion.   Respiratory: Negative for shortness of breath.   Cardiovascular: Positive for chest pain.  Gastrointestinal: Negative for abdominal pain.  Genitourinary: Negative for flank pain.  Musculoskeletal: Positive for neck pain.  Skin: Negative for rash.  Neurological: Positive for dizziness. Negative for numbness.  Psychiatric/Behavioral: Negative for confusion.    Physical Exam Updated Vital Signs BP (!) 150/78 (BP Location: Left Arm)   Pulse (!) 58   Temp 98.4 F (36.9 C) (Oral)   Resp 12   Ht  5\' 11"  (1.803 m)   Wt 100.2 kg   SpO2 100%   BMI 30.82 kg/m   Physical Exam Vitals and nursing note reviewed.  Constitutional:      Appearance: He is well-developed.  HENT:     Head: Atraumatic.  Eyes:     Extraocular Movements: Extraocular movements intact.  Cardiovascular:     Rate and Rhythm: Normal rate and regular rhythm.     Heart sounds: No murmur heard.   Pulmonary:     Breath sounds: No decreased breath sounds, wheezing or rhonchi.  Abdominal:     Tenderness: There is no abdominal tenderness.  Musculoskeletal:     Right lower leg: No edema.     Left lower leg: No edema.  Skin:    General: Skin is warm.     Capillary Refill: Capillary refill takes less than 2 seconds.  Neurological:     Mental Status: He is oriented to person, place, and time.     ED Results / Procedures / Treatments   Labs (all labs ordered are listed, but only abnormal results are displayed) Labs Reviewed  BASIC METABOLIC PANEL - Abnormal; Notable for the following components:      Result Value   Potassium 3.3 (*)    CO2 21 (*)    Glucose, Bld 134 (*)    Calcium 8.5 (*)    All other components within normal limits  CBC - Abnormal; Notable for the following components:   Platelets 467 (*)    All other components within normal limits  TROPONIN I (HIGH SENSITIVITY)  TROPONIN I (HIGH  SENSITIVITY)    EKG EKG Interpretation  Date/Time:  Thursday April 09 2020 20:38:08 EDT Ventricular Rate:  58 PR Interval:  176 QRS Duration: 113 QT Interval:  447 QTC Calculation: 439 R Axis:   71 Text Interpretation: Sinus rhythm Borderline intraventricular conduction delay Abnrm T, consider ischemia, anterolateral lds Confirmed by Davonna Belling [phone]) on 04/09/2020 8:39:56 PM   Radiology DG Chest 2 View  Result Date: 04/09/2020 CLINICAL DATA:  Chest pain and dizziness. EXAM: CHEST - 2 VIEW COMPARISON:  Remote exam 10/18/2003 FINDINGS: Stable upper normal heart size.The cardiomediastinal contours are normal. Mild bronchial thickening. Pulmonary vasculature is normal. No consolidation, pleural effusion, or pneumothorax. No acute osseous abnormalities are seen. IMPRESSION: Mild bronchial thickening. Electronically Signed   By: Keith Rake M.D.   On: 04/09/2020 16:05   CT Head Wo Contrast  Result Date: 04/09/2020 CLINICAL DATA:  Headache and dizziness. EXAM: CT HEAD WITHOUT CONTRAST TECHNIQUE: Contiguous axial images were obtained from the base of the skull through the vertex without intravenous contrast. COMPARISON:  12/04/2016 FINDINGS: Brain: No intracranial hemorrhage, mass effect, or midline shift. No hydrocephalus. Brain volume is normal for age. The basilar cisterns are patent. No evidence of territorial infarct or acute ischemia. No extra-axial or intracranial fluid collection. Vascular: Atherosclerosis of skullbase vasculature without hyperdense vessel or abnormal calcification. Skull: No fracture or focal lesion. Sinuses/Orbits: Paranasal sinuses and mastoid air cells are clear. The visualized orbits are unremarkable. Bilateral lens extraction. Other: None. IMPRESSION: No acute intracranial abnormality. Electronically Signed   By: Keith Rake M.D.   On: 04/09/2020 21:47   CT Angio Neck W and/or Wo Contrast  Result Date: 04/09/2020 CLINICAL DATA:  Initial evaluation  for acute headache, dizziness. EXAM: CT ANGIOGRAPHY NECK TECHNIQUE: Multidetector CT imaging of the neck was performed using the standard protocol during bolus administration of intravenous contrast. Multiplanar CT image reconstructions and MIPs were  obtained to evaluate the vascular anatomy. Carotid stenosis measurements (when applicable) are obtained utilizing NASCET criteria, using the distal internal carotid diameter as the denominator. CONTRAST:  39mL OMNIPAQUE IOHEXOL 350 MG/ML SOLN COMPARISON:  Prior head CT from earlier the same day. FINDINGS: Aortic arch: Visualized aortic arch of normal caliber with normal branch pattern. Mild atheromatous change within the arch itself. No high-grade stenosis about the origin of the great vessels. Right carotid system: Mild scattered nonstenotic plaque present within the right common carotid artery without significant stenosis. Bulky mixed plaque about the right bifurcation/proximal right ICA with associated stenosis of up to 35% by NASCET criteria. Right ICA otherwise patent without stenosis, dissection or occlusion. Left carotid system: Eccentric soft plaque seen throughout the left CCA without high-grade stenosis. Extensive predominantly soft plaque about the left bifurcation/proximal left ICA with associated severe near occlusive stenosis (series 7, image 122). A radiographic string sign is present. Area of involvement begins at the left bifurcation and measures approximately 19 mm in length. Distally, left ICA patent without stenosis, dissection, or occlusion. Vertebral arteries: Both vertebral arteries arise from the subclavian arteries. Short-segment stenosis of approximately 65% noted within the proximal left subclavian artery prior to the takeoff of the left vertebral artery (series 8, image 113). Atheromatous plaque at the origin of the left vertebral artery with estimated 50% stenosis. Mild plaque at the origin of the right vertebral artery without significant  stenosis. Right vertebral artery is dominant. Additional scattered calcified plaque within the proximal V1/V2 segments bilaterally without high-grade stenosis. Vertebral arteries otherwise patent without stenosis, dissection, or occlusion. Skeleton: No acute osseous abnormality. No discrete or worrisome osseous lesions. Other neck: No other acute soft tissue abnormality within the neck. No mass lesion or adenopathy. Few subcentimeter hypodense thyroid nodules measuring up to 4 mm noted, felt to be of doubtful significance given size and patient age. No follow-up imaging recommended regarding these lesions. Upper chest: Chronic fibrotic changes noted within the visualized lungs, better evaluated on concomitant chest CT. IMPRESSION: 1. Negative CTA for acute dissection or other abnormality. 2. 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 Vascular surgery consultation and referral recommended for further evaluation and consideration of possible CEA. 3. Bulky mixed plaque about the right carotid bifurcation/proximal right ICA with associated stenosis of up to 35% by NASCET criteria. 4. 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 5. 50% atheromatous stenosis at the origin of the left vertebral artery. Right vertebral artery is dominant. Electronically Signed   By: Jeannine Boga M.D.   On: 04/09/2020 23:01   CT Angio Chest/Abd/Pel for Dissection W and/or Wo Contrast  Result Date: 04/09/2020 CLINICAL DATA:  64 year old male with chest and back pain. Dizziness. EXAM: CT ANGIOGRAPHY CHEST, ABDOMEN AND PELVIS TECHNIQUE: Multidetector CT imaging through the chest, abdomen and pelvis was performed using the standard protocol during bolus administration of intravenous contrast. Multiplanar reconstructed images and MIPs were obtained and reviewed to evaluate the vascular anatomy. CONTRAST:  41mL OMNIPAQUE IOHEXOL 350 MG/ML SOLN COMPARISON:   Noncontrast CT Abdomen and Pelvis 02/04/2018. CTA abdomen and pelvis 09/02/2017. Thoracic spine CT 10/19/2003 FINDINGS: CTA CHEST FINDINGS Cardiovascular: Calcified coronary artery atherosclerosis. Comparatively mild calcified plaque of the thoracic aorta. Negative for thoracic aortic dissection or aneurysm. Mild central pulmonary artery enlargement. Bilateral central pulmonary arteries also appear to be patent. Mild cardiomegaly. No pericardial effusion. Mediastinum/Nodes: Negative.  No lymphadenopathy. Lungs/Pleura: Major airways are patent. Larger lung volumes compared to 2018. Scattered mild  bilateral subpleural reticular and/or ground-glass opacity which has a chronic postinflammatory appearance. No pleural effusion. There is a stable small right lower lobe lung nodule since 09/02/2017 (benign) on series 5, image 45. Musculoskeletal: Chronic T5 compression fracture demonstrated in 2005. Stable mild T11 endplate irregularity since 2018. No acute osseous abnormality identified. Review of the MIP images confirms the above findings. CTA ABDOMEN AND PELVIS FINDINGS VASCULAR From the renal arteries distally there is extensive Aortoiliac calcified atherosclerosis. But the major arterial structures remain patent in the abdomen and pelvis. Negative for aneurysm or dissection. The celiac origin and SMA origin remain normal. No mesenteric atherosclerosis identified. The IMA is patent. Review of the MIP images confirms the above findings. NON-VASCULAR Hepatobiliary: Negative liver and gallbladder. Pancreas: Negative aside from some fatty pancreatic atrophy. Spleen: Negative. Adrenals/Urinary Tract: Normal adrenal glands. Bilateral renal enhancement is symmetric and normal. No hydronephrosis. Normal ureters to the bladder. Unremarkable urinary bladder. Renal vascular calcification suspected, nephrolithiasis felt less likely. Stomach/Bowel: Diverticulosis of the sigmoid colon with no active inflammation. Occasional  descending colon diverticula without inflammation. Normal retrocecal appendix (series 7, image 184). No large bowel inflammation. Negative terminal ileum. No dilated small bowel. Decompressed stomach. Negative duodenum. No free air, free fluid, or mesenteric stranding. Lymphatic: No lymphadenopathy. Reproductive: Small chronic fat containing left inguinal hernia, otherwise negative. Other: No pelvic free fluid. Musculoskeletal: Transitional lumbosacral anatomy with sacralized L5 level. No acute osseous abnormality identified. Review of the MIP images confirms the above findings. IMPRESSION: 1. Negative for aortic dissection or aneurysm. Positive for Aortic Atherosclerosis (ICD10-I70.0), from the renal artery level distally. And positive for calcified coronary artery atherosclerosis. Major arterial structures remain patent. 2. No acute or inflammatory process identified in the chest, abdomen, or pelvis. 3. Mild chronic lung disease. Diverticulosis of the sigmoid colon. Questionable punctate nephrolithiasis. Chronic T5 compression fracture. Electronically Signed   By: Genevie Ann M.D.   On: 04/09/2020 22:10    Procedures Procedures (including critical care time)  Medications Ordered in ED Medications  iohexol (OMNIPAQUE) 350 MG/ML injection 80 mL (80 mLs Intravenous Contrast Given 04/09/20 2157)  iohexol (OMNIPAQUE) 350 MG/ML injection 60 mL (60 mLs Intravenous Contrast Given 04/09/20 2202)    ED Course  I have reviewed the triage vital signs and the nursing notes.  Pertinent labs & imaging results that were available during my care of the patient were reviewed by me and considered in my medical decision making (see chart for details).    MDM Rules/Calculators/A&P                         Patient with chest chest going to arm neck and somewhat back.  Negative dissection study.  Does have calcified coronary artery atherosclerosis.  Troponin negative.  Does have nonspecific EKG changes.  Will admit to  medicine. Final Clinical Impression(s) / ED Diagnoses Final diagnoses:  Chest pain, unspecified type    Rx / DC Orders ED Discharge Orders    None       Davonna Belling, MD 04/09/20 2315

## 2020-04-10 ENCOUNTER — Inpatient Hospital Stay (HOSPITAL_COMMUNITY): Payer: PPO

## 2020-04-10 ENCOUNTER — Observation Stay (HOSPITAL_COMMUNITY): Payer: PPO

## 2020-04-10 ENCOUNTER — Encounter (HOSPITAL_COMMUNITY): Payer: Self-pay | Admitting: Family Medicine

## 2020-04-10 ENCOUNTER — Encounter (HOSPITAL_COMMUNITY)
Admission: EM | Disposition: A | Discharge status: Home / Self Care | Payer: Self-pay | Source: Home / Self Care | Attending: Internal Medicine

## 2020-04-10 DIAGNOSIS — Z6830 Body mass index (BMI) 30.0-30.9, adult: Secondary | ICD-10-CM | POA: Diagnosis not present

## 2020-04-10 DIAGNOSIS — Y840 Cardiac catheterization as the cause of abnormal reaction of the patient, or of later complication, without mention of misadventure at the time of the procedure: Secondary | ICD-10-CM | POA: Diagnosis not present

## 2020-04-10 DIAGNOSIS — Z87442 Personal history of urinary calculi: Secondary | ICD-10-CM | POA: Diagnosis not present

## 2020-04-10 DIAGNOSIS — R079 Chest pain, unspecified: Secondary | ICD-10-CM | POA: Diagnosis not present

## 2020-04-10 DIAGNOSIS — R9431 Abnormal electrocardiogram [ECG] [EKG]: Secondary | ICD-10-CM | POA: Diagnosis not present

## 2020-04-10 DIAGNOSIS — E669 Obesity, unspecified: Secondary | ICD-10-CM | POA: Diagnosis present

## 2020-04-10 DIAGNOSIS — Z8719 Personal history of other diseases of the digestive system: Secondary | ICD-10-CM | POA: Diagnosis not present

## 2020-04-10 DIAGNOSIS — I708 Atherosclerosis of other arteries: Secondary | ICD-10-CM | POA: Diagnosis present

## 2020-04-10 DIAGNOSIS — I639 Cerebral infarction, unspecified: Secondary | ICD-10-CM | POA: Diagnosis not present

## 2020-04-10 DIAGNOSIS — Z7982 Long term (current) use of aspirin: Secondary | ICD-10-CM | POA: Diagnosis not present

## 2020-04-10 DIAGNOSIS — I351 Nonrheumatic aortic (valve) insufficiency: Secondary | ICD-10-CM

## 2020-04-10 DIAGNOSIS — I6522 Occlusion and stenosis of left carotid artery: Secondary | ICD-10-CM

## 2020-04-10 DIAGNOSIS — H547 Unspecified visual loss: Secondary | ICD-10-CM | POA: Diagnosis present

## 2020-04-10 DIAGNOSIS — I97820 Postprocedural cerebrovascular infarction during cardiac surgery: Secondary | ICD-10-CM | POA: Diagnosis not present

## 2020-04-10 DIAGNOSIS — G4700 Insomnia, unspecified: Secondary | ICD-10-CM | POA: Diagnosis present

## 2020-04-10 DIAGNOSIS — R29700 NIHSS score 0: Secondary | ICD-10-CM | POA: Diagnosis not present

## 2020-04-10 DIAGNOSIS — R0989 Other specified symptoms and signs involving the circulatory and respiratory systems: Secondary | ICD-10-CM | POA: Diagnosis present

## 2020-04-10 DIAGNOSIS — M0579 Rheumatoid arthritis with rheumatoid factor of multiple sites without organ or systems involvement: Secondary | ICD-10-CM | POA: Diagnosis not present

## 2020-04-10 DIAGNOSIS — G894 Chronic pain syndrome: Secondary | ICD-10-CM | POA: Diagnosis present

## 2020-04-10 DIAGNOSIS — Z87891 Personal history of nicotine dependence: Secondary | ICD-10-CM | POA: Diagnosis not present

## 2020-04-10 DIAGNOSIS — Z20822 Contact with and (suspected) exposure to covid-19: Secondary | ICD-10-CM | POA: Diagnosis present

## 2020-04-10 DIAGNOSIS — M069 Rheumatoid arthritis, unspecified: Secondary | ICD-10-CM | POA: Diagnosis present

## 2020-04-10 DIAGNOSIS — E785 Hyperlipidemia, unspecified: Secondary | ICD-10-CM | POA: Diagnosis present

## 2020-04-10 DIAGNOSIS — G453 Amaurosis fugax: Secondary | ICD-10-CM | POA: Diagnosis not present

## 2020-04-10 DIAGNOSIS — G459 Transient cerebral ischemic attack, unspecified: Secondary | ICD-10-CM | POA: Diagnosis not present

## 2020-04-10 DIAGNOSIS — E876 Hypokalemia: Secondary | ICD-10-CM | POA: Diagnosis present

## 2020-04-10 DIAGNOSIS — Z8249 Family history of ischemic heart disease and other diseases of the circulatory system: Secondary | ICD-10-CM | POA: Diagnosis not present

## 2020-04-10 DIAGNOSIS — M1990 Unspecified osteoarthritis, unspecified site: Secondary | ICD-10-CM | POA: Diagnosis present

## 2020-04-10 DIAGNOSIS — I6381 Other cerebral infarction due to occlusion or stenosis of small artery: Secondary | ICD-10-CM | POA: Diagnosis not present

## 2020-04-10 DIAGNOSIS — I2510 Atherosclerotic heart disease of native coronary artery without angina pectoris: Secondary | ICD-10-CM | POA: Diagnosis present

## 2020-04-10 DIAGNOSIS — R002 Palpitations: Secondary | ICD-10-CM | POA: Diagnosis present

## 2020-04-10 DIAGNOSIS — Z7952 Long term (current) use of systemic steroids: Secondary | ICD-10-CM | POA: Diagnosis not present

## 2020-04-10 HISTORY — PX: LEFT HEART CATH AND CORONARY ANGIOGRAPHY: CATH118249

## 2020-04-10 HISTORY — PX: INTRAVASCULAR PRESSURE WIRE/FFR STUDY: CATH118243

## 2020-04-10 LAB — ECHOCARDIOGRAM COMPLETE
AR max vel: 2.09 cm2
AV Area VTI: 2.04 cm2
AV Area mean vel: 1.95 cm2
AV Mean grad: 5 mmHg
AV Peak grad: 9.7 mmHg
Ao pk vel: 1.56 m/s
Area-P 1/2: 2.24 cm2
Height: 71 in
P 1/2 time: 789 msec
S' Lateral: 3.4 cm
Weight: 3536 oz

## 2020-04-10 LAB — BASIC METABOLIC PANEL
Anion gap: 8 (ref 5–15)
BUN: 17 mg/dL (ref 8–23)
CO2: 17 mmol/L — ABNORMAL LOW (ref 22–32)
Calcium: 8.1 mg/dL — ABNORMAL LOW (ref 8.9–10.3)
Chloride: 110 mmol/L (ref 98–111)
Creatinine, Ser: 0.99 mg/dL (ref 0.61–1.24)
GFR calc Af Amer: >60 mL/min (ref >60)
GFR calc non Af Amer: >60 mL/min (ref >60)
Glucose, Bld: 108 mg/dL — ABNORMAL HIGH (ref 70–99)
Potassium: 3.8 mmol/L (ref 3.5–5.1)
Sodium: 135 mmol/L (ref 135–145)

## 2020-04-10 LAB — CBC
HCT: 43.3 % (ref 39.0–52.0)
Hemoglobin: 13.9 g/dL (ref 13.0–17.0)
MCH: 32.1 pg (ref 26.0–34.0)
MCHC: 32.1 g/dL (ref 30.0–36.0)
MCV: 100 fL (ref 80.0–100.0)
Platelets: 436 10*3/uL — ABNORMAL HIGH (ref 150–400)
RBC: 4.33 MIL/uL (ref 4.22–5.81)
RDW: 14.5 % (ref 11.5–15.5)
WBC: 8.2 10*3/uL (ref 4.0–10.5)
nRBC: 0 % (ref 0.0–0.2)

## 2020-04-10 LAB — CK: Total CK: 42 U/L — ABNORMAL LOW (ref 49–397)

## 2020-04-10 LAB — HIV ANTIBODY (ROUTINE TESTING W REFLEX): HIV Screen 4th Generation wRfx: NONREACTIVE

## 2020-04-10 LAB — LIPID PANEL
Cholesterol: 235 mg/dL — ABNORMAL HIGH (ref 0–200)
HDL: 32 mg/dL — ABNORMAL LOW (ref >40)
LDL Cholesterol: 184 mg/dL — ABNORMAL HIGH (ref 0–99)
Total CHOL/HDL Ratio: 7.3 RATIO
Triglycerides: 96 mg/dL (ref <150)
VLDL: 19 mg/dL (ref 0–40)

## 2020-04-10 LAB — MAGNESIUM: Magnesium: 1.9 mg/dL (ref 1.7–2.4)

## 2020-04-10 LAB — SARS CORONAVIRUS 2 BY RT PCR (HOSPITAL ORDER, PERFORMED IN ~~LOC~~ HOSPITAL LAB): SARS Coronavirus 2: NEGATIVE

## 2020-04-10 SURGERY — LEFT HEART CATH AND CORONARY ANGIOGRAPHY
Anesthesia: LOCAL

## 2020-04-10 MED ORDER — VERAPAMIL HCL 2.5 MG/ML IV SOLN
INTRAVENOUS | Status: AC
  Filled 2020-04-10: qty 2

## 2020-04-10 MED ORDER — NITROGLYCERIN 0.4 MG SL SUBL: 0.4000 mg | SUBLINGUAL_TABLET | SUBLINGUAL | Status: DC | PRN

## 2020-04-10 MED ORDER — ASPIRIN 81 MG PO CHEW
324.0000 mg | CHEWABLE_TABLET | ORAL | Status: AC
  Administered 2020-04-10: 324 mg via ORAL
  Filled 2020-04-10: qty 4

## 2020-04-10 MED ORDER — ENOXAPARIN SODIUM 40 MG/0.4ML ~~LOC~~ SOLN
40.0000 mg | SUBCUTANEOUS | Status: DC
  Administered 2020-04-11: 40 mg via SUBCUTANEOUS
  Filled 2020-04-10: qty 0.4

## 2020-04-10 MED ORDER — IOHEXOL 350 MG/ML SOLN
INTRAVENOUS | Status: DC | PRN
  Administered 2020-04-10: 75 mL

## 2020-04-10 MED ORDER — SODIUM CHLORIDE 0.9 % WEIGHT BASED INFUSION
3.0000 mL/kg/h | INTRAVENOUS | Status: DC
  Administered 2020-04-10: 3 mL/kg/h via INTRAVENOUS

## 2020-04-10 MED ORDER — SODIUM CHLORIDE 0.9 % WEIGHT BASED INFUSION
1.0000 mL/kg/h | INTRAVENOUS | Status: AC
  Administered 2020-04-10 (×2): 1 mL/kg/h via INTRAVENOUS

## 2020-04-10 MED ORDER — SODIUM CHLORIDE 0.9% FLUSH: 3.0000 mL | INTRAVENOUS | Status: DC | PRN

## 2020-04-10 MED ORDER — POTASSIUM CHLORIDE CRYS ER 20 MEQ PO TBCR
40.0000 meq | EXTENDED_RELEASE_TABLET | Freq: Once | ORAL | Status: AC
  Administered 2020-04-10: 40 meq via ORAL
  Filled 2020-04-10: qty 2

## 2020-04-10 MED ORDER — SODIUM CHLORIDE 0.9 % WEIGHT BASED INFUSION: 1.0000 mL/kg/h | INTRAVENOUS | Status: DC

## 2020-04-10 MED ORDER — SODIUM CHLORIDE 0.9% FLUSH
3.0000 mL | Freq: Two times a day (BID) | INTRAVENOUS | Status: DC
  Administered 2020-04-11 – 2020-04-15 (×9): 3 mL via INTRAVENOUS

## 2020-04-10 MED ORDER — ASPIRIN 81 MG PO CHEW: 81.0000 mg | CHEWABLE_TABLET | ORAL | Status: DC

## 2020-04-10 MED ORDER — SODIUM CHLORIDE 0.9 % IV SOLN: 250.0000 mL | INTRAVENOUS | Status: DC | PRN

## 2020-04-10 MED ORDER — HEPARIN (PORCINE) IN NACL 1000-0.9 UT/500ML-% IV SOLN
INTRAVENOUS | Status: AC
  Filled 2020-04-10: qty 1000

## 2020-04-10 MED ORDER — LIDOCAINE HCL (PF) 1 % IJ SOLN
INTRAMUSCULAR | Status: DC | PRN
  Administered 2020-04-10: 2 mL

## 2020-04-10 MED ORDER — ENOXAPARIN SODIUM 40 MG/0.4ML ~~LOC~~ SOLN
40.0000 mg | SUBCUTANEOUS | Status: DC
  Administered 2020-04-10: 40 mg via SUBCUTANEOUS
  Filled 2020-04-10: qty 0.4

## 2020-04-10 MED ORDER — ATORVASTATIN CALCIUM 40 MG PO TABS
40.0000 mg | ORAL_TABLET | Freq: Every day | ORAL | Status: DC
  Administered 2020-04-10 – 2020-04-11 (×2): 40 mg via ORAL
  Filled 2020-04-10 (×2): qty 1

## 2020-04-10 MED ORDER — ONDANSETRON HCL 4 MG/2ML IJ SOLN: 4.0000 mg | Freq: Four times a day (QID) | INTRAMUSCULAR | Status: DC | PRN

## 2020-04-10 MED ORDER — MIDAZOLAM HCL 2 MG/2ML IJ SOLN
INTRAMUSCULAR | Status: DC | PRN
  Administered 2020-04-10: 1 mg via INTRAVENOUS

## 2020-04-10 MED ORDER — NITROGLYCERIN 1 MG/10 ML FOR IR/CATH LAB
INTRA_ARTERIAL | Status: DC | PRN
  Administered 2020-04-10: 200 ug via INTRACORONARY

## 2020-04-10 MED ORDER — TRAZODONE HCL 50 MG PO TABS
50.0000 mg | ORAL_TABLET | Freq: Every evening | ORAL | Status: DC | PRN
  Administered 2020-04-10 – 2020-04-14 (×4): 50 mg via ORAL
  Filled 2020-04-10 (×4): qty 1

## 2020-04-10 MED ORDER — HEPARIN (PORCINE) IN NACL 1000-0.9 UT/500ML-% IV SOLN
INTRAVENOUS | Status: DC | PRN
  Administered 2020-04-10 (×3): 500 mL

## 2020-04-10 MED ORDER — FENTANYL CITRATE (PF) 100 MCG/2ML IJ SOLN
INTRAMUSCULAR | Status: AC
  Filled 2020-04-10: qty 2

## 2020-04-10 MED ORDER — ASPIRIN 300 MG RE SUPP: 300.0000 mg | RECTAL | Status: AC

## 2020-04-10 MED ORDER — PREDNISONE 10 MG PO TABS
10.0000 mg | ORAL_TABLET | Freq: Every day | ORAL | Status: DC
  Administered 2020-04-10 – 2020-04-19 (×10): 10 mg via ORAL
  Filled 2020-04-10 (×4): qty 1
  Filled 2020-04-10: qty 2
  Filled 2020-04-10 (×5): qty 1

## 2020-04-10 MED ORDER — TOFACITINIB CITRATE ER 11 MG PO TB24
11.0000 mg | ORAL_TABLET | Freq: Every day | ORAL | Status: DC
  Administered 2020-04-11 – 2020-04-19 (×9): 11 mg via ORAL
  Filled 2020-04-10 (×9): qty 1

## 2020-04-10 MED ORDER — NITROGLYCERIN 1 MG/10 ML FOR IR/CATH LAB
INTRA_ARTERIAL | Status: AC
  Filled 2020-04-10: qty 10

## 2020-04-10 MED ORDER — SODIUM CHLORIDE 0.9% FLUSH
3.0000 mL | Freq: Two times a day (BID) | INTRAVENOUS | Status: DC
  Administered 2020-04-10 – 2020-04-19 (×6): 3 mL via INTRAVENOUS

## 2020-04-10 MED ORDER — MIDAZOLAM HCL 2 MG/2ML IJ SOLN
INTRAMUSCULAR | Status: AC
  Filled 2020-04-10: qty 2

## 2020-04-10 MED ORDER — SODIUM CHLORIDE 0.9% FLUSH
3.0000 mL | Freq: Two times a day (BID) | INTRAVENOUS | Status: DC
  Administered 2020-04-10 – 2020-04-18 (×17): 3 mL via INTRAVENOUS

## 2020-04-10 MED ORDER — CLOPIDOGREL BISULFATE 75 MG PO TABS
75.0000 mg | ORAL_TABLET | Freq: Every day | ORAL | Status: DC
  Administered 2020-04-10 – 2020-04-19 (×10): 75 mg via ORAL
  Filled 2020-04-10 (×10): qty 1

## 2020-04-10 MED ORDER — FENTANYL CITRATE (PF) 100 MCG/2ML IJ SOLN
INTRAMUSCULAR | Status: DC | PRN
  Administered 2020-04-10: 25 ug via INTRAVENOUS

## 2020-04-10 MED ORDER — LIDOCAINE HCL (PF) 1 % IJ SOLN
INTRAMUSCULAR | Status: AC
  Filled 2020-04-10: qty 30

## 2020-04-10 MED ORDER — ACETAMINOPHEN 325 MG PO TABS: 650.0000 mg | ORAL_TABLET | ORAL | Status: DC | PRN

## 2020-04-10 MED ORDER — TRAMADOL HCL 50 MG PO TABS
50.0000 mg | ORAL_TABLET | Freq: Four times a day (QID) | ORAL | Status: DC | PRN
  Administered 2020-04-10 – 2020-04-16 (×5): 50 mg via ORAL
  Filled 2020-04-10 (×5): qty 1

## 2020-04-10 MED ORDER — ASPIRIN 81 MG PO CHEW
81.0000 mg | CHEWABLE_TABLET | Freq: Every day | ORAL | Status: DC
  Administered 2020-04-10 – 2020-04-19 (×10): 81 mg via ORAL
  Filled 2020-04-10 (×10): qty 1

## 2020-04-10 MED ORDER — VERAPAMIL HCL 2.5 MG/ML IV SOLN
INTRAVENOUS | Status: DC | PRN
  Administered 2020-04-10: 10 mL via INTRA_ARTERIAL

## 2020-04-10 MED ORDER — FOLIC ACID 1 MG PO TABS
1.0000 mg | ORAL_TABLET | Freq: Every day | ORAL | Status: DC
  Administered 2020-04-10 – 2020-04-19 (×10): 1 mg via ORAL
  Filled 2020-04-10 (×10): qty 1

## 2020-04-10 MED ORDER — ALENDRONATE SODIUM 70 MG PO TABS: 70.0000 mg | ORAL_TABLET | ORAL | Status: DC

## 2020-04-10 SURGICAL SUPPLY — 11 items
CATH 5FR JL3.5 JR4 ANG PIG MP (CATHETERS) ×1 IMPLANT
CATH LAUNCHER 6FR AL.75 (CATHETERS) ×1 IMPLANT
DEVICE RAD COMP TR BAND LRG (VASCULAR PRODUCTS) ×1 IMPLANT
GLIDESHEATH SLEND SS 6F .021 (SHEATH) ×1 IMPLANT
GUIDEWIRE INQWIRE 1.5J.035X260 (WIRE) IMPLANT
GUIDEWIRE PRESSURE COMET II (WIRE) ×1 IMPLANT
INQWIRE 1.5J .035X260CM (WIRE) ×2
KIT HEART LEFT (KITS) ×2 IMPLANT
PACK CARDIAC CATHETERIZATION (CUSTOM PROCEDURE TRAY) ×2 IMPLANT
TRANSDUCER W/STOPCOCK (MISCELLANEOUS) ×2 IMPLANT
TUBING CIL FLEX 10 FLL-RA (TUBING) ×2 IMPLANT

## 2020-04-10 NOTE — H&P (View-Only) (Signed)
Cardiology Consultation:   Patient ID: Ronald Cameron; [id number]; [birth year]   Admit date: 04/09/2020 Date of Consult: 04/10/2020  Primary Care Provider: Claretta Fraise, MD Primary Cardiologist: Dr Harl Bowie   Patient Profile:   Ronald Cameron is a 64 y.o. male with a hx of coronary calcifications on CT with CV risk factors which include remote tobacco use and strong family hx of CAD, new HLD dx and new carotid artery disease dx who is being seen today for the evaluation of chest pain at the request of Dr. Myna Hidalgo.   History of Present Illness:   Ronald Cameron is a 64yo M with a hx as stated above who presented to [facility] on 04/09/20 with a two week hx of intermittent chest pain. He reports that two weeks ago he was awakened by an acute episode of anterior chest pain with radiation to his left arm and left jaw which resolved after 10 mins. He states that since that time the chest pain has come and gone at undetermined times occurring at least once per day. Along with the above symptoms, he was having dizziness and palpitations prior to ED presentation.   In the ED, EKG with NSR and inferolateral TWI and no ST changes. CXR with mild bronchial thickening. CTA chest with no acute changes. Neck CTA with severe near occlusive atheromatous stenosis involving the left ICA. Plan was for hospitalist admission with cardiology and vascular consultations.   He has been remotely followed by Dr. Harl Bowie for coronary calcifications and strong family hx of CAD. He remotely underwent an [facility] 03/2002 which showed non-obstructive CAD with patent LAD, 30% 1st OM, 50-60% 3rd OM, 20% RCA disease and normal LV function. He then fell off follow up until 2016 at which time he was seen for calcifications found incidentally on CT. He underwent an echocardiogram 04/03/2015 which showed stable LV function at 60-65% with normal wall motion, G1DD, and mild AR. Stress test from 04/15/2015 with inadequate HR response which was  limited by arthritic leg pain at which time Lexiscan was used. Test showed a small defect of mild severity present in the apical anterior location, defect is non-reversible consistent with soft tissue attenuation, small defect of mild severity present in the basal inferior, mid inferior and mid inferolateral location. The defect is partially reversible. Consistent with variable diaphragmatic soft tissue attenuation, although cannot completely exclude small area of mid inferolateral mild ischemia. He has not been seen since that time.   He denies recurrent chest pain since admission. Denies SOB, LE edema, orthopnea, PND or syncope. He reports strong family hx in his brother and two sisters with CABG and carotid artery disease in his mother. Follows with PCP.   Past Medical History:  Diagnosis Date   Arthritis    Bowel obstruction (HCC)    Cataract    Collagen vascular disease ([place])    Rheumatoid Arthritis   Colon polyps    Diverticulosis    Kidney stones    Rheumatoid arthritis [facility])     Past Surgical History:  Procedure Laterality Date   CATARACT EXTRACTION, BILATERAL     COLONOSCOPY N/A 04/13/2016   Dr. [name]: 14 mm polyp in the ascending colon removed, diverticulosis in the distal descending colon and sigmoid colon, nonbleeding internal hemorrhoids. Pathology-tubular adenoma. Next colonoscopy 5 years.   ELBOW SURGERY     X 3 last year. Staph infection but not MRSA per patient. infectious disease. out of work 6 months.    TONSILLECTOMY  Prior to Admission medications   Medication Sig Start Date End Date Taking? Authorizing Provider  alendronate (FOSAMAX) 70 MG tablet Take 70 mg by mouth once a week.  09/12/19  Yes [provider]  Ascorbic Acid (VITAMIN C) 1000 MG tablet Take 1,000 mg by mouth daily.   Yes [provider]  aspirin 81 MG tablet Take 81 mg by mouth daily.   Yes [provider]  Calcium Carb-Cholecalciferol (CALCIUM-VITAMIN D) 600-400  MG-UNIT TABS Take 1 tablet by mouth daily. 1200mg  daily   Yes [provider]  folic acid (FOLVITE) 1 MG tablet Take 1 mg by mouth daily.   Yes [provider]  Methotrexate Sodium (METHOTREXATE, PF,) 50 MG/2ML injection Inject 12.5 mg into the muscle once a week.  09/12/17  Yes [provider]  predniSONE (DELTASONE) 10 MG tablet Take 10 mg by mouth daily. 04/01/20  Yes [provider]  Tofacitinib Citrate ER (XELJANZ XR) 11 MG TB24 Take 11 mg by mouth daily.   Yes [provider]  traMADol (ULTRAM) 50 MG tablet Take 1 tablet (50 mg total) by mouth every 6 (six) hours as needed. Patient taking differently: Take 50-100 mg by mouth every 6 (six) hours as needed for moderate pain.  08/31/17  Yes Milton Ferguson, MD  traZODone (DESYREL) 50 MG tablet Take 50 mg by mouth at bedtime as needed for sleep.    Yes [provider]  tamsulosin (FLOMAX) 0.4 MG CAPS capsule Take 1 capsule (0.4 mg total) by mouth daily. Patient not taking: Reported on 05/28/4096 3/53/29   Delora Fuel, MD    Inpatient Medications: Scheduled Meds:  aspirin  81 mg Oral Daily   atorvastatin  40 mg Oral Daily   enoxaparin (LOVENOX) injection  40 mg Subcutaneous J24Q   folic acid  1 mg Oral Daily   predniSONE  10 mg Oral Daily   sodium chloride flush  3 mL Intravenous Q12H   Tofacitinib Citrate ER  11 mg Oral Daily   Continuous Infusions:  sodium chloride     PRN Meds: sodium chloride, acetaminophen, nitroGLYCERIN, ondansetron (ZOFRAN) IV, sodium chloride flush, traMADol, traZODone  Allergies:   No Known Allergies  Social History:   Social History   Socioeconomic History   Marital status: Married    Spouse name: Not on file   Number of children: 0   Years of education: Not on file   Highest education level: 12th grade  Occupational History   Occupation: Disbility  Tobacco Use   Smoking status: Former Smoker    Packs/day: 1.00    Years: 39.00    Pack years:  39.00    Types: Cigarettes    Start date: 07/11/1975    Quit date: 04/26/2014    Years since quitting: 5.9   Smokeless tobacco: Never Used   Tobacco comment: Quit x 3 years  Vaping Use   Vaping Use: Never used  Substance and Sexual Activity   Alcohol use: No    Alcohol/week: 0.0 standard drinks   Drug use: No   Sexual activity: Not Currently  Other Topics Concern   Not on file  Social History Narrative   Not on file   Social Determinants of Health   Financial Resource Strain:    Difficulty of Paying Living Expenses:   Food Insecurity:    Worried About Estate manager/land agent of Food in the Last Year:    Ran Out of Food in the Last Year:   Transportation Needs:  Lack of Transportation (Medical):    Lack of Transportation (Non-Medical):   Physical Activity:    Days of Exercise per Week:    Minutes of Exercise per Session:   Stress:    Feeling of Stress :   Social Connections:    Frequency of Communication with Friends and Family:    Frequency of Social Gatherings with Friends and Family:    Attends Religious Services:    Active Member of Clubs or Organizations:    Attends Music therapist:    Marital Status:   Intimate Partner Violence:    Fear of Current or Ex-Partner:    Emotionally Abused:    Physically Abused:    Sexually Abused:     Family History:   Family History  Problem Relation Age of Onset   Heart attack Father    Heart disease Father    Diabetes Mother    Congestive Heart Failure Mother    Heart disease Sister    Heart disease Brother    Heart disease Sister    Colon cancer Neg Hx    Liver disease Neg Hx    Family Status:  Family Status  Relation Name Status   Father  Deceased   Mother  Deceased   Sister  Alive   Brother  Alive   Brother  Alive   Brother  Deceased   Sister  Alive   Neg Hx  (Not Specified)    ROS:  Please see the history of present illness.  All other ROS reviewed and negative.     Physical Exam/Data:   Vitals:     04/10/20 0700 04/10/20 0821 04/10/20 0848 04/10/20 0925  BP: 103/65 120/60  (!) 125/112  Pulse: (!) 49 (!) 55  (!) 59  Resp: 14 14  14   Temp:   97.7 F (36.5 C) 98.6 F (37 C)  TempSrc:   Oral Oral  SpO2: 98% 96%  94%  Weight:      Height:       No intake or output data in the 24 hours ending 04/10/20 0951 Filed Weights   04/09/20 1526 04/09/20 2039  Weight: 100.2 kg 100.2 kg   Body mass index is 30.82 kg/m.   General: Well developed, well nourished, NAD Neck: Negative for carotid bruits. No JVD Lungs: Diminished in bilateral lower lobes. Breathing is unlabored. Cardiovascular: RRR with S1 S2. No murmurs Abdomen: Soft, non-tender, non-distended. No obvious abdominal masses. Extremities: No edema. Radial  pulses 2+ bilaterally Neuro: Alert and oriented. No focal deficits. No facial asymmetry. MAE spontaneously. Psych: Responds to questions appropriately with normal affect.    EKG:  The EKG was personally reviewed and demonstrates: 04/10/20 SB HR 53bpm with inferolateral TWI, changed from prior tracing  Telemetry:  Telemetry was personally reviewed and demonstrates: 04/10/20 NSR/SB HRs in the 50-60  Relevant CV Studies:  ECHO: Pending   Echocardiogram 04/03/2015:  Study Conclusions   - Left ventricle: The cavity size was normal. There was mild    concentric hypertrophy. Systolic function was normal. The    estimated ejection fraction was in the range of 60% to 65%. Wall    motion was normal; there were no regional wall motion    abnormalities. Doppler parameters are consistent with abnormal    left ventricular relaxation (grade 1 diastolic dysfunction).  - Aortic valve: Mildly calcified annulus. Trileaflet; mildly    calcified leaflets. There was mild regurgitation.   Study Conclusions   - Left ventricle: The cavity size  was normal. There was mild    concentric hypertrophy. Systolic function was normal. The    estimated ejection fraction was in the range of 60% to  65%. Wall    motion was normal; there were no regional wall motion    abnormalities. Doppler parameters are consistent with abnormal    left ventricular relaxation (grade 1 diastolic dysfunction).  - Aortic valve: Mildly calcified annulus. Trileaflet; mildly    calcified leaflets. There was mild regurgitation.   Stress test 04/15/2015:  There was no significant ST segment deviation noted during stress. Inadeqaute HR response with exercise, limited by arthritic leg pain. Lexiscan employed. There is a small defect of mild severity present in the apical anterior location. The defect is non-reversible. Consistent with soft tissue attenuation. There is a small defect of mild severity present in the basal inferior, mid inferior and mid inferolateral location. The defect is partially reversible. Consistent with variable diaphragmatic soft tissue attenuation, although cannot completely exclude small area of mid inferolateral mild ischemia. This is a low risk study. Nuclear stress EF: 70%.   CATH: 03/2002:  RESULTS: 1. The left main coronary artery is widely patent and bifurcates into a    left anterior descending artery and left circumflex artery. 2. The left anterior descending artery is widely patent throughout its    course except for some luminal irregularities. It gives rise to one    large diagonal branch which is widely patent. 3. Left circumflex is widely patent throughout its course and gives rise to    three obtuse marginal branches. The first obtuse marginal branch has a    proximal 30% narrowing. The second obtuse marginal branch is very small    and the third obtuse marginal branch has a 50-60% ostial narrowing. 4. Right coronary artery is widely patent throughout its course except for    luminal irregularities up to 20%. There is a distal 30% narrowing just    before the bifurcation of the posterior descending artery and    posterolateral artery, both of which are widely patent  except for some    luminal irregularities in the posterolateral artery up to 20%.   LEFT VENTRICULOGRAM: The left ventriculography performed in the 30-degree RAO view using a total of 30 cc of contrast at 13 cc/sec. showed normal LV function. No regional wall motion abnormalities. Aortic pressure 103/62, LV 113/23 mmHg.   ASSESSMENT: 1. Noncardiac chest pain. 2. Nonobstructive coronary disease. 3. Normal left ventricular function.  Laboratory Data:  Chemistry Recent Labs  Lab 04/09/20 1536 04/10/20 0436  NA 138 135  K 3.3* 3.8  CL 107 110  CO2 21* 17*  GLUCOSE 134* 108*  BUN 18 17  CREATININE 1.20 0.99  CALCIUM 8.5* 8.1*  GFRNONAA >60 >60  GFRAA >60 >60  ANIONGAP 10 8    Total Protein  Date Value Ref Range Status  09/03/2017 6.5 6.5 - 8.1 g/dL Final   Albumin  Date Value Ref Range Status  09/03/2017 3.6 3.5 - 5.0 g/dL Final   AST  Date Value Ref Range Status  09/03/2017 24 15 - 41 U/L Final   ALT  Date Value Ref Range Status  09/03/2017 33 17 - 63 U/L Final   Alkaline Phosphatase  Date Value Ref Range Status  09/03/2017 39 38 - 126 U/L Final   Total Bilirubin  Date Value Ref Range Status  09/03/2017 1.0 0.3 - 1.2 mg/dL Final   Hematology Recent Labs  Lab 04/09/20 1536 04/10/20 0436  WBC 8.8 8.2  RBC 4.55 4.33  HGB 14.6 13.9  HCT 44.2 43.3  MCV 97.1 100.0  MCH 32.1 32.1  MCHC 33.0 32.1  RDW 14.5 14.5  PLT 467* 436*   Cardiac EnzymesNo results for input(s): TROPONINI in the last 168 hours. No results for input(s): TROPIPOC in the last 168 hours.  BNPNo results for input(s): BNP, PROBNP in the last 168 hours.  DDimer No results for input(s): DDIMER in the last 168 hours. TSH: No results found for: TSH Lipids: Lab Results  Component Value Date   CHOL 235 (H) 04/10/2020   HDL 32 (L) 04/10/2020   LDLCALC 184 (H) 04/10/2020   TRIG 96 04/10/2020   CHOLHDL 7.3 04/10/2020   HgbA1c: Lab Results  Component Value Date   HGBA1C 5.4 03/27/2015      Radiology/Studies:  DG Chest 2 View  Result Date: 04/09/2020 CLINICAL DATA:  Chest pain and dizziness. EXAM: CHEST - 2 VIEW COMPARISON:  Remote exam 10/18/2003 FINDINGS: Stable upper normal heart size.The cardiomediastinal contours are normal. Mild bronchial thickening. Pulmonary vasculature is normal. No consolidation, pleural effusion, or pneumothorax. No acute osseous abnormalities are seen. IMPRESSION: Mild bronchial thickening. Electronically Signed   By: Keith Rake M.D.   On: 04/09/2020 16:05   CT Head Wo Contrast  Result Date: 04/09/2020 CLINICAL DATA:  Headache and dizziness. EXAM: CT HEAD WITHOUT CONTRAST TECHNIQUE: Contiguous axial images were obtained from the base of the skull through the vertex without intravenous contrast. COMPARISON:  12/04/2016 FINDINGS: Brain: No intracranial hemorrhage, mass effect, or midline shift. No hydrocephalus. Brain volume is normal for age. The basilar cisterns are patent. No evidence of territorial infarct or acute ischemia. No extra-axial or intracranial fluid collection. Vascular: Atherosclerosis of skullbase vasculature without hyperdense vessel or abnormal calcification. Skull: No fracture or focal lesion. Sinuses/Orbits: Paranasal sinuses and mastoid air cells are clear. The visualized orbits are unremarkable. Bilateral lens extraction. Other: None. IMPRESSION: No acute intracranial abnormality. Electronically Signed   By: Keith Rake M.D.   On: 04/09/2020 21:47   CT Angio Neck W and/or Wo Contrast  Result Date: 04/09/2020 CLINICAL DATA:  Initial evaluation for acute headache, dizziness. EXAM: CT ANGIOGRAPHY NECK TECHNIQUE: Multidetector CT imaging of the neck was performed using the standard protocol during bolus administration of intravenous contrast. Multiplanar CT image reconstructions and MIPs were obtained to evaluate the vascular anatomy. Carotid stenosis measurements (when applicable) are obtained utilizing NASCET criteria, using  the distal internal carotid diameter as the denominator. CONTRAST:  99mL OMNIPAQUE IOHEXOL 350 MG/ML SOLN COMPARISON:  Prior head CT from earlier the same day. FINDINGS: Aortic arch: Visualized aortic arch of normal caliber with normal branch pattern. Mild atheromatous change within the arch itself. No high-grade stenosis about the origin of the great vessels. Right carotid system: Mild scattered nonstenotic plaque present within the right common carotid artery without significant stenosis. Bulky mixed plaque about the right bifurcation/proximal right ICA with associated stenosis of up to 35% by NASCET criteria. Right ICA otherwise patent without stenosis, dissection or occlusion. Left carotid system: Eccentric soft plaque seen throughout the left CCA without high-grade stenosis. Extensive predominantly soft plaque about the left bifurcation/proximal left ICA with associated severe near occlusive stenosis (series 7, image 122). A radiographic string sign is present. Area of involvement begins at the left bifurcation and measures approximately 19 mm in length. Distally, left ICA patent without stenosis, dissection, or occlusion. Vertebral arteries: Both vertebral arteries arise from the subclavian arteries. Short-segment stenosis of approximately 65%  noted within the proximal left subclavian artery prior to the takeoff of the left vertebral artery (series 8, image 113). Atheromatous plaque at the origin of the left vertebral artery with estimated 50% stenosis. Mild plaque at the origin of the right vertebral artery without significant stenosis. Right vertebral artery is dominant. Additional scattered calcified plaque within the proximal V1/V2 segments bilaterally without high-grade stenosis. Vertebral arteries otherwise patent without stenosis, dissection, or occlusion. Skeleton: No acute osseous abnormality. No discrete or worrisome osseous lesions. Other neck: No other acute soft tissue abnormality within the neck.  No mass lesion or adenopathy. Few subcentimeter hypodense thyroid nodules measuring up to 4 mm noted, felt to be of doubtful significance given size and patient age. No follow-up imaging recommended regarding these lesions. Upper chest: Chronic fibrotic changes noted within the visualized lungs, better evaluated on concomitant chest CT. IMPRESSION: 1. Negative CTA for acute dissection or other abnormality. 2. 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 Vascular surgery consultation and referral recommended for further evaluation and consideration of possible CEA. 3. Bulky mixed plaque about the right carotid bifurcation/proximal right ICA with associated stenosis of up to 35% by NASCET criteria. 4. 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 5. 50% atheromatous stenosis at the origin of the left vertebral artery. Right vertebral artery is dominant. Electronically Signed   By: Jeannine Boga M.D.   On: 04/09/2020 23:01   CT Angio Chest/Abd/Pel for Dissection W and/or Wo Contrast  Result Date: 04/09/2020 CLINICAL DATA:  64 year old male with chest and back pain. Dizziness. EXAM: CT ANGIOGRAPHY CHEST, ABDOMEN AND PELVIS TECHNIQUE: Multidetector CT imaging through the chest, abdomen and pelvis was performed using the standard protocol during bolus administration of intravenous contrast. Multiplanar reconstructed images and MIPs were obtained and reviewed to evaluate the vascular anatomy. CONTRAST:  48mL OMNIPAQUE IOHEXOL 350 MG/ML SOLN COMPARISON:  Noncontrast CT Abdomen and Pelvis 02/04/2018. CTA abdomen and pelvis 09/02/2017. Thoracic spine CT 10/19/2003 FINDINGS: CTA CHEST FINDINGS Cardiovascular: Calcified coronary artery atherosclerosis. Comparatively mild calcified plaque of the thoracic aorta. Negative for thoracic aortic dissection or aneurysm. Mild central pulmonary artery enlargement. Bilateral central pulmonary  arteries also appear to be patent. Mild cardiomegaly. No pericardial effusion. Mediastinum/Nodes: Negative.  No lymphadenopathy. Lungs/Pleura: Major airways are patent. Larger lung volumes compared to 2018. Scattered mild bilateral subpleural reticular and/or ground-glass opacity which has a chronic postinflammatory appearance. No pleural effusion. There is a stable small right lower lobe lung nodule since 09/02/2017 (benign) on series 5, image 45. Musculoskeletal: Chronic T5 compression fracture demonstrated in 2005. Stable mild T11 endplate irregularity since 2018. No acute osseous abnormality identified. Review of the MIP images confirms the above findings. CTA ABDOMEN AND PELVIS FINDINGS VASCULAR From the renal arteries distally there is extensive Aortoiliac calcified atherosclerosis. But the major arterial structures remain patent in the abdomen and pelvis. Negative for aneurysm or dissection. The celiac origin and SMA origin remain normal. No mesenteric atherosclerosis identified. The IMA is patent. Review of the MIP images confirms the above findings. NON-VASCULAR Hepatobiliary: Negative liver and gallbladder. Pancreas: Negative aside from some fatty pancreatic atrophy. Spleen: Negative. Adrenals/Urinary Tract: Normal adrenal glands. Bilateral renal enhancement is symmetric and normal. No hydronephrosis. Normal ureters to the bladder. Unremarkable urinary bladder. Renal vascular calcification suspected, nephrolithiasis felt less likely. Stomach/Bowel: Diverticulosis of the sigmoid colon with no active inflammation. Occasional descending colon diverticula without inflammation. Normal retrocecal appendix (series 7, image 184). No large bowel inflammation. Negative terminal ileum.  No dilated small bowel. Decompressed stomach. Negative duodenum. No free air, free fluid, or mesenteric stranding. Lymphatic: No lymphadenopathy. Reproductive: Small chronic fat containing left inguinal hernia, otherwise negative.  Other: No pelvic free fluid. Musculoskeletal: Transitional lumbosacral anatomy with sacralized L5 level. No acute osseous abnormality identified. Review of the MIP images confirms the above findings. IMPRESSION: 1. Negative for aortic dissection or aneurysm. Positive for Aortic Atherosclerosis (ICD10-I70.0), from the renal artery level distally. And positive for calcified coronary artery atherosclerosis. Major arterial structures remain patent. 2. No acute or inflammatory process identified in the chest, abdomen, or pelvis. 3. Mild chronic lung disease. Diverticulosis of the sigmoid colon. Questionable punctate nephrolithiasis. Chronic T5 compression fracture. Electronically Signed   By: Genevie Ann M.D.   On: 04/09/2020 22:10   Assessment and Plan:   1. Chest pain with multiple CV risk factors: -Pt presented to [facility] on 04/09/20 with a two week hx of intermittent chest pain>>two weeks ago he was awakened by an acute episode of anterior chest pain with radiation to his left arm and left jaw which resolved after 10 mins. He states that since that time the chest pain has come and gone at undetermined times occurring at least once per day. Along with the above symptoms, he was having dizziness and palpitations prior to ED presentation.  -EKG with NSR and inferolateral TWI and no ST changes.  -CXR with mild bronchial thickening.  -CTA chest with no acute changes.  -Neck CTA with severe near occlusive atheromatous stenosis involving the left ICA.  -hsT, 8>>8 -LDL, 184 -LHC 03/2002 which showed non-obstructive CAD with patent LAD, 30% 1st OM, 50-60% 3rd OM, 20% RCA disease and normal LV function -Echocardiogram 04/03/2015 which showed stable LV function at 60-65% with normal wall motion, G1DD, and mild AR.  -Stress test from 04/15/2015 with inadequate HR response which was limited by arthritic leg pain at which time Lexiscan was used. Test showed a small defect of mild severity present in the apical anterior location,  defect is non-reversible consistent with soft tissue attenuation, small defect of mild severity present in the basal inferior, mid inferior and mid inferolateral location. The defect is partially reversible. Consistent with variable diaphragmatic soft tissue attenuation, although cannot completely exclude small area of mid inferolateral mild ischemia.  -Given strong family hx, multiple risk factors including family hx, new dx HLD, tobacco use and typical anginal symptoms presentation>will proceed with LHC for further CV assessment  -Continue ASA -Start high intensity atorvastatin -No BB in the setting of baseline bradycardia  -Cardiac catheterization was discussed with the patient fully. The patient understands that risks include but are not limited to stroke (1 in 1000), death (1 in 83), kidney failure [usually temporary] (1 in 500), bleeding (1 in 200), allergic reaction [possibly serious] (1 in 200).  The patient understands and is willing to proceed.    2. 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 -VVS consulted per primary team  -Doppler studies ordered -Continue ASA, statin   3. Bradycardia: -Asymptomatic, HR 50's -Not currently on AV blocking agents   4. HLD: -LDL, 184 -Started on atorvastatin 40>>increase to 80 -Needs repeat lipid panel in 6-8 weeks   For questions or updates, please contact [place] Please consult [email or web address] for contact info under Cardiology/STEMI.   Lyndel Safe NP-C [facility] Pager: [phone] 04/10/2020 9:51 AM  [place] examined chart reviewed Discussed care with patient and NP. Exam with overweight white male  left bruit SEM AR not audible Lungs clear abdomen benign palpable pedal pulses and no edema. SSCP / palpitations Telemetry with no arrhythmia ECG NSR with diffuse T wave inversions. Horrendous family history of premature CAD. Troponin negative and  preliminary echo at bedside with normal EF no RWMA mild AR. He appears to have critical LICA stenosis on CTA Image review shows bulky mixed plaque. Given likely need for left CEA, family history , chest pain and abnormal ECG will arrange left heart cath today to clarify CAD. He will need inpatient VVS consult and will order carotid duplex for further physiologic evaluation of ICA. Risks of cath including stroke, bleeding MI and need for emergency surgery discussed He had contrast yesterday with no issues and normal renal function Willing to proceed. Orders written and lab notified He has not eaten this am   Jenkins Rouge MD [facility]

## 2020-04-10 NOTE — ED Notes (Signed)
Patient sating at low 90's, denies SOB and refused oxygen.

## 2020-04-10 NOTE — Progress Notes (Signed)
PROGRESS NOTE    Patient: Ronald Cameron                            PCP: Claretta Fraise, MD                    DOB: 16-Sep-1956            DOA: 04/09/2020 [id number]             DOS: 04/10/2020, 7:04 AM   LOS: 0 days   Date of Service: The patient was seen and examined on 04/10/2020  Subjective:   The patient was seen and examined this Am. Hemodynamically stable Still complaining of intermittent chest pain  Brief Narrative:   Ronald Cameron is a 64 y.o. male with medical history significant for rheumatoid arthritis, insomnia, chronic pain, and recent treatment for diverticulitis, now presenting to the emergency department for evaluation of palpitations and chest discomfort.  He reports being treated for acute diverticulitis 1 month ago, notes that the abdominal pain has resolved, but continues to have some nausea.    ED Course: EKG features a sinus rhythm with inferolateral T wave inversions.  Chest x-ray with mild bronchial thickening.  Noncontrast head CT negative for acute intracranial abnormality.  CTA chest/abdomen/pelvis negative for aortic dissection or aneurysm.   CTA neck is concerning for severe near occlusive atheromatous stenosis involving the left ICA.  COVID-19 negative.   Note: Reporting extensive family history of coronary artery disease, vasculopathy  Assessment & Plan:   Principal Problem:   Chest pain Active Problems:   Rheumatoid arthritis (HCC)   Carotid stenosis   Hypokalemia   Chest pain, palpitations  - Presents with one week of intermittent rapid palpitations and chest discomfort  - EKG with T-wave inversions,  - HS troponin normal x2, and no acute aortic pathology on CTA chest/abd/pelvis  - Continue cardiac monitoring, replace potassium, continue ASA, start statin, check lipids   -LDL 84 -on full dose statin now -Last echo 04/03/2015 ejection fraction 60-65%, normal LV function mild MR, stress test 04/15/2015 within normal  limits -Extensive family history of cardiovascular, vasculopathy -Cardiology consulted -appreciate input, recommendation-we will proceed with cardiac catheterization   Carotid artery stenosis  - Near-occlusive atheromatous stenosis involving left ICA noted on CTA in ED  - He reports recent dizziness but no focal deficit  - Continue ASA, start statin,  -Vascular surgery Dr. Oneida Alar consulted -appreciate further evaluation recommendation   Hypokalemia  - Will monitor and replete accordingly  Rheumatoid arthritis  - Stable, managed with Morrie Sheldon, prednisone, and methotrexate     DVT prophylaxis: Lovenox  Code Status: Full  Family Communication: Discussed with patient  Disposition Plan:  Patient is from: Home  Anticipated d/c is to: Home  Anticipated d/c date is: Possibly as early as 04/11/20 Patient currently: Pending further evaluation of chest pain  Consults called: None  Admission status: Observation       Procedures:     Cardiac catheterization    Antimicrobials:  Anti-infectives (From admission, onward)   None       Medication:  . aspirin  81 mg Oral Daily  . atorvastatin  40 mg Oral Daily  . enoxaparin (LOVENOX) injection  40 mg Subcutaneous Q24H  . folic acid  1 mg Oral Daily  . predniSONE  10 mg Oral Daily  . sodium chloride flush  3 mL Intravenous Q12H  . Tofacitinib Citrate ER  11  mg Oral Daily    sodium chloride, acetaminophen, nitroGLYCERIN, ondansetron (ZOFRAN) IV, sodium chloride flush, traMADol, traZODone   Objective:   Vitals:   04/10/20 0442 04/10/20 0500 04/10/20 0526 04/10/20 0600  BP: (!) 90/47 (!) 96/57  107/64  Pulse: (!) 58 68 (!) 59 (!) 58  Resp: 16 17 17 16   Temp:      TempSrc:      SpO2: 97% 91% 94% 95%  Weight:      Height:       No intake or output data in the 24 hours ending 04/10/20 0704 Filed Weights   04/09/20 1526 04/09/20 2039  Weight: 100.2 kg 100.2 kg     Examination:   Physical Exam  Constitution:   Alert, cooperative, no distress,  Appears calm and comfortable  Psychiatric: Normal and stable mood and affect, cognition intact,   HEENT: Normocephalic, PERRL, otherwise with in Normal limits  Chest:Chest symmetric Cardio vascular:  S1/S2, RRR, No murmure, No Rubs or Gallops  pulmonary: Clear to auscultation bilaterally, respirations unlabored, negative wheezes / crackles Abdomen: Soft, non-tender, non-distended, bowel sounds,no masses, no organomegaly Muscular skeletal: Limited exam - in bed, able to move all 4 extremities, Normal strength,  Neuro: CNII-XII intact. , normal motor and sensation, reflexes intact  Extremities: No pitting edema lower extremities, +2 pulses  Skin: Dry, warm to touch, negative for any Rashes, No open wounds Wounds: per nursing documentation    ------------------------------------------------------------------------------------------------------------------------------------------    LABs:  CBC Latest Ref Rng & Units 04/10/2020 04/09/2020 02/03/2018  WBC 4.0 - 10.5 K/uL 8.2 8.8 8.8  Hemoglobin 13.0 - 17.0 g/dL 13.9 14.6 14.0  Hematocrit 39 - 52 % 43.3 44.2 41.0  Platelets 150 - 400 K/uL 436(H) 467(H) 382   CMP Latest Ref Rng & Units 04/10/2020 04/09/2020 02/03/2018  Glucose 70 - 99 mg/dL 108(H) 134(H) 97  BUN 8 - 23 mg/dL 17 18 22(H)  Creatinine 0.61 - 1.24 mg/dL 0.99 1.20 1.04  Sodium 135 - 145 mmol/L 135 138 140  Potassium 3.5 - 5.1 mmol/L 3.8 3.3(L) 3.9  Chloride 98 - 111 mmol/L 110 107 108  CO2 22 - 32 mmol/L 17(L) 21(L) 23  Calcium 8.9 - 10.3 mg/dL 8.1(L) 8.5(L) 8.7(L)  Total Protein 6.5 - 8.1 g/dL - - -  Total Bilirubin 0.3 - 1.2 mg/dL - - -  Alkaline Phos 38 - 126 U/L - - -  AST 15 - 41 U/L - - -  ALT 17 - 63 U/L - - -       Micro Results Recent Results (from the past 240 hour(s))  SARS Coronavirus 2 by RT PCR (hospital order, performed in [facility] hospital lab) Nasopharyngeal Nasopharyngeal Swab     Status: None   Collection Time:  04/09/20 11:39 PM   Specimen: Nasopharyngeal Swab  Result Value Ref Range Status   SARS Coronavirus 2 NEGATIVE NEGATIVE Final    Comment: (NOTE) SARS-CoV-2 target nucleic acids are NOT DETECTED.  The SARS-CoV-2 RNA is generally detectable in upper and lower respiratory specimens during the acute phase of infection. The lowest concentration of SARS-CoV-2 viral copies this assay can detect is 250 copies / mL. A negative result does not preclude SARS-CoV-2 infection and should not be used as the sole basis for treatment or other patient management decisions.  A negative result may occur with improper specimen collection / handling, submission of specimen other than nasopharyngeal swab, presence of viral mutation(s) within the areas targeted by this assay, and inadequate number of  viral copies (<250 copies / mL). A negative result must be combined with clinical observations, patient history, and epidemiological information.  Fact Sheet for Patients:   StrictlyIdeas.no  Fact Sheet for Healthcare Providers: BankingDealers.co.za  This test is not yet approved or  cleared by the [place] FDA and has been authorized for detection and/or diagnosis of SARS-CoV-2 by FDA under an Emergency Use Authorization (EUA).  This EUA will remain in effect (meaning this test can be used) for the duration of the COVID-19 declaration under Section 564(b)(1) of the Act, 21 U.S.C. section 360bbb-3(b)(1), unless the authorization is terminated or revoked sooner.  Performed at [facility], [place] [address] [place], [place] [address]     Radiology Reports DG Chest 2 View  Result Date: 04/09/2020 CLINICAL DATA:  Chest pain and dizziness. EXAM: CHEST - 2 VIEW COMPARISON:  Remote exam 10/18/2003 FINDINGS: Stable upper normal heart size.The cardiomediastinal contours are normal. Mild bronchial thickening. Pulmonary vasculature is normal. No  consolidation, pleural effusion, or pneumothorax. No acute osseous abnormalities are seen. IMPRESSION: Mild bronchial thickening. Electronically Signed   By: Keith Rake M.D.   On: 04/09/2020 16:05   DG Abd 1 View  Result Date: 03/12/2020 CLINICAL DATA:  Nausea and abdominal pain. EXAM: ABDOMEN - 1 VIEW COMPARISON:  09/07/2017 FINDINGS: Bowel gas pattern is nonobstructive. No free peritoneal air. Remainder of the exam is unchanged. IMPRESSION: Nonobstructive bowel gas pattern. Electronically Signed   By: Marin Olp M.D.   On: 03/12/2020 14:22   CT Head Wo Contrast  Result Date: 04/09/2020 CLINICAL DATA:  Headache and dizziness. EXAM: CT HEAD WITHOUT CONTRAST TECHNIQUE: Contiguous axial images were obtained from the base of the skull through the vertex without intravenous contrast. COMPARISON:  12/04/2016 FINDINGS: Brain: No intracranial hemorrhage, mass effect, or midline shift. No hydrocephalus. Brain volume is normal for age. The basilar cisterns are patent. No evidence of territorial infarct or acute ischemia. No extra-axial or intracranial fluid collection. Vascular: Atherosclerosis of skullbase vasculature without hyperdense vessel or abnormal calcification. Skull: No fracture or focal lesion. Sinuses/Orbits: Paranasal sinuses and mastoid air cells are clear. The visualized orbits are unremarkable. Bilateral lens extraction. Other: None. IMPRESSION: No acute intracranial abnormality. Electronically Signed   By: Keith Rake M.D.   On: 04/09/2020 21:47   CT Angio Neck W and/or Wo Contrast  Result Date: 04/09/2020 CLINICAL DATA:  Initial evaluation for acute headache, dizziness. EXAM: CT ANGIOGRAPHY NECK TECHNIQUE: Multidetector CT imaging of the neck was performed using the standard protocol during bolus administration of intravenous contrast. Multiplanar CT image reconstructions and MIPs were obtained to evaluate the vascular anatomy. Carotid stenosis measurements (when applicable) are  obtained utilizing NASCET criteria, using the distal internal carotid diameter as the denominator. CONTRAST:  7mL OMNIPAQUE IOHEXOL 350 MG/ML SOLN COMPARISON:  Prior head CT from earlier the same day. FINDINGS: Aortic arch: Visualized aortic arch of normal caliber with normal branch pattern. Mild atheromatous change within the arch itself. No high-grade stenosis about the origin of the great vessels. Right carotid system: Mild scattered nonstenotic plaque present within the right common carotid artery without significant stenosis. Bulky mixed plaque about the right bifurcation/proximal right ICA with associated stenosis of up to 35% by NASCET criteria. Right ICA otherwise patent without stenosis, dissection or occlusion. Left carotid system: Eccentric soft plaque seen throughout the left CCA without high-grade stenosis. Extensive predominantly soft plaque about the left bifurcation/proximal left ICA with associated severe near occlusive stenosis (series 7, image 122). A radiographic  string sign is present. Area of involvement begins at the left bifurcation and measures approximately 19 mm in length. Distally, left ICA patent without stenosis, dissection, or occlusion. Vertebral arteries: Both vertebral arteries arise from the subclavian arteries. Short-segment stenosis of approximately 65% noted within the proximal left subclavian artery prior to the takeoff of the left vertebral artery (series 8, image 113). Atheromatous plaque at the origin of the left vertebral artery with estimated 50% stenosis. Mild plaque at the origin of the right vertebral artery without significant stenosis. Right vertebral artery is dominant. Additional scattered calcified plaque within the proximal V1/V2 segments bilaterally without high-grade stenosis. Vertebral arteries otherwise patent without stenosis, dissection, or occlusion. Skeleton: No acute osseous abnormality. No discrete or worrisome osseous lesions. Other neck: No other  acute soft tissue abnormality within the neck. No mass lesion or adenopathy. Few subcentimeter hypodense thyroid nodules measuring up to 4 mm noted, felt to be of doubtful significance given size and patient age. No follow-up imaging recommended regarding these lesions. Upper chest: Chronic fibrotic changes noted within the visualized lungs, better evaluated on concomitant chest CT. IMPRESSION: 1. Negative CTA for acute dissection or other abnormality. 2. 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 Vascular surgery consultation and referral recommended for further evaluation and consideration of possible CEA. 3. Bulky mixed plaque about the right carotid bifurcation/proximal right ICA with associated stenosis of up to 35% by NASCET criteria. 4. 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 5. 50% atheromatous stenosis at the origin of the left vertebral artery. Right vertebral artery is dominant. Electronically Signed   By: Jeannine Boga M.D.   On: 04/09/2020 23:01   CT Angio Chest/Abd/Pel for Dissection W and/or Wo Contrast  Result Date: 04/09/2020 CLINICAL DATA:  64 year old male with chest and back pain. Dizziness. EXAM: CT ANGIOGRAPHY CHEST, ABDOMEN AND PELVIS TECHNIQUE: Multidetector CT imaging through the chest, abdomen and pelvis was performed using the standard protocol during bolus administration of intravenous contrast. Multiplanar reconstructed images and MIPs were obtained and reviewed to evaluate the vascular anatomy. CONTRAST:  49mL OMNIPAQUE IOHEXOL 350 MG/ML SOLN COMPARISON:  Noncontrast CT Abdomen and Pelvis 02/04/2018. CTA abdomen and pelvis 09/02/2017. Thoracic spine CT 10/19/2003 FINDINGS: CTA CHEST FINDINGS Cardiovascular: Calcified coronary artery atherosclerosis. Comparatively mild calcified plaque of the thoracic aorta. Negative for thoracic aortic dissection or aneurysm. Mild central pulmonary artery  enlargement. Bilateral central pulmonary arteries also appear to be patent. Mild cardiomegaly. No pericardial effusion. Mediastinum/Nodes: Negative.  No lymphadenopathy. Lungs/Pleura: Major airways are patent. Larger lung volumes compared to 2018. Scattered mild bilateral subpleural reticular and/or ground-glass opacity which has a chronic postinflammatory appearance. No pleural effusion. There is a stable small right lower lobe lung nodule since 09/02/2017 (benign) on series 5, image 45. Musculoskeletal: Chronic T5 compression fracture demonstrated in 2005. Stable mild T11 endplate irregularity since 2018. No acute osseous abnormality identified. Review of the MIP images confirms the above findings. CTA ABDOMEN AND PELVIS FINDINGS VASCULAR From the renal arteries distally there is extensive Aortoiliac calcified atherosclerosis. But the major arterial structures remain patent in the abdomen and pelvis. Negative for aneurysm or dissection. The celiac origin and SMA origin remain normal. No mesenteric atherosclerosis identified. The IMA is patent. Review of the MIP images confirms the above findings. NON-VASCULAR Hepatobiliary: Negative liver and gallbladder. Pancreas: Negative aside from some fatty pancreatic atrophy. Spleen: Negative. Adrenals/Urinary Tract: Normal adrenal glands. Bilateral renal enhancement is symmetric and normal. No hydronephrosis. Normal ureters to  the bladder. Unremarkable urinary bladder. Renal vascular calcification suspected, nephrolithiasis felt less likely. Stomach/Bowel: Diverticulosis of the sigmoid colon with no active inflammation. Occasional descending colon diverticula without inflammation. Normal retrocecal appendix (series 7, image 184). No large bowel inflammation. Negative terminal ileum. No dilated small bowel. Decompressed stomach. Negative duodenum. No free air, free fluid, or mesenteric stranding. Lymphatic: No lymphadenopathy. Reproductive: Small chronic fat containing left  inguinal hernia, otherwise negative. Other: No pelvic free fluid. Musculoskeletal: Transitional lumbosacral anatomy with sacralized L5 level. No acute osseous abnormality identified. Review of the MIP images confirms the above findings. IMPRESSION: 1. Negative for aortic dissection or aneurysm. Positive for Aortic Atherosclerosis (ICD10-I70.0), from the renal artery level distally. And positive for calcified coronary artery atherosclerosis. Major arterial structures remain patent. 2. No acute or inflammatory process identified in the chest, abdomen, or pelvis. 3. Mild chronic lung disease. Diverticulosis of the sigmoid colon. Questionable punctate nephrolithiasis. Chronic T5 compression fracture. Electronically Signed   By: Genevie Ann M.D.   On: 04/09/2020 22:10    SIGNED: Deatra James, MD, FACP, FHM. [facility],  Pager (please use [email or web address] to page/text)  If 7PM-7AM, please contact night-coverage [email or web address], Password [facility] 04/10/2020, 7:04 AM

## 2020-04-10 NOTE — Interval H&P Note (Signed)
History and Physical Interval Note:  04/10/2020 12:52 PM  Ronald Cameron  has presented today for surgery, with the diagnosis of chest pain.  The various methods of treatment have been discussed with the patient and family. After consideration of risks, benefits and other options for treatment, the patient has consented to  Procedure(s): LEFT HEART CATH AND CORONARY ANGIOGRAPHY (N/A) as a surgical intervention.  The patient's history has been reviewed, patient examined, no change in status, stable for surgery.  I have reviewed the patient's chart and labs.  Questions were answered to the patient's satisfaction.   Cath Lab Visit (complete for each Cath Lab visit)  Clinical Evaluation Leading to the Procedure:   ACS: Yes.    Non-ACS:    Anginal Classification: CCS III  Anti-ischemic medical therapy: No Therapy  Non-Invasive Test Results: No non-invasive testing performed  Prior CABG: No previous CABG        Ronald Cameron [facility] 04/10/2020 12:52 PM

## 2020-04-10 NOTE — H&P (Addendum)
History and Physical    Ronald Cameron MHD:[id number] DOB: 1956/02/10 DOA: 04/09/2020  PCP: Claretta Fraise, MD   Patient coming from: Home   Chief Complaint: Palpitations, chest discomfort, dizziness   HPI: Ronald Cameron is a 64 y.o. male with medical history significant for rheumatoid arthritis, insomnia, chronic pain, and recent treatment for diverticulitis, now presenting to the emergency department for evaluation of palpitations and chest discomfort.  Patient reports that approximately 1 week ago, he woke from sleep with sensation that his heart was racing.  This resolved spontaneously after approximately 10 minutes but he has had similar episodes multiple times since then.  He has also developed some chest discomfort without palpitations that radiates towards the left arm.  He cannot identify any alleviating or exacerbating factors for this and denies any associated shortness of breath, nausea, or diaphoresis.  Patient also reports some lightheadedness when standing up earlier.  Denies focal numbness or weakness, and denies headache.  He reports being treated for acute diverticulitis 1 month ago, notes that the abdominal pain has resolved, but continues to have some nausea.    ED Course: Upon arrival to the ED, patient is found to be afebrile, saturating well on room air, and with stable blood pressure.  EKG features a sinus rhythm with inferolateral T wave inversions.  Chest x-ray with mild bronchial thickening.  Noncontrast head CT negative for acute intracranial abnormality.  CTA chest/abdomen/pelvis negative for aortic dissection or aneurysm.  CTA neck is concerning for severe near occlusive atheromatous stenosis involving the left ICA.  COVID-19 screening test not yet resulted.  Hospitalists consulted for admission.  Review of Systems:  All other systems reviewed and apart from HPI, are negative.  Past Medical History:  Diagnosis Date  . Arthritis   . Bowel obstruction  ([place])   . Cataract   . Collagen vascular disease ([facility])    Rheumatoid Arthritis  . Colon polyps   . Diverticulosis   . Kidney stones   . Rheumatoid arthritis [facility])     Past Surgical History:  Procedure Laterality Date  . CATARACT EXTRACTION, BILATERAL    . COLONOSCOPY N/A 04/13/2016   Dr. [name]: 14 mm polyp in the ascending colon removed, diverticulosis in the distal descending colon and sigmoid colon, nonbleeding internal hemorrhoids. Pathology-tubular adenoma. Next colonoscopy 5 years.  . ELBOW SURGERY     X 3 last year. Staph infection but not MRSA per patient. infectious disease. out of work 6 months.   . TONSILLECTOMY       reports that he quit smoking about 5 years ago. His smoking use included cigarettes. He started smoking about 44 years ago. He has a 39.00 pack-year smoking history. He has never used smokeless tobacco. He reports that he does not drink alcohol and does not use drugs.  No Known Allergies  Family History  Problem Relation Age of Onset  . Heart attack Father   . Heart disease Father   . Diabetes Mother   . Congestive Heart Failure Mother   . Heart disease Sister   . Heart disease Brother   . Heart disease Sister   . Colon cancer Neg Hx   . Liver disease Neg Hx      Prior to Admission medications   Medication Sig Start Date End Date Taking? Authorizing Provider  alendronate (FOSAMAX) 70 MG tablet Take 70 mg by mouth once a week.  09/12/19  Yes [provider]  Ascorbic Acid (VITAMIN C) 1000 MG tablet Take 1,000  mg by mouth daily.   Yes [provider]  aspirin 81 MG tablet Take 81 mg by mouth daily.   Yes [provider]  Calcium Carb-Cholecalciferol (CALCIUM-VITAMIN D) 600-400 MG-UNIT TABS Take 1 tablet by mouth daily. 1200mg  daily   Yes [provider]  folic acid (FOLVITE) 1 MG tablet Take 1 mg by mouth daily.   Yes [provider]  Methotrexate Sodium (METHOTREXATE, PF,) 50 MG/2ML injection Inject 12.5  mg into the muscle once a week.  09/12/17  Yes [provider]  predniSONE (DELTASONE) 10 MG tablet Take 10 mg by mouth daily. 04/01/20  Yes [provider]  Tofacitinib Citrate ER (XELJANZ XR) 11 MG TB24 Take 11 mg by mouth daily.   Yes [provider]  traMADol (ULTRAM) 50 MG tablet Take 1 tablet (50 mg total) by mouth every 6 (six) hours as needed. Patient taking differently: Take 50-100 mg by mouth every 6 (six) hours as needed for moderate pain.  08/31/17  Yes Milton Ferguson, MD  traZODone (DESYREL) 50 MG tablet Take 50 mg by mouth at bedtime as needed for sleep.    Yes [provider]  tamsulosin (FLOMAX) 0.4 MG CAPS capsule Take 1 capsule (0.4 mg total) by mouth daily. Patient not taking: Reported on 0/17/5102 5/85/27   Delora Fuel, MD    Physical Exam: Vitals:   04/09/20 2302 04/09/20 2315 04/09/20 2330 04/09/20 2345  BP: (!) 150/78 (!) 147/75 130/81 131/76  Pulse: (!) 58 (!) 56 (!) 54 (!) 53  Resp: 12 10 11 11   Temp: 98.4 F (36.9 C)     TempSrc: Oral     SpO2: 100% 98% 99% 96%  Weight:      Height:        Constitutional: NAD, calm  Eyes: PERTLA, lids and conjunctivae normal ENMT: Mucous membranes are moist. Posterior pharynx clear of any exudate or lesions.   Neck: normal, supple, no masses, no thyromegaly Respiratory: no wheezing, no crackles. No accessory muscle use.  Cardiovascular: S1 & S2 heard, regular rate and rhythm. No extremity edema.  Abdomen: No distension, no tenderness, soft. Bowel sounds active.  Musculoskeletal: no clubbing / cyanosis. No joint deformity upper and lower extremities.   Skin: no significant rashes, lesions, ulcers. Warm, dry, well-perfused. Neurologic: CN 2-12 grossly intact. Sensation intact. Strength 5/5 in all 4 limbs.  Psychiatric: Alert and oriented to person, place, and situation. Pleasant and cooperative.    Labs and Imaging on Admission: I have personally reviewed following labs and imaging  studies  CBC: Recent Labs  Lab 04/09/20 1536  WBC 8.8  HGB 14.6  HCT 44.2  MCV 97.1  PLT 782*   Basic Metabolic Panel: Recent Labs  Lab 04/09/20 1536  NA 138  K 3.3*  CL 107  CO2 21*  GLUCOSE 134*  BUN 18  CREATININE 1.20  CALCIUM 8.5*   GFR: Estimated Creatinine Clearance: 78 mL/min (by C-G formula based on SCr of 1.2 mg/dL). Liver Function Tests: No results for input(s): AST, ALT, ALKPHOS, BILITOT, PROT, ALBUMIN in the last 168 hours. No results for input(s): LIPASE, AMYLASE in the last 168 hours. No results for input(s): AMMONIA in the last 168 hours. Coagulation Profile: No results for input(s): INR, PROTIME in the last 168 hours. Cardiac Enzymes: No results for input(s): CKTOTAL, CKMB, CKMBINDEX, TROPONINI in the last 168 hours. BNP (last 3 results) No results for input(s): PROBNP in the last 8760 hours. HbA1C: No results for input(s): HGBA1C in the  last 72 hours. CBG: No results for input(s): GLUCAP in the last 168 hours. Lipid Profile: No results for input(s): CHOL, HDL, LDLCALC, TRIG, CHOLHDL, LDLDIRECT in the last 72 hours. Thyroid Function Tests: No results for input(s): TSH, T4TOTAL, FREET4, T3FREE, THYROIDAB in the last 72 hours. Anemia Panel: No results for input(s): VITAMINB12, FOLATE, FERRITIN, TIBC, IRON, RETICCTPCT in the last 72 hours. Urine analysis:    Component Value Date/Time   COLORURINE YELLOW 02/03/2018 2151   APPEARANCEUR CLEAR 02/03/2018 2151   LABSPEC 1.027 02/03/2018 2151   PHURINE 6.0 02/03/2018 2151   GLUCOSEU NEGATIVE 02/03/2018 2151   HGBUR MODERATE (A) 02/03/2018 2151   BILIRUBINUR NEGATIVE 02/03/2018 2151   KETONESUR 5 (A) 02/03/2018 2151   PROTEINUR 30 (A) 02/03/2018 2151   NITRITE NEGATIVE 02/03/2018 2151   LEUKOCYTESUR NEGATIVE 02/03/2018 2151   Sepsis Labs: @LABRCNTIP (procalcitonin:4,lacticidven:4) )No results found for this or any previous visit (from the past 240 hour(s)).   Radiological Exams on Admission: DG  Chest 2 View  Result Date: 04/09/2020 CLINICAL DATA:  Chest pain and dizziness. EXAM: CHEST - 2 VIEW COMPARISON:  Remote exam 10/18/2003 FINDINGS: Stable upper normal heart size.The cardiomediastinal contours are normal. Mild bronchial thickening. Pulmonary vasculature is normal. No consolidation, pleural effusion, or pneumothorax. No acute osseous abnormalities are seen. IMPRESSION: Mild bronchial thickening. Electronically Signed   By: Keith Rake M.D.   On: 04/09/2020 16:05   CT Head Wo Contrast  Result Date: 04/09/2020 CLINICAL DATA:  Headache and dizziness. EXAM: CT HEAD WITHOUT CONTRAST TECHNIQUE: Contiguous axial images were obtained from the base of the skull through the vertex without intravenous contrast. COMPARISON:  12/04/2016 FINDINGS: Brain: No intracranial hemorrhage, mass effect, or midline shift. No hydrocephalus. Brain volume is normal for age. The basilar cisterns are patent. No evidence of territorial infarct or acute ischemia. No extra-axial or intracranial fluid collection. Vascular: Atherosclerosis of skullbase vasculature without hyperdense vessel or abnormal calcification. Skull: No fracture or focal lesion. Sinuses/Orbits: Paranasal sinuses and mastoid air cells are clear. The visualized orbits are unremarkable. Bilateral lens extraction. Other: None. IMPRESSION: No acute intracranial abnormality. Electronically Signed   By: Keith Rake M.D.   On: 04/09/2020 21:47   CT Angio Neck W and/or Wo Contrast  Result Date: 04/09/2020 CLINICAL DATA:  Initial evaluation for acute headache, dizziness. EXAM: CT ANGIOGRAPHY NECK TECHNIQUE: Multidetector CT imaging of the neck was performed using the standard protocol during bolus administration of intravenous contrast. Multiplanar CT image reconstructions and MIPs were obtained to evaluate the vascular anatomy. Carotid stenosis measurements (when applicable) are obtained utilizing NASCET criteria, using the distal internal carotid  diameter as the denominator. CONTRAST:  10mL OMNIPAQUE IOHEXOL 350 MG/ML SOLN COMPARISON:  Prior head CT from earlier the same day. FINDINGS: Aortic arch: Visualized aortic arch of normal caliber with normal branch pattern. Mild atheromatous change within the arch itself. No high-grade stenosis about the origin of the great vessels. Right carotid system: Mild scattered nonstenotic plaque present within the right common carotid artery without significant stenosis. Bulky mixed plaque about the right bifurcation/proximal right ICA with associated stenosis of up to 35% by NASCET criteria. Right ICA otherwise patent without stenosis, dissection or occlusion. Left carotid system: Eccentric soft plaque seen throughout the left CCA without high-grade stenosis. Extensive predominantly soft plaque about the left bifurcation/proximal left ICA with associated severe near occlusive stenosis (series 7, image 122). A radiographic string sign is present. Area of involvement begins at the left bifurcation and measures approximately 19 mm in length.  Distally, left ICA patent without stenosis, dissection, or occlusion. Vertebral arteries: Both vertebral arteries arise from the subclavian arteries. Short-segment stenosis of approximately 65% noted within the proximal left subclavian artery prior to the takeoff of the left vertebral artery (series 8, image 113). Atheromatous plaque at the origin of the left vertebral artery with estimated 50% stenosis. Mild plaque at the origin of the right vertebral artery without significant stenosis. Right vertebral artery is dominant. Additional scattered calcified plaque within the proximal V1/V2 segments bilaterally without high-grade stenosis. Vertebral arteries otherwise patent without stenosis, dissection, or occlusion. Skeleton: No acute osseous abnormality. No discrete or worrisome osseous lesions. Other neck: No other acute soft tissue abnormality within the neck. No mass lesion or  adenopathy. Few subcentimeter hypodense thyroid nodules measuring up to 4 mm noted, felt to be of doubtful significance given size and patient age. No follow-up imaging recommended regarding these lesions. Upper chest: Chronic fibrotic changes noted within the visualized lungs, better evaluated on concomitant chest CT. IMPRESSION: 1. Negative CTA for acute dissection or other abnormality. 2. 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 Vascular surgery consultation and referral recommended for further evaluation and consideration of possible CEA. 3. Bulky mixed plaque about the right carotid bifurcation/proximal right ICA with associated stenosis of up to 35% by NASCET criteria. 4. 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 5. 50% atheromatous stenosis at the origin of the left vertebral artery. Right vertebral artery is dominant. Electronically Signed   By: Jeannine Boga M.D.   On: 04/09/2020 23:01   CT Angio Chest/Abd/Pel for Dissection W and/or Wo Contrast  Result Date: 04/09/2020 CLINICAL DATA:  64 year old male with chest and back pain. Dizziness. EXAM: CT ANGIOGRAPHY CHEST, ABDOMEN AND PELVIS TECHNIQUE: Multidetector CT imaging through the chest, abdomen and pelvis was performed using the standard protocol during bolus administration of intravenous contrast. Multiplanar reconstructed images and MIPs were obtained and reviewed to evaluate the vascular anatomy. CONTRAST:  53mL OMNIPAQUE IOHEXOL 350 MG/ML SOLN COMPARISON:  Noncontrast CT Abdomen and Pelvis 02/04/2018. CTA abdomen and pelvis 09/02/2017. Thoracic spine CT 10/19/2003 FINDINGS: CTA CHEST FINDINGS Cardiovascular: Calcified coronary artery atherosclerosis. Comparatively mild calcified plaque of the thoracic aorta. Negative for thoracic aortic dissection or aneurysm. Mild central pulmonary artery enlargement. Bilateral central pulmonary arteries also appear to  be patent. Mild cardiomegaly. No pericardial effusion. Mediastinum/Nodes: Negative.  No lymphadenopathy. Lungs/Pleura: Major airways are patent. Larger lung volumes compared to 2018. Scattered mild bilateral subpleural reticular and/or ground-glass opacity which has a chronic postinflammatory appearance. No pleural effusion. There is a stable small right lower lobe lung nodule since 09/02/2017 (benign) on series 5, image 45. Musculoskeletal: Chronic T5 compression fracture demonstrated in 2005. Stable mild T11 endplate irregularity since 2018. No acute osseous abnormality identified. Review of the MIP images confirms the above findings. CTA ABDOMEN AND PELVIS FINDINGS VASCULAR From the renal arteries distally there is extensive Aortoiliac calcified atherosclerosis. But the major arterial structures remain patent in the abdomen and pelvis. Negative for aneurysm or dissection. The celiac origin and SMA origin remain normal. No mesenteric atherosclerosis identified. The IMA is patent. Review of the MIP images confirms the above findings. NON-VASCULAR Hepatobiliary: Negative liver and gallbladder. Pancreas: Negative aside from some fatty pancreatic atrophy. Spleen: Negative. Adrenals/Urinary Tract: Normal adrenal glands. Bilateral renal enhancement is symmetric and normal. No hydronephrosis. Normal ureters to the bladder. Unremarkable urinary bladder. Renal vascular calcification suspected, nephrolithiasis felt less likely. Stomach/Bowel: Diverticulosis of the sigmoid colon  with no active inflammation. Occasional descending colon diverticula without inflammation. Normal retrocecal appendix (series 7, image 184). No large bowel inflammation. Negative terminal ileum. No dilated small bowel. Decompressed stomach. Negative duodenum. No free air, free fluid, or mesenteric stranding. Lymphatic: No lymphadenopathy. Reproductive: Small chronic fat containing left inguinal hernia, otherwise negative. Other: No pelvic free fluid.  Musculoskeletal: Transitional lumbosacral anatomy with sacralized L5 level. No acute osseous abnormality identified. Review of the MIP images confirms the above findings. IMPRESSION: 1. Negative for aortic dissection or aneurysm. Positive for Aortic Atherosclerosis (ICD10-I70.0), from the renal artery level distally. And positive for calcified coronary artery atherosclerosis. Major arterial structures remain patent. 2. No acute or inflammatory process identified in the chest, abdomen, or pelvis. 3. Mild chronic lung disease. Diverticulosis of the sigmoid colon. Questionable punctate nephrolithiasis. Chronic T5 compression fracture. Electronically Signed   By: Genevie Ann M.D.   On: 04/09/2020 22:10    EKG: Independently reviewed. Sinus rhythm, T-wave inversions.   Assessment/Plan   1. Chest pain, palpitations  - Presents with one week of intermittent rapid palpitations and chest discomfort  - EKG with T-wave inversions, HS troponin normal x2, and no acute aortic pathology on CTA chest/abd/pelvis  - Continue cardiac monitoring, replace potassium, continue ASA, start statin, check lipids    2. Carotid artery stenosis  - Near-occlusive atheromatous stenosis involving left ICA noted on CTA in ED  - He reports recent dizziness but no focal deficit  - Continue ASA, start statin, will need vascular surgery consultation    3. Hypokalemia  - Replace, repeat chem panel in am    4. Rheumatoid arthritis  - Stable, managed with Xeljanz, prednisone, and methotrexate     DVT prophylaxis: Lovenox  Code Status: Full  Family Communication: Discussed with patient  Disposition Plan:  Patient is from: Home  Anticipated d/c is to: Home  Anticipated d/c date is: Possibly as early as 04/11/20 Patient currently: Pending further evaluation of chest pain  Consults called: None  Admission status: Observation     Vianne Bulls, MD [facility]  04/10/2020, 12:20 AM

## 2020-04-10 NOTE — Consult Note (Addendum)
Hospital Consult    Reason for Consult: left ICA stenosis Requesting Physician: Mitzi Hansen, MD MRN #:  [id number]  History of Present Illness: This is a 64 y.o. male with past medical history significant for RA, Chronic pain, and Diverticulitis who presented yesterday to the ED after experiencing palpitations with chest pain radiating to the left side of his neck, left parietal/ occipital headache, left eye blurriness and tingling down his left arm. He says by the time he arrived at the ED this had mostly subsided. He had a similar episode of waking up in the middle of the night feeling like his heart was racing approximately 2 weeks ago. At that time he did not have any other associated symptoms. Presently he is still having the left neck pain and left upper arm tingling. He also reports some dizziness when he moves to quickly which is new for him. He otherwise denies any shortness of breath, chest pain, numbness or weakness of upper or lower extremities, slurred speech, amaurosis fugax, or facial drooping. He states that he has always been told that he has great blood pressure. He is a former smoker. He reports extensive family history of CAD/ PAD.  In the ED he had a Noncontrast head CT negative for acute intracranial abnormality.  CTA chest/abdomen/pelvis negative for aortic dissection or aneurysm.  CTA neck is concerning for severe near occlusive atheromatous stenosis involving the left ICA with a string sign. Echo is pending.  He takes Aspirin 81 mg daily. He has been started on statin  Vascular surgery has been asked to evaluate him due to his severe left ICA stenosis  Past Medical History:  Diagnosis Date  . Arthritis   . Bowel obstruction ([place])   . Cataract   . Collagen vascular disease (HCC)    Rheumatoid Arthritis  . Colon polyps   . Diverticulosis   . Kidney stones   . Rheumatoid arthritis [facility])     Past Surgical History:  Procedure Laterality Date  . CATARACT EXTRACTION,  BILATERAL    . COLONOSCOPY N/A 04/13/2016   Dr. [name]: 14 mm polyp in the ascending colon removed, diverticulosis in the distal descending colon and sigmoid colon, nonbleeding internal hemorrhoids. Pathology-tubular adenoma. Next colonoscopy 5 years.  . ELBOW SURGERY     X 3 last year. Staph infection but not MRSA per patient. infectious disease. out of work 6 months.   . TONSILLECTOMY      No Known Allergies  Prior to Admission medications   Medication Sig Start Date End Date Taking? Authorizing Provider  alendronate (FOSAMAX) 70 MG tablet Take 70 mg by mouth once a week.  09/12/19  Yes [provider]  Ascorbic Acid (VITAMIN C) 1000 MG tablet Take 1,000 mg by mouth daily.   Yes [provider]  aspirin 81 MG tablet Take 81 mg by mouth daily.   Yes [provider]  Calcium Carb-Cholecalciferol (CALCIUM-VITAMIN D) 600-400 MG-UNIT TABS Take 1 tablet by mouth daily. 1200mg  daily   Yes [provider]  folic acid (FOLVITE) 1 MG tablet Take 1 mg by mouth daily.   Yes [provider]  Methotrexate Sodium (METHOTREXATE, PF,) 50 MG/2ML injection Inject 12.5 mg into the muscle once a week.  09/12/17  Yes [provider]  predniSONE (DELTASONE) 10 MG tablet Take 10 mg by mouth daily. 04/01/20  Yes [provider]  Tofacitinib Citrate ER (XELJANZ XR) 11 MG TB24 Take 11 mg by mouth daily.   Yes [provider]  traMADol (ULTRAM) 50 MG tablet Take 1 tablet (50 mg total) by mouth every 6 (six) hours as needed. Patient taking differently: Take 50-100 mg by mouth every 6 (six) hours as needed for moderate pain.  08/31/17  Yes Milton Ferguson, MD  traZODone (DESYREL) 50 MG tablet Take 50 mg by mouth at bedtime as needed for sleep.    Yes [provider]  tamsulosin (FLOMAX) 0.4 MG CAPS capsule Take 1 capsule (0.4 mg total) by mouth daily. Patient not taking: Reported on 2/44/0102 04/20/35   Delora Fuel, MD    Social History    Socioeconomic History  . Marital status: Married    Spouse name: Not on file  . Number of children: 0  . Years of education: Not on file  . Highest education level: 12th grade  Occupational History  . Occupation: Disbility  Tobacco Use  . Smoking status: Former Smoker    Packs/day: 1.00    Years: 39.00    Pack years: 39.00    Types: Cigarettes    Start date: 07/11/1975    Quit date: 04/26/2014    Years since quitting: 5.9  . Smokeless tobacco: Never Used  . Tobacco comment: Quit x 3 years  Vaping Use  . Vaping Use: Never used  Substance and Sexual Activity  . Alcohol use: No    Alcohol/week: 0.0 standard drinks  . Drug use: No  . Sexual activity: Not Currently  Other Topics Concern  . Not on file  Social History Narrative  . Not on file   Social Determinants of Health   Financial Resource Strain:   . Difficulty of Paying Living Expenses:   Food Insecurity:   . Worried About Charity fundraiser in the Last Year:   . Arboriculturist in the Last Year:   Transportation Needs:   . Film/video editor (Medical):   Marland Kitchen Lack of Transportation (Non-Medical):   Physical Activity:   . Days of Exercise per Week:   . Minutes of Exercise per Session:   Stress:   . Feeling of Stress :   Social Connections:   . Frequency of Communication with Friends and Family:   . Frequency of Social Gatherings with Friends and Family:   . Attends Religious Services:   . Active Member of Clubs or Organizations:   . Attends Archivist Meetings:   Marland Kitchen Marital Status:   Intimate Partner Violence:   . Fear of Current or Ex-Partner:   . Emotionally Abused:   Marland Kitchen Physically Abused:   . Sexually Abused:      Family History  Problem Relation Age of Onset  . Heart attack Father   . Heart disease Father   . Diabetes Mother   . Congestive Heart Failure Mother   . Heart disease Sister   . Heart disease Brother   . Heart disease Sister   . Colon cancer Neg Hx   . Liver disease Neg  Hx     ROS: Otherwise negative unless mentioned in HPI  Physical Examination  Vitals:   04/10/20 0848 04/10/20 0925  BP:  (!) 125/112  Pulse:  (!) 59  Resp:  14  Temp: 97.7 F (36.5 C) 98.6 F (37 C)  SpO2:  94%   Body mass index is 30.82 kg/m.  General:  WDWN in NAD Gait: Not observed HENT: WNL, normocephalic Pulmonary: normal non-labored breathing, without wheezing Cardiac: regular, without  Murmurs, rubs or gallops; without carotid bruits Abdomen:  soft, NT/ND,  no masses Vascular Exam/Pulses: 2+ radial pulses, 2+ femoral pulses, 2+ DP pulses bilaterally Extremities: without ischemic changes, without Gangrene , without cellulitis; without open wounds;  Musculoskeletal: no muscle wasting or atrophy  Neurologic: A&O X 3;  No focal weakness or paresthesias are detected; speech is fluent/normal, tongue midline, smile symmetric Psychiatric:  The pt has Normal affect.  CBC    Component Value Date/Time   WBC 8.2 04/10/2020 0436   RBC 4.33 04/10/2020 0436   HGB 13.9 04/10/2020 0436   HCT 43.3 04/10/2020 0436   PLT 436 (H) 04/10/2020 0436   MCV 100.0 04/10/2020 0436   MCH 32.1 04/10/2020 0436   MCHC 32.1 04/10/2020 0436   RDW 14.5 04/10/2020 0436   LYMPHSABS 1.1 09/02/2017 0546   MONOABS 1.0 09/02/2017 0546   EOSABS 0.1 09/02/2017 0546   BASOSABS 0.0 09/02/2017 0546    BMET    Component Value Date/Time   NA 135 04/10/2020 0436   K 3.8 04/10/2020 0436   CL 110 04/10/2020 0436   CO2 17 (L) 04/10/2020 0436   GLUCOSE 108 (H) 04/10/2020 0436   BUN 17 04/10/2020 0436   CREATININE 0.99 04/10/2020 0436   CALCIUM 8.1 (L) 04/10/2020 0436   GFRNONAA >60 04/10/2020 0436   GFRAA >60 04/10/2020 0436    COAGS: No results found for: INR, PROTIME   Non-Invasive Vascular Imaging:   CT Head wo Contrast: IMPRESSION: No acute intracranial abnormality.  CT Angio chest/abd/ pel for dissection IMPRESSION: 1. Negative for aortic dissection or aneurysm. Positive for  Aortic Atherosclerosis (ICD10-I70.0), from the renal artery level distally. And positive for calcified coronary artery atherosclerosis. Major arterial structures remain patent.  2. No acute or inflammatory process identified in the chest, abdomen, or pelvis.  3. Mild chronic lung disease. Diverticulosis of the sigmoid colon. Questionable punctate nephrolithiasis. Chronic T5 compression fracture.   CT Angio Neck W or Wo Contrast: IMPRESSION: 1. Negative CTA for acute dissection or other abnormality. 2. 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 Vascular surgery consultation and referral recommended for further evaluation and consideration of possible CEA. 3. Bulky mixed plaque about the right carotid bifurcation/proximal right ICA with associated stenosis of up to 35% by NASCET criteria. 4. 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 5. 50% atheromatous stenosis at the origin of the left vertebral artery. Right vertebral artery is dominant.  Statin:  Yes.   Beta Blocker:  No. Aspirin:  Yes.   ACEI:  No. ARB:  No. CCB use:  No Other antiplatelets/anticoagulants:  No.    ASSESSMENT/PLAN: This is a 64 y.o. male with high grade left ICA stenosis on CTA. Some of his left upper extremity symptoms may be associated with his left subclavian stenosis noted on CTA but his upper extremities are well perfused. Patient is neurologically intact. He has some mild dizziness and left sided headache otherwise does not have symptoms that are likely associated with his carotid stenosis. He will need carotid revascularization CEA vs TCAR  - Continued adequate blood pressure control  - Cardiology was consulted as well and they are planning a LHC - Carotid duplex ordered to further evaluate his Left ICA stenosis- pending results - Continue ASA and Statin - Vascular surgeon on call Dr. Oneida Alar will see the patient  later today for further surgical evaluation and planning     - DVT prophylaxis: Lovenox   Corrina Baglia PA-C [facility] [phone] 04/10/2020  10:22 AM  Agree with above.   Neuro: UE:E 5/5/ motor no facial asymmetry Short neck obese  Cath results 60% RCA med mgmt  CT images reviewed 65% left subclavian 50% vert, no real right ICA stenosis,    I do not believe his headache or neck pain are related to his carotid stenosis.  He has > 80% left ICA stenosis with relatively high lesion extending to C2 level.   He needs left TCAR stent  Will start plavix today in addition to his cholesterol med and aspirin. Can d/c home Procedure will be scheduled for 04/21/20 Tuesday.  Risk benefits complications procedure details including but not limited to bleeding infection stroke MI discussed as well as options of stent and CEA with advantages and disadvantages.  Wife was present for discussion.  He wishes to proceed.  He will need right side Aline due to left subclavian stenosis  I do not believe he needs any subclavian intervention at this point as this is asymptomatic. Right vert is patent and dominant so doubt this would cause dizziness  Ruta Hinds, MD [facility] [place] Office: [phone]

## 2020-04-10 NOTE — Progress Notes (Signed)
PHARMACIST - PHYSICIAN COMMUNICATION  CONCERNING: P&T Medication Policy Regarding Oral Bisphosphonates  RECOMMENDATION: Your order for alendronate (Fosamax), ibandronate (Boniva), or risedronate (Actonel) has been discontinued at this time.  If the patient's post-hospital medical condition warrants safe use of this class of drugs, please resume the pre-hospital regimen upon discharge.  DESCRIPTION:  Alendronate (Fosamax), ibandronate (Boniva), and risedronate (Actonel) can cause severe esophageal erosions in patients who are unable to remain upright at least 30 minutes after taking this medication.   Since brief interruptions in therapy are thought to have minimal impact on bone mineral density, the [facility] has established that bisphosphonate orders should be routinely discontinued during hospitalization.   To override this safety policy and permit administration of Boniva, Fosamax, or Actonel in the hospital, prescribers must write "DO NOT HOLD" in the comments section when placing the order for this class of medications.  Sherlon Handing, PharmD, BCPS Please see amion for complete clinical pharmacist phone list. 04/10/2020 12:34 AM

## 2020-04-10 NOTE — H&P (View-Only) (Signed)
Hospital Consult    Reason for Consult: left ICA stenosis Requesting Physician: Mitzi Hansen, MD MRN #:  [id number]  History of Present Illness: This is a 64 y.o. male with past medical history significant for RA, Chronic pain, and Diverticulitis who presented yesterday to the ED after experiencing palpitations with chest pain radiating to the left side of his neck, left parietal/ occipital headache, left eye blurriness and tingling down his left arm. He says by the time he arrived at the ED this had mostly subsided. He had a similar episode of waking up in the middle of the night feeling like his heart was racing approximately 2 weeks ago. At that time he did not have any other associated symptoms. Presently he is still having the left neck pain and left upper arm tingling. He also reports some dizziness when he moves to quickly which is new for him. He otherwise denies any shortness of breath, chest pain, numbness or weakness of upper or lower extremities, slurred speech, amaurosis fugax, or facial drooping. He states that he has always been told that he has great blood pressure. He is a former smoker. He reports extensive family history of CAD/ PAD.  In the ED he had a Noncontrast head CT negative for acute intracranial abnormality.  CTA chest/abdomen/pelvis negative for aortic dissection or aneurysm.  CTA neck is concerning for severe near occlusive atheromatous stenosis involving the left ICA with a string sign. Echo is pending.  He takes Aspirin 81 mg daily. He has been started on statin  Vascular surgery has been asked to evaluate him due to his severe left ICA stenosis  Past Medical History:  Diagnosis Date  . Arthritis   . Bowel obstruction ([place])   . Cataract   . Collagen vascular disease (HCC)    Rheumatoid Arthritis  . Colon polyps   . Diverticulosis   . Kidney stones   . Rheumatoid arthritis [facility])     Past Surgical History:  Procedure Laterality Date  . CATARACT EXTRACTION,  BILATERAL    . COLONOSCOPY N/A 04/13/2016   Dr. [name]: 14 mm polyp in the ascending colon removed, diverticulosis in the distal descending colon and sigmoid colon, nonbleeding internal hemorrhoids. Pathology-tubular adenoma. Next colonoscopy 5 years.  . ELBOW SURGERY     X 3 last year. Staph infection but not MRSA per patient. infectious disease. out of work 6 months.   . TONSILLECTOMY      No Known Allergies  Prior to Admission medications   Medication Sig Start Date End Date Taking? Authorizing Provider  alendronate (FOSAMAX) 70 MG tablet Take 70 mg by mouth once a week.  09/12/19  Yes [provider]  Ascorbic Acid (VITAMIN C) 1000 MG tablet Take 1,000 mg by mouth daily.   Yes [provider]  aspirin 81 MG tablet Take 81 mg by mouth daily.   Yes [provider]  Calcium Carb-Cholecalciferol (CALCIUM-VITAMIN D) 600-400 MG-UNIT TABS Take 1 tablet by mouth daily. 1200mg  daily   Yes [provider]  folic acid (FOLVITE) 1 MG tablet Take 1 mg by mouth daily.   Yes [provider]  Methotrexate Sodium (METHOTREXATE, PF,) 50 MG/2ML injection Inject 12.5 mg into the muscle once a week.  09/12/17  Yes [provider]  predniSONE (DELTASONE) 10 MG tablet Take 10 mg by mouth daily. 04/01/20  Yes [provider]  Tofacitinib Citrate ER (XELJANZ XR) 11 MG TB24 Take 11 mg by mouth daily.   Yes [provider]  traMADol (ULTRAM) 50 MG tablet Take 1 tablet (50 mg total) by mouth every 6 (six) hours as needed. Patient taking differently: Take 50-100 mg by mouth every 6 (six) hours as needed for moderate pain.  08/31/17  Yes Milton Ferguson, MD  traZODone (DESYREL) 50 MG tablet Take 50 mg by mouth at bedtime as needed for sleep.    Yes [provider]  tamsulosin (FLOMAX) 0.4 MG CAPS capsule Take 1 capsule (0.4 mg total) by mouth daily. Patient not taking: Reported on 8/58/8502 7/74/12   Delora Fuel, MD    Social History    Socioeconomic History  . Marital status: Married    Spouse name: Not on file  . Number of children: 0  . Years of education: Not on file  . Highest education level: 12th grade  Occupational History  . Occupation: Disbility  Tobacco Use  . Smoking status: Former Smoker    Packs/day: 1.00    Years: 39.00    Pack years: 39.00    Types: Cigarettes    Start date: 07/11/1975    Quit date: 04/26/2014    Years since quitting: 5.9  . Smokeless tobacco: Never Used  . Tobacco comment: Quit x 3 years  Vaping Use  . Vaping Use: Never used  Substance and Sexual Activity  . Alcohol use: No    Alcohol/week: 0.0 standard drinks  . Drug use: No  . Sexual activity: Not Currently  Other Topics Concern  . Not on file  Social History Narrative  . Not on file   Social Determinants of Health   Financial Resource Strain:   . Difficulty of Paying Living Expenses:   Food Insecurity:   . Worried About Charity fundraiser in the Last Year:   . Arboriculturist in the Last Year:   Transportation Needs:   . Film/video editor (Medical):   Marland Kitchen Lack of Transportation (Non-Medical):   Physical Activity:   . Days of Exercise per Week:   . Minutes of Exercise per Session:   Stress:   . Feeling of Stress :   Social Connections:   . Frequency of Communication with Friends and Family:   . Frequency of Social Gatherings with Friends and Family:   . Attends Religious Services:   . Active Member of Clubs or Organizations:   . Attends Archivist Meetings:   Marland Kitchen Marital Status:   Intimate Partner Violence:   . Fear of Current or Ex-Partner:   . Emotionally Abused:   Marland Kitchen Physically Abused:   . Sexually Abused:      Family History  Problem Relation Age of Onset  . Heart attack Father   . Heart disease Father   . Diabetes Mother   . Congestive Heart Failure Mother   . Heart disease Sister   . Heart disease Brother   . Heart disease Sister   . Colon cancer Neg Hx   . Liver disease Neg  Hx     ROS: Otherwise negative unless mentioned in HPI  Physical Examination  Vitals:   04/10/20 0848 04/10/20 0925  BP:  (!) 125/112  Pulse:  (!) 59  Resp:  14  Temp: 97.7 F (36.5 C) 98.6 F (37 C)  SpO2:  94%   Body mass index is 30.82 kg/m.  General:  WDWN in NAD Gait: Not observed HENT: WNL, normocephalic Pulmonary: normal non-labored breathing, without wheezing Cardiac: regular, without  Murmurs, rubs or gallops; without carotid bruits Abdomen:  soft, NT/ND,  no masses Vascular Exam/Pulses: 2+ radial pulses, 2+ femoral pulses, 2+ DP pulses bilaterally Extremities: without ischemic changes, without Gangrene , without cellulitis; without open wounds;  Musculoskeletal: no muscle wasting or atrophy  Neurologic: A&O X 3;  No focal weakness or paresthesias are detected; speech is fluent/normal, tongue midline, smile symmetric Psychiatric:  The pt has Normal affect.  CBC    Component Value Date/Time   WBC 8.2 04/10/2020 0436   RBC 4.33 04/10/2020 0436   HGB 13.9 04/10/2020 0436   HCT 43.3 04/10/2020 0436   PLT 436 (H) 04/10/2020 0436   MCV 100.0 04/10/2020 0436   MCH 32.1 04/10/2020 0436   MCHC 32.1 04/10/2020 0436   RDW 14.5 04/10/2020 0436   LYMPHSABS 1.1 09/02/2017 0546   MONOABS 1.0 09/02/2017 0546   EOSABS 0.1 09/02/2017 0546   BASOSABS 0.0 09/02/2017 0546    BMET    Component Value Date/Time   NA 135 04/10/2020 0436   K 3.8 04/10/2020 0436   CL 110 04/10/2020 0436   CO2 17 (L) 04/10/2020 0436   GLUCOSE 108 (H) 04/10/2020 0436   BUN 17 04/10/2020 0436   CREATININE 0.99 04/10/2020 0436   CALCIUM 8.1 (L) 04/10/2020 0436   GFRNONAA >60 04/10/2020 0436   GFRAA >60 04/10/2020 0436    COAGS: No results found for: INR, PROTIME   Non-Invasive Vascular Imaging:   CT Head wo Contrast: IMPRESSION: No acute intracranial abnormality.  CT Angio chest/abd/ pel for dissection IMPRESSION: 1. Negative for aortic dissection or aneurysm. Positive for  Aortic Atherosclerosis (ICD10-I70.0), from the renal artery level distally. And positive for calcified coronary artery atherosclerosis. Major arterial structures remain patent.  2. No acute or inflammatory process identified in the chest, abdomen, or pelvis.  3. Mild chronic lung disease. Diverticulosis of the sigmoid colon. Questionable punctate nephrolithiasis. Chronic T5 compression fracture.   CT Angio Neck W or Wo Contrast: IMPRESSION: 1. Negative CTA for acute dissection or other abnormality. 2. 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 Vascular surgery consultation and referral recommended for further evaluation and consideration of possible CEA. 3. Bulky mixed plaque about the right carotid bifurcation/proximal right ICA with associated stenosis of up to 35% by NASCET criteria. 4. 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 5. 50% atheromatous stenosis at the origin of the left vertebral artery. Right vertebral artery is dominant.  Statin:  Yes.   Beta Blocker:  No. Aspirin:  Yes.   ACEI:  No. ARB:  No. CCB use:  No Other antiplatelets/anticoagulants:  No.    ASSESSMENT/PLAN: This is a 64 y.o. male with high grade left ICA stenosis on CTA. Some of his left upper extremity symptoms may be associated with his left subclavian stenosis noted on CTA but his upper extremities are well perfused. Patient is neurologically intact. He has some mild dizziness and left sided headache otherwise does not have symptoms that are likely associated with his carotid stenosis. He will need carotid revascularization CEA vs TCAR  - Continued adequate blood pressure control  - Cardiology was consulted as well and they are planning a LHC - Carotid duplex ordered to further evaluate his Left ICA stenosis- pending results - Continue ASA and Statin - Vascular surgeon on call Dr. Oneida Alar will see the patient  later today for further surgical evaluation and planning     - DVT prophylaxis: Lovenox   Corrina Baglia PA-C [facility] [phone] 04/10/2020  10:22 AM  Agree with above.   Neuro: UE:E 5/5/ motor no facial asymmetry Short neck obese  Cath results 60% RCA med mgmt  CT images reviewed 65% left subclavian 50% vert, no real right ICA stenosis,    I do not believe his headache or neck pain are related to his carotid stenosis.  He has > 80% left ICA stenosis with relatively high lesion extending to C2 level.   He needs left TCAR stent  Will start plavix today in addition to his cholesterol med and aspirin. Can d/c home Procedure will be scheduled for 04/21/20 Tuesday.  Risk benefits complications procedure details including but not limited to bleeding infection stroke MI discussed as well as options of stent and CEA with advantages and disadvantages.  Wife was present for discussion.  He wishes to proceed.  He will need right side Aline due to left subclavian stenosis  I do not believe he needs any subclavian intervention at this point as this is asymptomatic. Right vert is patent and dominant so doubt this would cause dizziness  Ruta Hinds, MD [facility] [place] Office: [phone]

## 2020-04-10 NOTE — Progress Notes (Signed)
  Echocardiogram 2D Echocardiogram has been performed.  Ronald Cameron 04/10/2020, 10:36 AM

## 2020-04-10 NOTE — Consult Note (Addendum)
Cardiology Consultation:   Patient ID: Ronald Cameron; [id number]; [birth year]   Admit date: 04/09/2020 Date of Consult: 04/10/2020  Primary Care Provider: Claretta Fraise, MD Primary Cardiologist: Dr Harl Bowie   Patient Profile:   Ronald Cameron is a 64 y.o. male with a hx of coronary calcifications on CT with CV risk factors which include remote tobacco use and strong family hx of CAD, new HLD dx and new carotid artery disease dx who is being seen today for the evaluation of chest pain at the request of Dr. Myna Hidalgo.   History of Present Illness:   Ronald Cameron is a 64yo M with a hx as stated above who presented to [facility] on 04/09/20 with a two week hx of intermittent chest pain. He reports that two weeks ago he was awakened by an acute episode of anterior chest pain with radiation to his left arm and left jaw which resolved after 10 mins. He states that since that time the chest pain has come and gone at undetermined times occurring at least once per day. Along with the above symptoms, he was having dizziness and palpitations prior to ED presentation.   In the ED, EKG with NSR and inferolateral TWI and no ST changes. CXR with mild bronchial thickening. CTA chest with no acute changes. Neck CTA with severe near occlusive atheromatous stenosis involving the left ICA. Plan was for hospitalist admission with cardiology and vascular consultations.   He has been remotely followed by Dr. Harl Bowie for coronary calcifications and strong family hx of CAD. He remotely underwent an [facility] 03/2002 which showed non-obstructive CAD with patent LAD, 30% 1st OM, 50-60% 3rd OM, 20% RCA disease and normal LV function. He then fell off follow up until 2016 at which time he was seen for calcifications found incidentally on CT. He underwent an echocardiogram 04/03/2015 which showed stable LV function at 60-65% with normal wall motion, G1DD, and mild AR. Stress test from 04/15/2015 with inadequate HR response which was  limited by arthritic leg pain at which time Lexiscan was used. Test showed a small defect of mild severity present in the apical anterior location, defect is non-reversible consistent with soft tissue attenuation, small defect of mild severity present in the basal inferior, mid inferior and mid inferolateral location. The defect is partially reversible. Consistent with variable diaphragmatic soft tissue attenuation, although cannot completely exclude small area of mid inferolateral mild ischemia. He has not been seen since that time.   He denies recurrent chest pain since admission. Denies SOB, LE edema, orthopnea, PND or syncope. He reports strong family hx in his brother and two sisters with CABG and carotid artery disease in his mother. Follows with PCP.   Past Medical History:  Diagnosis Date   Arthritis    Bowel obstruction (HCC)    Cataract    Collagen vascular disease ([place])    Rheumatoid Arthritis   Colon polyps    Diverticulosis    Kidney stones    Rheumatoid arthritis [facility])     Past Surgical History:  Procedure Laterality Date   CATARACT EXTRACTION, BILATERAL     COLONOSCOPY N/A 04/13/2016   Dr. [name]: 14 mm polyp in the ascending colon removed, diverticulosis in the distal descending colon and sigmoid colon, nonbleeding internal hemorrhoids. Pathology-tubular adenoma. Next colonoscopy 5 years.   ELBOW SURGERY     X 3 last year. Staph infection but not MRSA per patient. infectious disease. out of work 6 months.    TONSILLECTOMY  Prior to Admission medications   Medication Sig Start Date End Date Taking? Authorizing Provider  alendronate (FOSAMAX) 70 MG tablet Take 70 mg by mouth once a week.  09/12/19  Yes [provider]  Ascorbic Acid (VITAMIN C) 1000 MG tablet Take 1,000 mg by mouth daily.   Yes [provider]  aspirin 81 MG tablet Take 81 mg by mouth daily.   Yes [provider]  Calcium Carb-Cholecalciferol (CALCIUM-VITAMIN D) 600-400  MG-UNIT TABS Take 1 tablet by mouth daily. 1200mg  daily   Yes [provider]  folic acid (FOLVITE) 1 MG tablet Take 1 mg by mouth daily.   Yes [provider]  Methotrexate Sodium (METHOTREXATE, PF,) 50 MG/2ML injection Inject 12.5 mg into the muscle once a week.  09/12/17  Yes [provider]  predniSONE (DELTASONE) 10 MG tablet Take 10 mg by mouth daily. 04/01/20  Yes [provider]  Tofacitinib Citrate ER (XELJANZ XR) 11 MG TB24 Take 11 mg by mouth daily.   Yes [provider]  traMADol (ULTRAM) 50 MG tablet Take 1 tablet (50 mg total) by mouth every 6 (six) hours as needed. Patient taking differently: Take 50-100 mg by mouth every 6 (six) hours as needed for moderate pain.  08/31/17  Yes Milton Ferguson, MD  traZODone (DESYREL) 50 MG tablet Take 50 mg by mouth at bedtime as needed for sleep.    Yes [provider]  tamsulosin (FLOMAX) 0.4 MG CAPS capsule Take 1 capsule (0.4 mg total) by mouth daily. Patient not taking: Reported on 6/64/4034 7/42/59   Delora Fuel, MD    Inpatient Medications: Scheduled Meds:  aspirin  81 mg Oral Daily   atorvastatin  40 mg Oral Daily   enoxaparin (LOVENOX) injection  40 mg Subcutaneous D63O   folic acid  1 mg Oral Daily   predniSONE  10 mg Oral Daily   sodium chloride flush  3 mL Intravenous Q12H   Tofacitinib Citrate ER  11 mg Oral Daily   Continuous Infusions:  sodium chloride     PRN Meds: sodium chloride, acetaminophen, nitroGLYCERIN, ondansetron (ZOFRAN) IV, sodium chloride flush, traMADol, traZODone  Allergies:   No Known Allergies  Social History:   Social History   Socioeconomic History   Marital status: Married    Spouse name: Not on file   Number of children: 0   Years of education: Not on file   Highest education level: 12th grade  Occupational History   Occupation: Disbility  Tobacco Use   Smoking status: Former Smoker    Packs/day: 1.00    Years: 39.00    Pack years:  39.00    Types: Cigarettes    Start date: 07/11/1975    Quit date: 04/26/2014    Years since quitting: 5.9   Smokeless tobacco: Never Used   Tobacco comment: Quit x 3 years  Vaping Use   Vaping Use: Never used  Substance and Sexual Activity   Alcohol use: No    Alcohol/week: 0.0 standard drinks   Drug use: No   Sexual activity: Not Currently  Other Topics Concern   Not on file  Social History Narrative   Not on file   Social Determinants of Health   Financial Resource Strain:    Difficulty of Paying Living Expenses:   Food Insecurity:    Worried About Estate manager/land agent of Food in the Last Year:    Ran Out of Food in the Last Year:   Transportation Needs:  Lack of Transportation (Medical):    Lack of Transportation (Non-Medical):   Physical Activity:    Days of Exercise per Week:    Minutes of Exercise per Session:   Stress:    Feeling of Stress :   Social Connections:    Frequency of Communication with Friends and Family:    Frequency of Social Gatherings with Friends and Family:    Attends Religious Services:    Active Member of Clubs or Organizations:    Attends Music therapist:    Marital Status:   Intimate Partner Violence:    Fear of Current or Ex-Partner:    Emotionally Abused:    Physically Abused:    Sexually Abused:     Family History:   Family History  Problem Relation Age of Onset   Heart attack Father    Heart disease Father    Diabetes Mother    Congestive Heart Failure Mother    Heart disease Sister    Heart disease Brother    Heart disease Sister    Colon cancer Neg Hx    Liver disease Neg Hx    Family Status:  Family Status  Relation Name Status   Father  Deceased   Mother  Deceased   Sister  Alive   Brother  Alive   Brother  Alive   Brother  Deceased   Sister  Alive   Neg Hx  (Not Specified)    ROS:  Please see the history of present illness.  All other ROS reviewed and negative.     Physical Exam/Data:   Vitals:     04/10/20 0700 04/10/20 0821 04/10/20 0848 04/10/20 0925  BP: 103/65 120/60  (!) 125/112  Pulse: (!) 49 (!) 55  (!) 59  Resp: 14 14  14   Temp:   97.7 F (36.5 C) 98.6 F (37 C)  TempSrc:   Oral Oral  SpO2: 98% 96%  94%  Weight:      Height:       No intake or output data in the 24 hours ending 04/10/20 0951 Filed Weights   04/09/20 1526 04/09/20 2039  Weight: 100.2 kg 100.2 kg   Body mass index is 30.82 kg/m.   General: Well developed, well nourished, NAD Neck: Negative for carotid bruits. No JVD Lungs: Diminished in bilateral lower lobes. Breathing is unlabored. Cardiovascular: RRR with S1 S2. No murmurs Abdomen: Soft, non-tender, non-distended. No obvious abdominal masses. Extremities: No edema. Radial  pulses 2+ bilaterally Neuro: Alert and oriented. No focal deficits. No facial asymmetry. MAE spontaneously. Psych: Responds to questions appropriately with normal affect.    EKG:  The EKG was personally reviewed and demonstrates: 04/10/20 SB HR 53bpm with inferolateral TWI, changed from prior tracing  Telemetry:  Telemetry was personally reviewed and demonstrates: 04/10/20 NSR/SB HRs in the 50-60  Relevant CV Studies:  ECHO: Pending   Echocardiogram 04/03/2015:  Study Conclusions   - Left ventricle: The cavity size was normal. There was mild    concentric hypertrophy. Systolic function was normal. The    estimated ejection fraction was in the range of 60% to 65%. Wall    motion was normal; there were no regional wall motion    abnormalities. Doppler parameters are consistent with abnormal    left ventricular relaxation (grade 1 diastolic dysfunction).  - Aortic valve: Mildly calcified annulus. Trileaflet; mildly    calcified leaflets. There was mild regurgitation.   Study Conclusions   - Left ventricle: The cavity size  was normal. There was mild    concentric hypertrophy. Systolic function was normal. The    estimated ejection fraction was in the range of 60% to  65%. Wall    motion was normal; there were no regional wall motion    abnormalities. Doppler parameters are consistent with abnormal    left ventricular relaxation (grade 1 diastolic dysfunction).  - Aortic valve: Mildly calcified annulus. Trileaflet; mildly    calcified leaflets. There was mild regurgitation.   Stress test 04/15/2015:  There was no significant ST segment deviation noted during stress. Inadeqaute HR response with exercise, limited by arthritic leg pain. Lexiscan employed. There is a small defect of mild severity present in the apical anterior location. The defect is non-reversible. Consistent with soft tissue attenuation. There is a small defect of mild severity present in the basal inferior, mid inferior and mid inferolateral location. The defect is partially reversible. Consistent with variable diaphragmatic soft tissue attenuation, although cannot completely exclude small area of mid inferolateral mild ischemia. This is a low risk study. Nuclear stress EF: 70%.   CATH: 03/2002:  RESULTS: 1. The left main coronary artery is widely patent and bifurcates into a    left anterior descending artery and left circumflex artery. 2. The left anterior descending artery is widely patent throughout its    course except for some luminal irregularities. It gives rise to one    large diagonal branch which is widely patent. 3. Left circumflex is widely patent throughout its course and gives rise to    three obtuse marginal branches. The first obtuse marginal branch has a    proximal 30% narrowing. The second obtuse marginal branch is very small    and the third obtuse marginal branch has a 50-60% ostial narrowing. 4. Right coronary artery is widely patent throughout its course except for    luminal irregularities up to 20%. There is a distal 30% narrowing just    before the bifurcation of the posterior descending artery and    posterolateral artery, both of which are widely patent  except for some    luminal irregularities in the posterolateral artery up to 20%.   LEFT VENTRICULOGRAM: The left ventriculography performed in the 30-degree RAO view using a total of 30 cc of contrast at 13 cc/sec. showed normal LV function. No regional wall motion abnormalities. Aortic pressure 103/62, LV 113/23 mmHg.   ASSESSMENT: 1. Noncardiac chest pain. 2. Nonobstructive coronary disease. 3. Normal left ventricular function.  Laboratory Data:  Chemistry Recent Labs  Lab 04/09/20 1536 04/10/20 0436  NA 138 135  K 3.3* 3.8  CL 107 110  CO2 21* 17*  GLUCOSE 134* 108*  BUN 18 17  CREATININE 1.20 0.99  CALCIUM 8.5* 8.1*  GFRNONAA >60 >60  GFRAA >60 >60  ANIONGAP 10 8    Total Protein  Date Value Ref Range Status  09/03/2017 6.5 6.5 - 8.1 g/dL Final   Albumin  Date Value Ref Range Status  09/03/2017 3.6 3.5 - 5.0 g/dL Final   AST  Date Value Ref Range Status  09/03/2017 24 15 - 41 U/L Final   ALT  Date Value Ref Range Status  09/03/2017 33 17 - 63 U/L Final   Alkaline Phosphatase  Date Value Ref Range Status  09/03/2017 39 38 - 126 U/L Final   Total Bilirubin  Date Value Ref Range Status  09/03/2017 1.0 0.3 - 1.2 mg/dL Final   Hematology Recent Labs  Lab 04/09/20 1536 04/10/20 0436  WBC 8.8 8.2  RBC 4.55 4.33  HGB 14.6 13.9  HCT 44.2 43.3  MCV 97.1 100.0  MCH 32.1 32.1  MCHC 33.0 32.1  RDW 14.5 14.5  PLT 467* 436*   Cardiac EnzymesNo results for input(s): TROPONINI in the last 168 hours. No results for input(s): TROPIPOC in the last 168 hours.  BNPNo results for input(s): BNP, PROBNP in the last 168 hours.  DDimer No results for input(s): DDIMER in the last 168 hours. TSH: No results found for: TSH Lipids: Lab Results  Component Value Date   CHOL 235 (H) 04/10/2020   HDL 32 (L) 04/10/2020   LDLCALC 184 (H) 04/10/2020   TRIG 96 04/10/2020   CHOLHDL 7.3 04/10/2020   HgbA1c: Lab Results  Component Value Date   HGBA1C 5.4 03/27/2015      Radiology/Studies:  DG Chest 2 View  Result Date: 04/09/2020 CLINICAL DATA:  Chest pain and dizziness. EXAM: CHEST - 2 VIEW COMPARISON:  Remote exam 10/18/2003 FINDINGS: Stable upper normal heart size.The cardiomediastinal contours are normal. Mild bronchial thickening. Pulmonary vasculature is normal. No consolidation, pleural effusion, or pneumothorax. No acute osseous abnormalities are seen. IMPRESSION: Mild bronchial thickening. Electronically Signed   By: Keith Rake M.D.   On: 04/09/2020 16:05   CT Head Wo Contrast  Result Date: 04/09/2020 CLINICAL DATA:  Headache and dizziness. EXAM: CT HEAD WITHOUT CONTRAST TECHNIQUE: Contiguous axial images were obtained from the base of the skull through the vertex without intravenous contrast. COMPARISON:  12/04/2016 FINDINGS: Brain: No intracranial hemorrhage, mass effect, or midline shift. No hydrocephalus. Brain volume is normal for age. The basilar cisterns are patent. No evidence of territorial infarct or acute ischemia. No extra-axial or intracranial fluid collection. Vascular: Atherosclerosis of skullbase vasculature without hyperdense vessel or abnormal calcification. Skull: No fracture or focal lesion. Sinuses/Orbits: Paranasal sinuses and mastoid air cells are clear. The visualized orbits are unremarkable. Bilateral lens extraction. Other: None. IMPRESSION: No acute intracranial abnormality. Electronically Signed   By: Keith Rake M.D.   On: 04/09/2020 21:47   CT Angio Neck W and/or Wo Contrast  Result Date: 04/09/2020 CLINICAL DATA:  Initial evaluation for acute headache, dizziness. EXAM: CT ANGIOGRAPHY NECK TECHNIQUE: Multidetector CT imaging of the neck was performed using the standard protocol during bolus administration of intravenous contrast. Multiplanar CT image reconstructions and MIPs were obtained to evaluate the vascular anatomy. Carotid stenosis measurements (when applicable) are obtained utilizing NASCET criteria, using  the distal internal carotid diameter as the denominator. CONTRAST:  79mL OMNIPAQUE IOHEXOL 350 MG/ML SOLN COMPARISON:  Prior head CT from earlier the same day. FINDINGS: Aortic arch: Visualized aortic arch of normal caliber with normal branch pattern. Mild atheromatous change within the arch itself. No high-grade stenosis about the origin of the great vessels. Right carotid system: Mild scattered nonstenotic plaque present within the right common carotid artery without significant stenosis. Bulky mixed plaque about the right bifurcation/proximal right ICA with associated stenosis of up to 35% by NASCET criteria. Right ICA otherwise patent without stenosis, dissection or occlusion. Left carotid system: Eccentric soft plaque seen throughout the left CCA without high-grade stenosis. Extensive predominantly soft plaque about the left bifurcation/proximal left ICA with associated severe near occlusive stenosis (series 7, image 122). A radiographic string sign is present. Area of involvement begins at the left bifurcation and measures approximately 19 mm in length. Distally, left ICA patent without stenosis, dissection, or occlusion. Vertebral arteries: Both vertebral arteries arise from the subclavian arteries. Short-segment stenosis of approximately 65%  noted within the proximal left subclavian artery prior to the takeoff of the left vertebral artery (series 8, image 113). Atheromatous plaque at the origin of the left vertebral artery with estimated 50% stenosis. Mild plaque at the origin of the right vertebral artery without significant stenosis. Right vertebral artery is dominant. Additional scattered calcified plaque within the proximal V1/V2 segments bilaterally without high-grade stenosis. Vertebral arteries otherwise patent without stenosis, dissection, or occlusion. Skeleton: No acute osseous abnormality. No discrete or worrisome osseous lesions. Other neck: No other acute soft tissue abnormality within the neck.  No mass lesion or adenopathy. Few subcentimeter hypodense thyroid nodules measuring up to 4 mm noted, felt to be of doubtful significance given size and patient age. No follow-up imaging recommended regarding these lesions. Upper chest: Chronic fibrotic changes noted within the visualized lungs, better evaluated on concomitant chest CT. IMPRESSION: 1. Negative CTA for acute dissection or other abnormality. 2. 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 Vascular surgery consultation and referral recommended for further evaluation and consideration of possible CEA. 3. Bulky mixed plaque about the right carotid bifurcation/proximal right ICA with associated stenosis of up to 35% by NASCET criteria. 4. 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 5. 50% atheromatous stenosis at the origin of the left vertebral artery. Right vertebral artery is dominant. Electronically Signed   By: Jeannine Boga M.D.   On: 04/09/2020 23:01   CT Angio Chest/Abd/Pel for Dissection W and/or Wo Contrast  Result Date: 04/09/2020 CLINICAL DATA:  64 year old male with chest and back pain. Dizziness. EXAM: CT ANGIOGRAPHY CHEST, ABDOMEN AND PELVIS TECHNIQUE: Multidetector CT imaging through the chest, abdomen and pelvis was performed using the standard protocol during bolus administration of intravenous contrast. Multiplanar reconstructed images and MIPs were obtained and reviewed to evaluate the vascular anatomy. CONTRAST:  31mL OMNIPAQUE IOHEXOL 350 MG/ML SOLN COMPARISON:  Noncontrast CT Abdomen and Pelvis 02/04/2018. CTA abdomen and pelvis 09/02/2017. Thoracic spine CT 10/19/2003 FINDINGS: CTA CHEST FINDINGS Cardiovascular: Calcified coronary artery atherosclerosis. Comparatively mild calcified plaque of the thoracic aorta. Negative for thoracic aortic dissection or aneurysm. Mild central pulmonary artery enlargement. Bilateral central pulmonary  arteries also appear to be patent. Mild cardiomegaly. No pericardial effusion. Mediastinum/Nodes: Negative.  No lymphadenopathy. Lungs/Pleura: Major airways are patent. Larger lung volumes compared to 2018. Scattered mild bilateral subpleural reticular and/or ground-glass opacity which has a chronic postinflammatory appearance. No pleural effusion. There is a stable small right lower lobe lung nodule since 09/02/2017 (benign) on series 5, image 45. Musculoskeletal: Chronic T5 compression fracture demonstrated in 2005. Stable mild T11 endplate irregularity since 2018. No acute osseous abnormality identified. Review of the MIP images confirms the above findings. CTA ABDOMEN AND PELVIS FINDINGS VASCULAR From the renal arteries distally there is extensive Aortoiliac calcified atherosclerosis. But the major arterial structures remain patent in the abdomen and pelvis. Negative for aneurysm or dissection. The celiac origin and SMA origin remain normal. No mesenteric atherosclerosis identified. The IMA is patent. Review of the MIP images confirms the above findings. NON-VASCULAR Hepatobiliary: Negative liver and gallbladder. Pancreas: Negative aside from some fatty pancreatic atrophy. Spleen: Negative. Adrenals/Urinary Tract: Normal adrenal glands. Bilateral renal enhancement is symmetric and normal. No hydronephrosis. Normal ureters to the bladder. Unremarkable urinary bladder. Renal vascular calcification suspected, nephrolithiasis felt less likely. Stomach/Bowel: Diverticulosis of the sigmoid colon with no active inflammation. Occasional descending colon diverticula without inflammation. Normal retrocecal appendix (series 7, image 184). No large bowel inflammation. Negative terminal ileum.  No dilated small bowel. Decompressed stomach. Negative duodenum. No free air, free fluid, or mesenteric stranding. Lymphatic: No lymphadenopathy. Reproductive: Small chronic fat containing left inguinal hernia, otherwise negative.  Other: No pelvic free fluid. Musculoskeletal: Transitional lumbosacral anatomy with sacralized L5 level. No acute osseous abnormality identified. Review of the MIP images confirms the above findings. IMPRESSION: 1. Negative for aortic dissection or aneurysm. Positive for Aortic Atherosclerosis (ICD10-I70.0), from the renal artery level distally. And positive for calcified coronary artery atherosclerosis. Major arterial structures remain patent. 2. No acute or inflammatory process identified in the chest, abdomen, or pelvis. 3. Mild chronic lung disease. Diverticulosis of the sigmoid colon. Questionable punctate nephrolithiasis. Chronic T5 compression fracture. Electronically Signed   By: Genevie Ann M.D.   On: 04/09/2020 22:10   Assessment and Plan:   1. Chest pain with multiple CV risk factors: -Pt presented to [facility] on 04/09/20 with a two week hx of intermittent chest pain>>two weeks ago he was awakened by an acute episode of anterior chest pain with radiation to his left arm and left jaw which resolved after 10 mins. He states that since that time the chest pain has come and gone at undetermined times occurring at least once per day. Along with the above symptoms, he was having dizziness and palpitations prior to ED presentation.  -EKG with NSR and inferolateral TWI and no ST changes.  -CXR with mild bronchial thickening.  -CTA chest with no acute changes.  -Neck CTA with severe near occlusive atheromatous stenosis involving the left ICA.  -hsT, 8>>8 -LDL, 184 -LHC 03/2002 which showed non-obstructive CAD with patent LAD, 30% 1st OM, 50-60% 3rd OM, 20% RCA disease and normal LV function -Echocardiogram 04/03/2015 which showed stable LV function at 60-65% with normal wall motion, G1DD, and mild AR.  -Stress test from 04/15/2015 with inadequate HR response which was limited by arthritic leg pain at which time Lexiscan was used. Test showed a small defect of mild severity present in the apical anterior location,  defect is non-reversible consistent with soft tissue attenuation, small defect of mild severity present in the basal inferior, mid inferior and mid inferolateral location. The defect is partially reversible. Consistent with variable diaphragmatic soft tissue attenuation, although cannot completely exclude small area of mid inferolateral mild ischemia.  -Given strong family hx, multiple risk factors including family hx, new dx HLD, tobacco use and typical anginal symptoms presentation>will proceed with LHC for further CV assessment  -Continue ASA -Start high intensity atorvastatin -No BB in the setting of baseline bradycardia  -Cardiac catheterization was discussed with the patient fully. The patient understands that risks include but are not limited to stroke (1 in 1000), death (1 in 35), kidney failure [usually temporary] (1 in 500), bleeding (1 in 200), allergic reaction [possibly serious] (1 in 200).  The patient understands and is willing to proceed.    2. 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 -VVS consulted per primary team  -Doppler studies ordered -Continue ASA, statin   3. Bradycardia: -Asymptomatic, HR 50's -Not currently on AV blocking agents   4. HLD: -LDL, 184 -Started on atorvastatin 40>>increase to 80 -Needs repeat lipid panel in 6-8 weeks   For questions or updates, please contact [place] Please consult [email or web address] for contact info under Cardiology/STEMI.   Lyndel Safe NP-C [facility] Pager: [phone] 04/10/2020 9:51 AM  [place] examined chart reviewed Discussed care with patient and NP. Exam with overweight white male  left bruit SEM AR not audible Lungs clear abdomen benign palpable pedal pulses and no edema. SSCP / palpitations Telemetry with no arrhythmia ECG NSR with diffuse T wave inversions. Horrendous family history of premature CAD. Troponin negative and  preliminary echo at bedside with normal EF no RWMA mild AR. He appears to have critical LICA stenosis on CTA Image review shows bulky mixed plaque. Given likely need for left CEA, family history , chest pain and abnormal ECG will arrange left heart cath today to clarify CAD. He will need inpatient VVS consult and will order carotid duplex for further physiologic evaluation of ICA. Risks of cath including stroke, bleeding MI and need for emergency surgery discussed He had contrast yesterday with no issues and normal renal function Willing to proceed. Orders written and lab notified He has not eaten this am   Jenkins Rouge MD [facility]

## 2020-04-10 NOTE — Progress Notes (Signed)
Left carotid artery duplex completed. Refer to "CV Proc" under chart review to view preliminary results.  04/10/2020 5:19 PM Kelby Aline., MHA, RVT, RDCS, RDMS

## 2020-04-10 NOTE — Progress Notes (Signed)
Patient declined cath video  

## 2020-04-10 NOTE — Progress Notes (Signed)
I notified Dr. Erling Conte of results of patients cath and Dr. Oneida Alar note of ok to d/c home. I also informed him that patient and wife still expressing concern of left side of face feeling "different, neck and left sided head pain. He said he expressed these concerns to Dr. Oneida Alar and he stated it was not from his carotid stenosis.I informed Dr. Erling Conte and he stated patient is not quite ready for discharge due to having these issues and would monitor overnight. Patient and wife at bedside and felt more comfortable with him staying until there are answers for these issues that brought the patient in to begin with.

## 2020-04-11 ENCOUNTER — Inpatient Hospital Stay (HOSPITAL_COMMUNITY): Payer: PPO

## 2020-04-11 DIAGNOSIS — G453 Amaurosis fugax: Secondary | ICD-10-CM

## 2020-04-11 DIAGNOSIS — G459 Transient cerebral ischemic attack, unspecified: Secondary | ICD-10-CM

## 2020-04-11 LAB — CBC
HCT: 45 % (ref 39.0–52.0)
Hemoglobin: 14.7 g/dL (ref 13.0–17.0)
MCH: 32.2 pg (ref 26.0–34.0)
MCHC: 32.7 g/dL (ref 30.0–36.0)
MCV: 98.7 fL (ref 80.0–100.0)
Platelets: 392 10*3/uL (ref 150–400)
RBC: 4.56 MIL/uL (ref 4.22–5.81)
RDW: 14.7 % (ref 11.5–15.5)
WBC: 7.7 10*3/uL (ref 4.0–10.5)
nRBC: 0 % (ref 0.0–0.2)

## 2020-04-11 LAB — BASIC METABOLIC PANEL
Anion gap: 9 (ref 5–15)
BUN: 17 mg/dL (ref 8–23)
CO2: 19 mmol/L — ABNORMAL LOW (ref 22–32)
Calcium: 8.1 mg/dL — ABNORMAL LOW (ref 8.9–10.3)
Chloride: 110 mmol/L (ref 98–111)
Creatinine, Ser: 1.13 mg/dL (ref 0.61–1.24)
GFR calc Af Amer: >60 mL/min (ref >60)
GFR calc non Af Amer: >60 mL/min (ref >60)
Glucose, Bld: 92 mg/dL (ref 70–99)
Potassium: 4 mmol/L (ref 3.5–5.1)
Sodium: 138 mmol/L (ref 135–145)

## 2020-04-11 LAB — HEMOGLOBIN A1C
Hgb A1c MFr Bld: 5.7 % — ABNORMAL HIGH (ref 4.8–5.6)
Mean Plasma Glucose: 116.89 mg/dL

## 2020-04-11 MED ORDER — ATORVASTATIN CALCIUM 80 MG PO TABS
80.0000 mg | ORAL_TABLET | Freq: Every day | ORAL | Status: DC
  Administered 2020-04-12 – 2020-04-19 (×8): 80 mg via ORAL
  Filled 2020-04-11 (×8): qty 1

## 2020-04-11 MED ORDER — MECLIZINE HCL 25 MG PO TABS
25.0000 mg | ORAL_TABLET | Freq: Three times a day (TID) | ORAL | Status: DC | PRN
  Filled 2020-04-11: qty 1

## 2020-04-11 NOTE — Progress Notes (Signed)
MRI showed two punctate right frontal acute infarcts,an incidental finding which cud have occurred as postcath complication. I have notified neurology.Stroke workup initiated.I update the finding to the wife on phone and explained our management plan.

## 2020-04-11 NOTE — Progress Notes (Signed)
Cath without significant disease, no additional inpatient cardiology recs at this time. We will sign off and arrange outpatient follow up.   Zandra Abts MD

## 2020-04-11 NOTE — Progress Notes (Addendum)
Patient states that he has been having dizziness and neck pain with movement. Night shift RN had reported that patient was dizzy while standing to use the urinal overnight. This RN ambulated patient in the hallway. After ambulating about 100 feet with steady gait, patient began to complain of dizziness and had to sit down. Patient started to also complain of pain in the back of his neck. BP was 129/80. Patient states that this has been happening at home since Tuesday and that the pain sometimes radiates to the left side of his neck and face.   Patient was returned to his room via wheelchair. Dr. Tawanna Solo made aware; order was placed for PT evaluation.

## 2020-04-11 NOTE — Evaluation (Addendum)
Physical Therapy Evaluation Patient Details Name: Ronald Cameron MRN: [id number] DOB: 06-16-1956 Today's Date: 04/11/2020   History of Present Illness  Pt is a 64 y/o male admitted secondary to palpitations, chest discomfort and dizziness. Pt underwent cardiac cath which revealed He has > 80% left ICA stenosis with relatively high lesion extending to C2 level. Left TCAR stent planned for 7/27. PMH including but not limited to RA and diverticulosis.     Clinical Impression  Pt presented supine in bed with HOB elevated, awake and willing to participate in therapy session. Prior to admission, pt reported that he was independent with all functional mobility and ADLs. Pt lives with his wife in a single level home with a few steps to enter. At the time of evaluation, pt very significantly limited in regards to mobility and safety secondary to persistent dizziness. Orthostatic vitals were assessed and were negative (see below for BP measurements). Pt also without any nystagmus or symptoms suggestive of BPPV. Pt's wife reporting that she has to return to work and is very concerned for pt to be at home by himself. Pt presents with persistent dizziness that is exacerbated with any movement, specifically with cervical rotation, as well as left eye blurred vision, headache and neck pain, all of which are persistent and exacerbated with any movement. He is at a very high risk for falls and very concerned for his safety if he is discharged home while these symptoms persist. MD was contacted and made aware of pt's current symptoms and presentation. RN also aware and present at end of session.   Pt would continue to benefit from skilled physical therapy services at this time while admitted and after d/c to address the below listed limitations in order to improve overall safety and independence with functional mobility.     Follow Up Recommendations Outpatient PT;Supervision/Assistance - 24 hour    Equipment  Recommendations  Wheelchair (measurements PT);Wheelchair cushion (measurements PT)    Recommendations for Other Services Other (comment) (Neurology Consult)     Precautions / Restrictions Precautions Precautions: Fall Restrictions Weight Bearing Restrictions: No      Mobility  Bed Mobility Overal bed mobility: Modified Independent                Transfers Overall transfer level: Needs assistance Equipment used: None Transfers: Sit to/from Stand Sit to Stand: Supervision         General transfer comment: supervision for safety secondary to + dizziness  Ambulation/Gait             General Gait Details: deferred as pt had recently ambulated with RN with significant, persistent dizziness requiring a w/c to return to room  Stairs            Wheelchair Mobility    Modified Rankin (Stroke Patients Only)       Balance Overall balance assessment: Needs assistance Sitting-balance support: Feet supported Sitting balance-Leahy Scale: Fair     Standing balance support: During functional activity;No upper extremity supported;Single extremity supported Standing balance-Leahy Scale: Poor Standing balance comment: persistent dizziness                             Pertinent Vitals/Pain Pain Assessment: Faces Faces Pain Scale: Hurts even more Pain Location: neck with cervical ROM Pain Descriptors / Indicators: Grimacing;Guarding Pain Intervention(s): Monitored during session;Repositioned    Home Living Family/patient expects to be discharged to:: Private residence Living Arrangements: Spouse/significant other Available Help  at Discharge: Family;Available PRN/intermittently Type of Home: House Home Access: Stairs to enter Entrance Stairs-Rails: Psychiatric nurse of Steps: 2-3 Home Layout: One level Home Equipment: Walker - 2 wheels;Cane - single point;Crutches      Prior Function Level of Independence: Independent                Hand Dominance        Extremity/Trunk Assessment   Upper Extremity Assessment Upper Extremity Assessment: RUE deficits/detail;LUE deficits/detail RUE Deficits / Details: MMT: 5/5 for shoulder flexion, elbow flexion/extension and grip; pt with good coordination during finger-to-nose test RUE Sensation: WNL RUE Coordination: WNL LUE Deficits / Details: MMT: 5/5 for shoulder flexion, elbow flexion/extension and grip; pt with good coordination during finger-to-nose test LUE Sensation: WNL LUE Coordination: WNL    Lower Extremity Assessment Lower Extremity Assessment: RLE deficits/detail;LLE deficits/detail RLE Deficits / Details: MMT: 5/5 for hip flexion, hip abd/add, knee extension, plantarflexion; 4/5 for knee flexion RLE Sensation: WNL RLE Coordination: WNL LLE Deficits / Details: MMT: 5/5 for hip flexion, hip abd/add, knee extension/flexion, plantarflexion LLE Sensation: WNL LLE Coordination: WNL       Communication   Communication: No difficulties  Cognition Arousal/Alertness: Awake/alert Behavior During Therapy: WFL for tasks assessed/performed Overall Cognitive Status: Within Functional Limits for tasks assessed                                        General Comments      Exercises     Assessment/Plan    PT Assessment Patient needs continued PT services  PT Problem List Decreased activity tolerance;Decreased balance;Decreased mobility;Cardiopulmonary status limiting activity;Pain       PT Treatment Interventions DME instruction;Gait training;Stair training;Functional mobility training;Therapeutic activities;Therapeutic exercise;Balance training;Neuromuscular re-education;Patient/family education    PT Goals (Current goals can be found in the Care Plan section)  Acute Rehab PT Goals Patient Stated Goal: to figure out what is causing all of his symptoms (dizziness, headache, L eye blurry vision, neck pain) PT Goal Formulation:  With patient/family Time For Goal Achievement: 04/25/20 Potential to Achieve Goals: Good    Frequency Min 3X/week   Barriers to discharge        Co-evaluation               AM-PAC PT "6 Clicks" Mobility  Outcome Measure Help needed turning from your back to your side while in a flat bed without using bedrails?: None Help needed moving from lying on your back to sitting on the side of a flat bed without using bedrails?: None Help needed moving to and from a bed to a chair (including a wheelchair)?: None Help needed standing up from a chair using your arms (e.g., wheelchair or bedside chair)?: None Help needed to walk in hospital room?: A Lot Help needed climbing 3-5 steps with a railing? : A Lot 6 Click Score: 20    End of Session   Activity Tolerance: Other (comment) (limited secondary to persistent dizziness) Patient left: in bed;with call bell/phone within reach;with family/visitor present Nurse Communication: Mobility status PT Visit Diagnosis: Other abnormalities of gait and mobility (R26.89);Dizziness and giddiness (R42)    Time: 5462-7035 PT Time Calculation (min) (ACUTE ONLY): 39 min   Charges:   PT Evaluation $PT Eval Moderate Complexity: 1 Mod PT Treatments $Therapeutic Activity: 23-37 mins        Eduard Clos, PT, DPT  Acute Rehabilitation Services Pager  [phone] Office [place] 04/11/2020, 1:43 PM

## 2020-04-11 NOTE — Consult Note (Signed)
Neurology Consultation  Reason for Consult: Dizziness, stroke on MRI Referring Physician: Dr. Shelly Coss - [facility]  CC: Dizziness, headache, abnormal MRI  History is obtained from: Chart review, patient  HPI: Ronald Cameron is a 64 y.o. male past medical history of rheumatoid arthritis, osteoarthritis, presented to the emergency room for evaluation of chest pain and palpitations that have been going on for a week.  He said that he has been feeling unwell for the past week but he gets chest pain and palpitations.  He also has been feeling dizzy-that he described as feeling off balance and lightheaded whenever he bends to pick up anything or changes position too fast. In the ED he was afebrile with stable vitals, EKG showed inferior lateral T wave inversions. He also had a CTA head and neck that showed a near occlusive left internal carotid stenosis. He underwent a cardiac cath-Without significant disease.  Had been on aspirin prior to presentation.  Was started on dual antiplatelets. He is getting ready to be discharged after evaluation with vascular surgery for outpatient left internal carotid stent on 04/21/2020 with Dr. Oneida Alar. Neurology service was consulted because he kept complaining of left-sided headache as well as dizziness, for which an MRI brain was obtained.  This showed two punctate high right frontal areas of acute infarction. Patient denies any weakness but reports that he gets frequent left-sided headaches and has been having that for a week.  He also reports off and on left eye blurred vision.  Also reports the dizziness-more with position change but can happen anytime.  There is also report of right leg weakness and numbness intermittently. Denies having had strokes. Family history is pertinent for coronary artery disease in father, brother, mother and sister. Denies history of smoking alcohol or drug use.   LKW: No clear last known well as he has been having dizziness now  for a week along with episodes of blurred vision also since that time. tpa given?: no, strokes on MRI likely incidental Premorbid modified Rankin scale (mRS):1  ROS: Performed and negative except as noted in HPI.  Past Medical History:  Diagnosis Date  . Arthritis   . Bowel obstruction ([place])   . Cataract   . Collagen vascular disease ([facility])    Rheumatoid Arthritis  . Colon polyps   . Diverticulosis   . Kidney stones   . Rheumatoid arthritis ([place])    Family History  Problem Relation Age of Onset  . Heart attack Father   . Heart disease Father   . Diabetes Mother   . Congestive Heart Failure Mother   . Heart disease Sister   . Heart disease Brother   . Heart disease Sister   . Colon cancer Neg Hx   . Liver disease Neg Hx    Social History:   reports that he quit smoking about 5 years ago. His smoking use included cigarettes. He started smoking about 44 years ago. He has a 39.00 pack-year smoking history. He has never used smokeless tobacco. He reports that he does not drink alcohol and does not use drugs.  Medications  Current Facility-Administered Medications:  .  0.9 %  sodium chloride infusion, 250 mL, Intravenous, PRN, [place], Peter M, MD .  0.9 %  sodium chloride infusion, 250 mL, Intravenous, PRN, [place], Peter M, MD .  acetaminophen (TYLENOL) tablet 650 mg, 650 mg, Oral, Q4H PRN, [place], Peter M, MD .  aspirin chewable tablet 81 mg, 81 mg, Oral, Daily, [place], Peter M, MD,  81 mg at 04/11/20 0827 .  [START ON 04/12/2020] atorvastatin (LIPITOR) tablet 80 mg, 80 mg, Oral, Daily, Adhikari, Amrit, MD .  clopidogrel (PLAVIX) tablet 75 mg, 75 mg, Oral, Daily, Elam Dutch, MD, 75 mg at 04/11/20 0827 .  folic acid (FOLVITE) tablet 1 mg, 1 mg, Oral, Daily, [place], Peter M, MD, 1 mg at 04/11/20 0827 .  meclizine (ANTIVERT) tablet 25 mg, 25 mg, Oral, TID PRN, Tawanna Solo, Amrit, MD .  nitroGLYCERIN (NITROSTAT) SL tablet 0.4 mg, 0.4 mg, Sublingual, Q5 Min x 3 PRN, [place], Peter  M, MD .  ondansetron [facility]) injection 4 mg, 4 mg, Intravenous, Q6H PRN, [place], Peter M, MD .  predniSONE (DELTASONE) tablet 10 mg, 10 mg, Oral, Daily, [place], Peter M, MD, 10 mg at 04/11/20 0827 .  sodium chloride flush (NS) 0.9 % injection 3 mL, 3 mL, Intravenous, Q12H, [place], Peter M, MD, 3 mL at 04/10/20 2213 .  sodium chloride flush (NS) 0.9 % injection 3 mL, 3 mL, Intravenous, PRN, [place], Peter M, MD .  sodium chloride flush (NS) 0.9 % injection 3 mL, 3 mL, Intravenous, Q12H, [place], Peter M, MD, 3 mL at 04/11/20 0854 .  sodium chloride flush (NS) 0.9 % injection 3 mL, 3 mL, Intravenous, Q12H, [place], Peter M, MD, 3 mL at 04/11/20 0854 .  sodium chloride flush (NS) 0.9 % injection 3 mL, 3 mL, Intravenous, PRN, [place], Peter M, MD .  Tofacitinib Citrate ER TB24 11 mg, 11 mg, Oral, Daily, [place], Peter M, MD, 11 mg at 04/11/20 1517 .  traMADol (ULTRAM) tablet 50-100 mg, 50-100 mg, Oral, Q6H PRN, [place], Peter M, MD, 50 mg at 04/10/20 2225 .  traZODone (DESYREL) tablet 50 mg, 50 mg, Oral, QHS PRN, [place], Peter M, MD, 50 mg at 04/10/20 2225   Exam: Current vital signs: BP (!) 114/59 (BP Location: Left Arm)   Pulse 65   Temp 98.4 F (36.9 C) (Oral)   Resp 20   Ht 5\' 11"  (1.803 m)   Wt 100.2 kg   SpO2 96%   BMI 30.82 kg/m  Vital signs in last 24 hours: Temp:  [97.7 F (36.5 C)-98.6 F (37 C)] 98.4 F (36.9 C) (07/17 1300) Pulse Rate:  [65-76] 65 (07/17 1300) Resp:  [16-20] 20 (07/17 1300) BP: (96-114)/(57-65) 114/59 (07/17 1300) SpO2:  [94 %-99 %] 96 % (07/17 1300) GENERAL: Awake, alert in NAD HEENT: - Normocephalic and atraumatic, dry mm, no LN++, no Thyromegally LUNGS - Clear to auscultation bilaterally with no wheezes CV - S1S2 RRR ABDOMEN - Soft, nontender, nondistended with normoactive BS Ext: warm, well perfused, intact peripheral pulses, no edema NEURO:  Mental Status: AA&Ox3  Language: speech is nondysarthric.  Naming, repetition, fluency, and comprehension  intact Cranial Nerves: PERRL. EOMI, visual fields full, no facial asymmetry,facial sensation intact, hearing intact, tongue/uvula/soft palate midline, normal sternocleidomastoid and trapezius muscle strength. No evidence of tongue atrophy or fibrillations Motor: Antigravity 5/5 in all fours Tone: is normal and bulk is normal Sensation- Intact to light touch bilaterally Coordination: FTN intact bilaterally Gait- deferred NIHSS-0  Labs I have reviewed labs in epic and the results pertinent to this consultation are:  CBC    Component Value Date/Time   WBC 7.7 04/11/2020 0716   RBC 4.56 04/11/2020 0716   HGB 14.7 04/11/2020 0716   HCT 45.0 04/11/2020 0716   PLT 392 04/11/2020 0716   MCV 98.7 04/11/2020 0716   MCH 32.2 04/11/2020 0716   MCHC 32.7 04/11/2020 0716  RDW 14.7 04/11/2020 0716   LYMPHSABS 1.1 09/02/2017 0546   MONOABS 1.0 09/02/2017 0546   EOSABS 0.1 09/02/2017 0546   BASOSABS 0.0 09/02/2017 0546   CMP     Component Value Date/Time   NA 138 04/11/2020 0716   K 4.0 04/11/2020 0716   CL 110 04/11/2020 0716   CO2 19 (L) 04/11/2020 0716   GLUCOSE 92 04/11/2020 0716   BUN 17 04/11/2020 0716   CREATININE 1.13 04/11/2020 0716   CALCIUM 8.1 (L) 04/11/2020 0716   PROT 6.5 09/03/2017 0402   ALBUMIN 3.6 09/03/2017 0402   AST 24 09/03/2017 0402   ALT 33 09/03/2017 0402   ALKPHOS 39 09/03/2017 0402   BILITOT 1.0 09/03/2017 0402   GFRNONAA >60 04/11/2020 0716   GFRAA >60 04/11/2020 0716   Lipid Panel     Component Value Date/Time   CHOL 235 (H) 04/10/2020 0436   TRIG 96 04/10/2020 0436   HDL 32 (L) 04/10/2020 0436   CHOLHDL 7.3 04/10/2020 0436   VLDL 19 04/10/2020 0436   LDLCALC 184 (H) 04/10/2020 0436  A1c-pending  2D echo IMPRESSION 1. Left ventricular ejection fraction, by estimation, is 55 to 60%. The  left ventricle has normal function. The left ventricle has no regional  wall motion abnormalities. There is mild left ventricular hypertrophy.  Left  ventricular diastolic parameters  are consistent with Grade I diastolic dysfunction (impaired relaxation).  2. Right ventricular systolic function is normal. The right ventricular  size is normal.  3. The mitral valve is normal in structure. No evidence of mitral valve  regurgitation. No evidence of mitral stenosis.  4. The aortic valve is normal in structure. Aortic valve regurgitation is  mild to moderate. Mild aortic valve sclerosis is present, with no evidence  of aortic valve stenosis.  5. The inferior vena cava is normal in size with greater than 50%  respiratory variability, suggesting right atrial pressure of 3 mmHg.   Imaging I have reviewed the images obtained: CT-scan of the brain-no acute changes  CT angio head and neck-no dissection, severe near occlusive atheromatous stenosis of the proximal left ICA.  Bulky mixed plaque about the right carotid bifurcation/proximal right ICA with up to 35% stenosis.  Short segment 65% stenosis within the proximal left subclavian.  50% atheromatous stenosis at the origin of the left vert.  Right vert is dominant. MRI examination of the brain-two punctate strokes in the right frontal lobe  Assessment:  64 year old man with no significant coronary artery disease, rheumatoid arthritis, presented for a week worth of dizziness and palpitations along with visual changes in the left eye-that he describes as blurred vision off and on and some right leg weakness and numbness that is also been intermittent. Cardiac work-up has been unremarkable for any acute coronary artery pathology or chronic coronary artery changes. Work-up because of chest pain etc. led to a CTA head and neck which showed a significantly/nearly occlusive stenosis of the left ICA in the neck. He continued to complain of being dizzy for which an MRI was done which showed two punctate areas of restricted diffusion representing acute infarction in the right anterior temporal lobe very  cortically based. I think the strokes on the MRI are merely periprocedural from the cardiac cath. More concerning findings are the left ICA near occlusion and the left eye blurred vision, probably from the ophthalmic involvement as well as right sided weakness and numbness symptoms, which could represent TIAs emanating from this symptomatic left carotid.  Impression: -  Left hemispheric TIA-likely secondary to the symptomatic Near occluded left internal carotid artery -Left eye blurred vision-question amaurosis fugax again secondary to the symptomatic left internal carotid (ophthalmic artery is the first branch from the internal carotid intracranially hence symptoms will be ipsilateral) -Stroke seen on MRI in the right frontal lobe -likely periprocedural and asymptomatic because the involve the right hemisphere and his symptoms are right side of the body with a near occlusive stenosis of the left carotid.  Recommendations: Already started on dual antiplatelets-continue High-dose statin for goal LDL of less than 70 Check A1c Telemetry Frequent neurochecks PT OT speech therapy No need for permissive hypertension-blood pressures on discharge should be 140/90 or below Already plan for left internal carotid vascularization by vascular surgery on April 21, 2020.  I spoke with Dr. Oneida Alar, vascular surgeon, personally and relayed my assessment of this left carotid stenosis being symptomatic and request that he consider early revascularization of the carotid-sooner than the already scheduled outpatient July 27 appointment.  He will look into the schedule and the case and update the primary team. Plan also  discussed with Dr. Tawanna Solo over the phone after discussion with Dr Oneida Alar. Stroke team will continue to follow with you.  -- Amie Portland, MD [facility] Pager: [phone] If 7pm to 7am, please call on call as listed on AMION.

## 2020-04-11 NOTE — Progress Notes (Signed)
PROGRESS NOTE    Ronald Cameron  JQZ:[id number] DOB: April 03, 1956 DOA: 04/09/2020 PCP: Claretta Fraise, MD   Brief Narrative: Patient is a 64 year old male with history of rheumatoid arthritis, chronic pain syndrome, diverticulitis who presents to the emergency department with complaints of palpitations, chest discomfort.  On presentation, EKG showed inferolateral T wave inversions.  CT head was negative.  Acute intracranial abnormalities.  CT chest/abdomen/pelvis was negative for aortic dissection or aneurysm.  CTA neck was concerning of severe near occlusive atheromatous stenosis involving the left ICA.  Cardiology and vascular surgery consulted.  He underwent cardiac catheter with finding of nonobstructive  coronary disease.  Venous duplex of the carotid arteries confirmed 80 to 99% stenosis of the left ICA.  Vascular surgery planning for stent placement as an outpatient.  He was planned for discharge today but he continues to remain dizzy.  Discharge held, MRI ordered for suspicion of stroke.  Assessment & Plan:   Principal Problem:   Chest pain Active Problems:   Rheumatoid arthritis (HCC)   Carotid stenosis   Hypokalemia   Dizziness: Complains of neck pain on the left side and also dizziness while ambulating.  Continue meclizine for symptomatic relief for now.  Will check MRI of the head to rule out stroke.  Severe carotid stenosis: CTA neck and venous duplex of the carotid artery confirmed severe stenosis of the left ICA.  Vascular surgery was consulted.  Continue aspirin and Plavix.  Vascular surgery recommended to follow-up as an outpatient for stenting.  He has an appointment on 04/21/2020.  Chest pain/palpitations: Presented with 1 week history of intermittent palpitations, chest discomfort.  EKG showed T wave inversions.  Troponins were normal.  No acute pathology as per CT chest abdomen/pelvis.  Cardiology was consulted and he underwent cardiac cath which showed proximal RCA  lesion with 60% stenosis but nonobstructive.  Echo showed ejection fraction of 55 to 60%, mild left ventricular hypertrophy, grade 1 diastolic dysfunction, no regional wall motion abnormalities.   Cardiology signed off.  Hypokalemia :supplemented and corrected  Rheumatoid arthritis: Stable.  Follow-up as an outpatient.  Takes Morrie Sheldon, prednisone and methotrexate  Hyperlipidemia: LDL of 184.  Started on Lipitor.         DVT prophylaxis:Lovenox Code Status: Full Family Communication: Wife on  phone on 04/11/2020 Status is: Inpatient  Remains inpatient appropriate because:Unsafe d/c plan   Dispo: The patient is from: Home              Anticipated d/c is to: Home              Anticipated d/c date is: 1 day              Patient currently is not medically stable to d/c.   Consultants: cardiology,vascular surgery  Procedures:Cardiac cath  Antimicrobials:  Anti-infectives (From admission, onward)   None      Subjective: Patient seen and examined at the bedside this morning.  Hemodynamically stable but was complaining of some pain on the left side of the neck and was dizzy on ambulating.  PT evaluated the patient and he was again noted to be dizzy but no problem with gait.  Discharge held.  Waiting for MRI.  Objective: Vitals:   04/10/20 1600 04/10/20 1630 04/10/20 2059 04/11/20 0500  BP: 111/68 110/70 (!) 96/57 102/65  Pulse: 73 65 74 76  Resp:   16 18  Temp:   98.6 F (37 C) 97.7 F (36.5 C)  TempSrc:   Oral Oral  SpO2: 95% 97% 99% 94%  Weight:      Height:        Intake/Output Summary (Last 24 hours) at 04/11/2020 1322 Last data filed at 04/11/2020 0800 Gross per 24 hour  Intake 200 ml  Output 1400 ml  Net -1200 ml   Filed Weights   04/09/20 1526 04/09/20 2039  Weight: 100.2 kg 100.2 kg    Examination:  General exam: Not in distress, obese  HEENT:PERRL,Oral mucosa moist, Ear/Nose normal on gross exam Respiratory system: Bilateral equal air entry, normal  vesicular breath sounds, no wheezes or crackles  Cardiovascular system: S1 & S2 heard, RRR. No JVD, murmurs, rubs, gallops or clicks. No pedal edema. Gastrointestinal system: Abdomen is nondistended, soft and nontender. No organomegaly or masses felt. Normal bowel sounds heard. Central nervous system: Alert and oriented.  Extremities: No edema, no clubbing ,no cyanosis Skin: No rashes, lesions or ulcers,no icterus ,no pallor   Data Reviewed: I have personally reviewed following labs and imaging studies  CBC: Recent Labs  Lab 04/09/20 1536 04/10/20 0436 04/11/20 0716  WBC 8.8 8.2 7.7  HGB 14.6 13.9 14.7  HCT 44.2 43.3 45.0  MCV 97.1 100.0 98.7  PLT 467* 436* 096   Basic Metabolic Panel: Recent Labs  Lab 04/09/20 1536 04/10/20 0436 04/11/20 0716  NA 138 135 138  K 3.3* 3.8 4.0  CL 107 110 110  CO2 21* 17* 19*  GLUCOSE 134* 108* 92  BUN 18 17 17   CREATININE 1.20 0.99 1.13  CALCIUM 8.5* 8.1* 8.1*  MG  --  1.9  --    GFR: Estimated Creatinine Clearance: 82.8 mL/min (by C-G formula based on SCr of 1.13 mg/dL). Liver Function Tests: No results for input(s): AST, ALT, ALKPHOS, BILITOT, PROT, ALBUMIN in the last 168 hours. No results for input(s): LIPASE, AMYLASE in the last 168 hours. No results for input(s): AMMONIA in the last 168 hours. Coagulation Profile: No results for input(s): INR, PROTIME in the last 168 hours. Cardiac Enzymes: Recent Labs  Lab 04/10/20 0436  CKTOTAL 42*   BNP (last 3 results) No results for input(s): PROBNP in the last 8760 hours. HbA1C: No results for input(s): HGBA1C in the last 72 hours. CBG: No results for input(s): GLUCAP in the last 168 hours. Lipid Profile: Recent Labs    04/10/20 0436  CHOL 235*  HDL 32*  LDLCALC 184*  TRIG 96  CHOLHDL 7.3   Thyroid Function Tests: No results for input(s): TSH, T4TOTAL, FREET4, T3FREE, THYROIDAB in the last 72 hours. Anemia Panel: No results for input(s): VITAMINB12, FOLATE, FERRITIN,  TIBC, IRON, RETICCTPCT in the last 72 hours. Sepsis Labs: No results for input(s): PROCALCITON, LATICACIDVEN in the last 168 hours.  Recent Results (from the past 240 hour(s))  SARS Coronavirus 2 by RT PCR (hospital order, performed in [facility] hospital lab) Nasopharyngeal Nasopharyngeal Swab     Status: None   Collection Time: 04/09/20 11:39 PM   Specimen: Nasopharyngeal Swab  Result Value Ref Range Status   SARS Coronavirus 2 NEGATIVE NEGATIVE Final    Comment: (NOTE) SARS-CoV-2 target nucleic acids are NOT DETECTED.  The SARS-CoV-2 RNA is generally detectable in upper and lower respiratory specimens during the acute phase of infection. The lowest concentration of SARS-CoV-2 viral copies this assay can detect is 250 copies / mL. A negative result does not preclude SARS-CoV-2 infection and should not be used as the sole basis for treatment or other patient management decisions.  A negative result may occur  with improper specimen collection / handling, submission of specimen other than nasopharyngeal swab, presence of viral mutation(s) within the areas targeted by this assay, and inadequate number of viral copies (<250 copies / mL). A negative result must be combined with clinical observations, patient history, and epidemiological information.  Fact Sheet for Patients:   StrictlyIdeas.no  Fact Sheet for Healthcare Providers: BankingDealers.co.za  This test is not yet approved or  cleared by the [place] FDA and has been authorized for detection and/or diagnosis of SARS-CoV-2 by FDA under an Emergency Use Authorization (EUA).  This EUA will remain in effect (meaning this test can be used) for the duration of the COVID-19 declaration under Section 564(b)(1) of the Act, 21 U.S.C. section 360bbb-3(b)(1), unless the authorization is terminated or revoked sooner.  Performed at [facility], [place] [address], [place],  [place] [address]          Radiology Studies: DG Chest 2 View  Result Date: 04/09/2020 CLINICAL DATA:  Chest pain and dizziness. EXAM: CHEST - 2 VIEW COMPARISON:  Remote exam 10/18/2003 FINDINGS: Stable upper normal heart size.The cardiomediastinal contours are normal. Mild bronchial thickening. Pulmonary vasculature is normal. No consolidation, pleural effusion, or pneumothorax. No acute osseous abnormalities are seen. IMPRESSION: Mild bronchial thickening. Electronically Signed   By: Keith Rake M.D.   On: 04/09/2020 16:05   CT Head Wo Contrast  Result Date: 04/09/2020 CLINICAL DATA:  Headache and dizziness. EXAM: CT HEAD WITHOUT CONTRAST TECHNIQUE: Contiguous axial images were obtained from the base of the skull through the vertex without intravenous contrast. COMPARISON:  12/04/2016 FINDINGS: Brain: No intracranial hemorrhage, mass effect, or midline shift. No hydrocephalus. Brain volume is normal for age. The basilar cisterns are patent. No evidence of territorial infarct or acute ischemia. No extra-axial or intracranial fluid collection. Vascular: Atherosclerosis of skullbase vasculature without hyperdense vessel or abnormal calcification. Skull: No fracture or focal lesion. Sinuses/Orbits: Paranasal sinuses and mastoid air cells are clear. The visualized orbits are unremarkable. Bilateral lens extraction. Other: None. IMPRESSION: No acute intracranial abnormality. Electronically Signed   By: Keith Rake M.D.   On: 04/09/2020 21:47   CT Angio Neck W and/or Wo Contrast  Result Date: 04/09/2020 CLINICAL DATA:  Initial evaluation for acute headache, dizziness. EXAM: CT ANGIOGRAPHY NECK TECHNIQUE: Multidetector CT imaging of the neck was performed using the standard protocol during bolus administration of intravenous contrast. Multiplanar CT image reconstructions and MIPs were obtained to evaluate the vascular anatomy. Carotid stenosis measurements (when applicable) are obtained utilizing  NASCET criteria, using the distal internal carotid diameter as the denominator. CONTRAST:  60mL OMNIPAQUE IOHEXOL 350 MG/ML SOLN COMPARISON:  Prior head CT from earlier the same day. FINDINGS: Aortic arch: Visualized aortic arch of normal caliber with normal branch pattern. Mild atheromatous change within the arch itself. No high-grade stenosis about the origin of the great vessels. Right carotid system: Mild scattered nonstenotic plaque present within the right common carotid artery without significant stenosis. Bulky mixed plaque about the right bifurcation/proximal right ICA with associated stenosis of up to 35% by NASCET criteria. Right ICA otherwise patent without stenosis, dissection or occlusion. Left carotid system: Eccentric soft plaque seen throughout the left CCA without high-grade stenosis. Extensive predominantly soft plaque about the left bifurcation/proximal left ICA with associated severe near occlusive stenosis (series 7, image 122). A radiographic string sign is present. Area of involvement begins at the left bifurcation and measures approximately 19 mm in length. Distally, left ICA patent without stenosis, dissection, or  occlusion. Vertebral arteries: Both vertebral arteries arise from the subclavian arteries. Short-segment stenosis of approximately 65% noted within the proximal left subclavian artery prior to the takeoff of the left vertebral artery (series 8, image 113). Atheromatous plaque at the origin of the left vertebral artery with estimated 50% stenosis. Mild plaque at the origin of the right vertebral artery without significant stenosis. Right vertebral artery is dominant. Additional scattered calcified plaque within the proximal V1/V2 segments bilaterally without high-grade stenosis. Vertebral arteries otherwise patent without stenosis, dissection, or occlusion. Skeleton: No acute osseous abnormality. No discrete or worrisome osseous lesions. Other neck: No other acute soft tissue  abnormality within the neck. No mass lesion or adenopathy. Few subcentimeter hypodense thyroid nodules measuring up to 4 mm noted, felt to be of doubtful significance given size and patient age. No follow-up imaging recommended regarding these lesions. Upper chest: Chronic fibrotic changes noted within the visualized lungs, better evaluated on concomitant chest CT. IMPRESSION: 1. Negative CTA for acute dissection or other abnormality. 2. 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 Vascular surgery consultation and referral recommended for further evaluation and consideration of possible CEA. 3. Bulky mixed plaque about the right carotid bifurcation/proximal right ICA with associated stenosis of up to 35% by NASCET criteria. 4. 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 5. 50% atheromatous stenosis at the origin of the left vertebral artery. Right vertebral artery is dominant. Electronically Signed   By: Jeannine Boga M.D.   On: 04/09/2020 23:01   CARDIAC CATHETERIZATION  Result Date: 04/10/2020  Prox RCA lesion is 65% stenosed.  LV end diastolic pressure is mildly elevated.  1. Nonobstructive CAD. The mid RCA has an eccentric stenosis. In some view it only appears 40% narrowed but in other views it appears to be 70%. DFR is normal indicating it is not flow limiting. 2. Mildly elevated LVEDP 18 mm Hg Plan: medical management.   ECHOCARDIOGRAM COMPLETE  Result Date: 04/10/2020    ECHOCARDIOGRAM REPORT   Patient Name:   ROCHESTER SERPE Date of Exam: 04/10/2020 Medical Rec #:  [id number]            Height:       71.0 in Accession #:    [id number]           Weight:       221.0 lb Date of Birth:  1955/10/04           BSA:          2.200 m Patient Age:    39 years             BP:           125/112 mmHg Patient Gender: M                    HR:           59 bpm. Exam Location:  Inpatient Procedure: 2D Echo, Cardiac Doppler  and Color Doppler Indications:    CAD Native Vessel                 Chest pain  History:        Patient has prior history of Echocardiogram examinations, most                 recent 04/03/2015. Signs/Symptoms:Chest Pain; Risk Factors:Former                 Smoker. Carotid stenosis.  Sonographer:    Clayton Lefort RDCS (AE) Referring Phys: [id number] [facility]  Sonographer Comments: Suboptimal subcostal window and patient is morbidly obese. Dr. Jenkins Rouge at bedside during echo. IMPRESSIONS  1. Left ventricular ejection fraction, by estimation, is 55 to 60%. The left ventricle has normal function. The left ventricle has no regional wall motion abnormalities. There is mild left ventricular hypertrophy. Left ventricular diastolic parameters are consistent with Grade I diastolic dysfunction (impaired relaxation).  2. Right ventricular systolic function is normal. The right ventricular size is normal.  3. The mitral valve is normal in structure. No evidence of mitral valve regurgitation. No evidence of mitral stenosis.  4. The aortic valve is normal in structure. Aortic valve regurgitation is mild to moderate. Mild aortic valve sclerosis is present, with no evidence of aortic valve stenosis.  5. The inferior vena cava is normal in size with greater than 50% respiratory variability, suggesting right atrial pressure of 3 mmHg. FINDINGS  Left Ventricle: Left ventricular ejection fraction, by estimation, is 55 to 60%. The left ventricle has normal function. The left ventricle has no regional wall motion abnormalities. The left ventricular internal cavity size was normal in size. There is  mild left ventricular hypertrophy. Left ventricular diastolic parameters are consistent with Grade I diastolic dysfunction (impaired relaxation). Right Ventricle: The right ventricular size is normal. No increase in right ventricular wall thickness. Right ventricular systolic function is normal. Left Atrium: Left atrial size was normal in  size. Right Atrium: Right atrial size was normal in size. Pericardium: There is no evidence of pericardial effusion. Mitral Valve: The mitral valve is normal in structure. Normal mobility of the mitral valve leaflets. Mild mitral annular calcification. No evidence of mitral valve regurgitation. No evidence of mitral valve stenosis. Tricuspid Valve: The tricuspid valve is normal in structure. Tricuspid valve regurgitation is not demonstrated. No evidence of tricuspid stenosis. Aortic Valve: The aortic valve is normal in structure. Aortic valve regurgitation is mild to moderate. Aortic regurgitation PHT measures 789 msec. Mild aortic valve sclerosis is present, with no evidence of aortic valve stenosis. Aortic valve mean gradient measures 5.0 mmHg. Aortic valve peak gradient measures 9.7 mmHg. Aortic valve area, by VTI measures 2.04 cm. Pulmonic Valve: The pulmonic valve was normal in structure. Pulmonic valve regurgitation is not visualized. No evidence of pulmonic stenosis. Aorta: The aortic root is normal in size and structure. Venous: The inferior vena cava is normal in size with greater than 50% respiratory variability, suggesting right atrial pressure of 3 mmHg. IAS/Shunts: No atrial level shunt detected by color flow Doppler.  LEFT VENTRICLE PLAX 2D LVIDd:         4.90 cm  Diastology LVIDs:         3.40 cm  LV e' lateral:   8.49 cm/s LV PW:         1.40 cm  LV E/e' lateral: 7.6 LV IVS:        1.40 cm  LV e' medial:    6.64 cm/s LVOT diam:     2.10 cm  LV E/e' medial:  9.7 LV SV:         74 LV SV Index:   34 LVOT Area:     3.46 cm  RIGHT VENTRICLE RV Basal diam:  3.10 cm TAPSE (M-mode): 1.3 cm LEFT ATRIUM           Index       RIGHT ATRIUM  Index LA diam:      3.10 cm 1.41 cm/m  RA Area:     12.20 cm LA Vol (A2C): 44.0 ml 20.00 ml/m RA Volume:   26.80 ml  12.18 ml/m LA Vol (A4C): 58.4 ml 26.55 ml/m  AORTIC VALVE AV Area (Vmax):    2.09 cm AV Area (Vmean):   1.95 cm AV Area (VTI):     2.04 cm  AV Vmax:           156.00 cm/s AV Vmean:          103.000 cm/s AV VTI:            0.363 m AV Peak Grad:      9.7 mmHg AV Mean Grad:      5.0 mmHg LVOT Vmax:         94.00 cm/s LVOT Vmean:        58.100 cm/s LVOT VTI:          0.214 m LVOT/AV VTI ratio: 0.59 AI PHT:            789 msec  AORTA Ao Root diam: 3.50 cm Ao Asc diam:  3.40 cm MITRAL VALVE MV Area (PHT): 2.24 cm    SHUNTS MV Decel Time: 338 msec    Systemic VTI:  0.21 m MV E velocity: 64.70 cm/s  Systemic Diam: 2.10 cm MV A velocity: 85.70 cm/s MV E/A ratio:  0.75 Candee Furbish MD Electronically signed by Candee Furbish MD Signature Date/Time: 04/10/2020/11:43:36 AM    Final    CT Angio Chest/Abd/Pel for Dissection W and/or Wo Contrast  Result Date: 04/09/2020 CLINICAL DATA:  64 year old male with chest and back pain. Dizziness. EXAM: CT ANGIOGRAPHY CHEST, ABDOMEN AND PELVIS TECHNIQUE: Multidetector CT imaging through the chest, abdomen and pelvis was performed using the standard protocol during bolus administration of intravenous contrast. Multiplanar reconstructed images and MIPs were obtained and reviewed to evaluate the vascular anatomy. CONTRAST:  87mL OMNIPAQUE IOHEXOL 350 MG/ML SOLN COMPARISON:  Noncontrast CT Abdomen and Pelvis 02/04/2018. CTA abdomen and pelvis 09/02/2017. Thoracic spine CT 10/19/2003 FINDINGS: CTA CHEST FINDINGS Cardiovascular: Calcified coronary artery atherosclerosis. Comparatively mild calcified plaque of the thoracic aorta. Negative for thoracic aortic dissection or aneurysm. Mild central pulmonary artery enlargement. Bilateral central pulmonary arteries also appear to be patent. Mild cardiomegaly. No pericardial effusion. Mediastinum/Nodes: Negative.  No lymphadenopathy. Lungs/Pleura: Major airways are patent. Larger lung volumes compared to 2018. Scattered mild bilateral subpleural reticular and/or ground-glass opacity which has a chronic postinflammatory appearance. No pleural effusion. There is a stable small right lower  lobe lung nodule since 09/02/2017 (benign) on series 5, image 45. Musculoskeletal: Chronic T5 compression fracture demonstrated in 2005. Stable mild T11 endplate irregularity since 2018. No acute osseous abnormality identified. Review of the MIP images confirms the above findings. CTA ABDOMEN AND PELVIS FINDINGS VASCULAR From the renal arteries distally there is extensive Aortoiliac calcified atherosclerosis. But the major arterial structures remain patent in the abdomen and pelvis. Negative for aneurysm or dissection. The celiac origin and SMA origin remain normal. No mesenteric atherosclerosis identified. The IMA is patent. Review of the MIP images confirms the above findings. NON-VASCULAR Hepatobiliary: Negative liver and gallbladder. Pancreas: Negative aside from some fatty pancreatic atrophy. Spleen: Negative. Adrenals/Urinary Tract: Normal adrenal glands. Bilateral renal enhancement is symmetric and normal. No hydronephrosis. Normal ureters to the bladder. Unremarkable urinary bladder. Renal vascular calcification suspected, nephrolithiasis felt less likely. Stomach/Bowel: Diverticulosis of the sigmoid colon with no active inflammation. Occasional descending colon  diverticula without inflammation. Normal retrocecal appendix (series 7, image 184). No large bowel inflammation. Negative terminal ileum. No dilated small bowel. Decompressed stomach. Negative duodenum. No free air, free fluid, or mesenteric stranding. Lymphatic: No lymphadenopathy. Reproductive: Small chronic fat containing left inguinal hernia, otherwise negative. Other: No pelvic free fluid. Musculoskeletal: Transitional lumbosacral anatomy with sacralized L5 level. No acute osseous abnormality identified. Review of the MIP images confirms the above findings. IMPRESSION: 1. Negative for aortic dissection or aneurysm. Positive for Aortic Atherosclerosis (ICD10-I70.0), from the renal artery level distally. And positive for calcified coronary artery  atherosclerosis. Major arterial structures remain patent. 2. No acute or inflammatory process identified in the chest, abdomen, or pelvis. 3. Mild chronic lung disease. Diverticulosis of the sigmoid colon. Questionable punctate nephrolithiasis. Chronic T5 compression fracture. Electronically Signed   By: Genevie Ann M.D.   On: 04/09/2020 22:10   VAS US CAROTID  Result Date: 04/11/2020 Carotid Arterial Duplex Study Indications:                           Near-occlusive left ICA stenosis seen on                                        CTA. Comparison Study:                      04/03/2015- carotid artery duplex within                                        normal limits Pre-Surgical Evaluation & Surgical     Stenosis at left ICA only. ICA is normal Correlation                            past the stenosis. Anatomy on the left                                        is within normal limits.Left bifurcation                                        is located near the [place]. Performing Technologist: Maudry Mayhew MHA, RDMS, RVT, RDCS  Examination Guidelines: A complete evaluation includes B-mode imaging, spectral Doppler, color Doppler, and power Doppler as needed of all accessible portions of each vessel. Bilateral testing is considered an integral part of a complete examination. Limited examinations for reoccurring indications may be performed as noted.  Left Carotid Findings: +----------+-------+-------+--------+---------------------------------+--------+             PSV     EDV     Stenosis Plaque Description                Comments              cm/s    cm/s                                                         +----------+-------+-------+--------+---------------------------------+--------+  CCA Prox   99      6                                                            +----------+-------+-------+--------+---------------------------------+--------+  CCA Distal 41      4                                                             +----------+-------+-------+--------+---------------------------------+--------+  ICA Prox   456     125     80-99%   irregular, heterogenous and                                                      calcific                                    +----------+-------+-------+--------+---------------------------------+--------+  ICA Distal 35      11                                                           +----------+-------+-------+--------+---------------------------------+--------+  ECA        386     31                                                           +----------+-------+-------+--------+---------------------------------+--------+ +---------+--------+--+--------+--+--------+  Vertebral PSV cm/s 43 EDV cm/s 14 Atypical  +---------+--------+--+--------+--+--------+   Summary:  Left Carotid: 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               The ECA appears >50% stenosed. Vertebrals: Left vertebral artery demonstrates antegrade flow. *See table(s) above for measurements and observations.  Electronically signed by Antony Contras MD on 04/11/2020 at 12:07:40 PM.   Final         Scheduled Meds:  aspirin  81 mg Oral Daily   atorvastatin  40 mg Oral Daily   clopidogrel  75 mg Oral Daily   enoxaparin (LOVENOX) injection  40 mg Subcutaneous Y18H   folic acid  1 mg Oral Daily   predniSONE  10 mg Oral Daily   sodium chloride flush  3 mL Intravenous Q12H   sodium chloride flush  3 mL Intravenous Q12H   sodium chloride flush  3 mL Intravenous Q12H   Tofacitinib Citrate ER  11 mg Oral Daily   Continuous Infusions:  sodium chloride     sodium chloride       LOS: 1 day    Time spent:  More than 50% of that time was spent in counseling and/or coordination of care.      Shelly Coss, MD [facility] P7/17/2021, 1:22 PM

## 2020-04-12 NOTE — Progress Notes (Signed)
SLP Cancellation Note  Patient Details Name: Ronald Cameron MRN: [id number] DOB: 1956/05/25   Cancelled treatment:       Reason Eval/Treat Not Completed: SLP screened, no needs identified, will sign off. Pt passed yale, Per neurologist no acute SLP needs. Will defer eval and sign off.    Tashauna Caisse, Katherene Ponto 04/12/2020, 9:29 AM

## 2020-04-12 NOTE — Progress Notes (Signed)
Called by neurology service yesterday regarding possible TIA symptoms.  I spoke with the patient this morning.  He specifically denies any symptoms of numbness tingling or weakness in his right leg or right arm.  His wife was present for the conversation and also states that she does not know of any episodes of right arm or leg weakness or numbness.  However, he does complain of intermittent blurriness in his left eye.  He does not really describe classic amaurosis.  His primary complaint is left neck and head pain.  He also has some dizziness when he stands up.  He had to stop his walk yesterday due to dizziness.  He had an MRI of the brain yesterday which showed acute infarct right frontal lobe.  Physical exam:  Vitals:   04/11/20 1913 04/11/20 2113 04/11/20 2335 04/12/20 0446  BP: 132/69 127/75 125/71 109/67  Pulse: 69 61 (!) 56 (!) 55  Resp: 18 17 16 15   Temp: 98.4 F (36.9 C) 97.6 F (36.4 C) 97.6 F (36.4 C) 97.8 F (36.6 C)  TempSrc: Oral Oral Oral Oral  SpO2: 99% 98% 100% 98%  Weight:    101.1 kg  Height:        Extremities: Upper extremity lower extremity motor strength 5/5 symmetric HEENT: Currently no visual problems  Assessment: Patient with new right frontal infarct.  It would be unusual for this to be related to his left carotid stenosis.  He has fairly minimal carotid stenosis on the right side.  Certainly risk factors for stroke on the right side would be his recent cath.  I am not sure that his headaches and neck pain are related to his carotid disease.  These would be unusual symptoms.  Other unusual symptoms would be his dizziness and light of the fact that his right vertebral is dominant and patent as well as minimal stenosis of his right carotid artery.  Plan: In light of neurology's evaluation and concerns that her his left carotid needs to be done sooner rather than later we will move up his left TCAR stent date to April 15, 2020, Wednesday  Continue Plavix aspirin  and statin.  Patient should remain in hospital since he is not currently neurologically stable.  Plan procedure details were discussed with the patient and his wife today.  Ruta Hinds, MD [facility] Office: [phone]

## 2020-04-12 NOTE — Progress Notes (Signed)
STROKE TEAM PROGRESS NOTE   HISTORY OF PRESENT ILLNESS (per record) Ronald Cameron is a 64 y.o. male past medical history of rheumatoid arthritis, osteoarthritis, presented to the emergency room for evaluation of chest pain and palpitations that have been going on for a week.  He said that he has been feeling unwell for the past week but he gets chest pain and palpitations.  He also has been feeling dizzy-that he described as feeling off balance and lightheaded whenever he bends to pick up anything or changes position too fast. In the ED he was afebrile with stable vitals, EKG showed inferior lateral T wave inversions. He also had a CTA head and neck that showed a near occlusive left internal carotid stenosis. He underwent a cardiac cath-Without significant disease.  Had been on aspirin prior to presentation.  Was started on dual antiplatelets. He is getting ready to be discharged after evaluation with vascular surgery for outpatient left internal carotid stent on 04/21/2020 with Dr. Oneida Alar. Neurology service was consulted because he kept complaining of left-sided headache as well as dizziness, for which an MRI brain was obtained.  This showed two punctate high right frontal areas of acute infarction. Patient denies any weakness but reports that he gets frequent left-sided headaches and has been having that for a week.  He also reports off and on left eye blurred vision.  Also reports the dizziness-more with position change but can happen anytime.  There is also report of right leg weakness and numbness intermittently. Denies having had strokes. Family history is pertinent for coronary artery disease in father, brother, mother and sister. Denies history of smoking alcohol or drug use. LKW: No clear last known well as he has been having dizziness now for a week along with episodes of blurred vision also since that time. tpa given?: no, strokes on MRI likely incidental Premorbid modified Rankin  scale (mRS):1   INTERVAL HISTORY I have personally reviewed history of presenting illness with the patient, electronic medical records and imaging films in PACS.  He states he has been having recurrent transient episodes of dizziness feeling off balance as well as blurred vision in the outer aspect of the left eye when he stands up or bends down.  CT angiogram and carotid ultrasound both confirmed critical proximal left ICA stenosis.  He is scheduled for elective left carotid angioplasty stenting tomorrow by vascular surgery.  MRI scan of the brain shows a tiny right frontal punctate infarct which is likely because of a recent cardiac cath and is clinically silent.  LDL cholesterol is significantly elevated at 184 mg percent and hemoglobin A1c is 5.7.  2D echo shows normal ejection fraction of 55 to 60% with mild left ventricular hypertrophy.   OBJECTIVE Vitals:   04/11/20 1913 04/11/20 2113 04/11/20 2335 04/12/20 0446  BP: 132/69 127/75 125/71 109/67  Pulse: 69 61 (!) 56 (!) 55  Resp: 18 17 16 15   Temp: 98.4 F (36.9 C) 97.6 F (36.4 C) 97.6 F (36.4 C) 97.8 F (36.6 C)  TempSrc: Oral Oral Oral Oral  SpO2: 99% 98% 100% 98%  Weight:    101.1 kg  Height:        CBC:  Recent Labs  Lab 04/10/20 0436 04/11/20 0716  WBC 8.2 7.7  HGB 13.9 14.7  HCT 43.3 45.0  MCV 100.0 98.7  PLT 436* 381    Basic Metabolic Panel:  Recent Labs  Lab 04/10/20 0436 04/11/20 0716  NA 135 138  K 3.8  4.0  CL 110 110  CO2 17* 19*  GLUCOSE 108* 92  BUN 17 17  CREATININE 0.99 1.13  CALCIUM 8.1* 8.1*  MG 1.9  --     Lipid Panel:     Component Value Date/Time   CHOL 235 (H) 04/10/2020 0436   TRIG 96 04/10/2020 0436   HDL 32 (L) 04/10/2020 0436   CHOLHDL 7.3 04/10/2020 0436   VLDL 19 04/10/2020 0436   LDLCALC 184 (H) 04/10/2020 0436   HgbA1c:  Lab Results  Component Value Date   HGBA1C 5.7 (H) 04/11/2020   Urine Drug Screen: No results found for: LABOPIA, COCAINSCRNUR, LABBENZ,  AMPHETMU, THCU, LABBARB  Alcohol Level No results found for: [place]  MR BRAIN WO CONTRAST 04/11/2020 IMPRESSION:  Two punctate right frontal acute infarcts.   CTA Neck 04/09/2020 IMPRESSION: 1. Negative CTA for acute dissection or other abnormality. 2. 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 Vascular surgery consultation and referral recommended for further evaluation and consideration of possible CEA. 3. Bulky mixed plaque about the right carotid bifurcation/proximal right ICA with associated stenosis of up to 35% by NASCET criteria. 4. 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 5. 50% atheromatous stenosis at the origin of the left vertebral artery. Right vertebral artery is dominant.  CARDIAC CATHETERIZATION 04/10/2020 Prox RCA lesion is 65% stenosed.  LV end diastolic pressure is mildly elevated.   1. Nonobstructive CAD. The mid RCA has an eccentric stenosis. In some view it only appears 40% narrowed but in other views it appears to be 70%. DFR is normal indicating it is not flow limiting.  2. Mildly elevated LVEDP 18 mm Hg Plan: medical management.   ECHOCARDIOGRAM COMPLETE 04/10/2020 IMPRESSIONS   1. Left ventricular ejection fraction, by estimation, is 55 to 60%. The left ventricle has normal function. The left ventricle has no regional wall motion abnormalities. There is mild left ventricular hypertrophy. Left ventricular diastolic parameters are consistent with Grade I diastolic dysfunction (impaired relaxation).   2. Right ventricular systolic function is normal. The right ventricular size is normal.   3. The mitral valve is normal in structure. No evidence of mitral valve regurgitation. No evidence of mitral stenosis.   4. The aortic valve is normal in structure. Aortic valve regurgitation is mild to moderate. Mild aortic valve sclerosis is present, with no evidence of aortic  valve stenosis.   5. The inferior vena cava is normal in size with greater than 50% respiratory variability, suggesting right atrial pressure of 3 mmHg.   VAS US CAROTID 04/11/2020 Summary:   Left Carotid: 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 The ECA appears >50% stenosed.  Vertebrals: Left vertebral artery demonstrates antegrade flow.  ECG - SR rate 66 BPM. (See cardiology reading for complete details)  PHYSICAL EXAM Blood pressure 109/67, pulse (!) 55, temperature 97.8 F (36.6 C), temperature source Oral, resp. rate 15, height 5\' 11"  (1.803 m), weight 101.1 kg, SpO2 98 %. Pleasant middle-age mildly obese Caucasian male not in distress. . Afebrile. Head is nontraumatic. Neck is supple without bruit.    Cardiac exam no murmur or gallop. Lungs are clear to auscultation. Distal pulses are well felt. Neurological Exam ;  Awake  Alert oriented x 3. Normal speech and language.eye movements full without nystagmus.fundi were not visualized. Vision acuity and fields appear normal. Hearing is normal. Palatal movements are normal. Face symmetric. Tongue midline. Normal strength, tone, reflexes and  coordination. Normal sensation. Gait deferred.      ASSESSMENT/PLAN [name] is a 64 y.o. male with history of rheumatoid arthritis and osteoarthritis who presented to ED on 04/09/20 for evaluation of chest pain, palpitations and positional dizziness -> cardiac cath 7/16 ->  nonobstructive CAD -> CTA head and neck that showed a near occlusive left internal carotid stenosis -> scheduled for outpatient left internal carotid stent on 04/21/2020 with Dr. Oneida Alar. MRI obtained for ongoing dizziness and intermittently blurred vision -> two punctate high right frontal areas of acute infarction. He did not receive IV t-PA as it was felt that strokes on MRI were likely incidental.  Stroke: Two punctate right frontal acute infarcts - likely embolic - source  unknown.  Resultant no deficits code Stroke CT Head - not ordered   CT head - not ordered  MRI head - Two punctate right frontal acute infarcts  MRA head - not ordered  CTA Neck - Severe near occlusive atheromatous stenosis involving the proximal left ICA.  Short-segment 65% atheromatous stenosis within the proximal left  subclavian artery.  CT Perfusion - not ordered  Carotid Doppler - Left Carotid: 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 The ECA appears >50% stenosed.   2D Echo - EF 55 to 60%. No cardiac source of emboli identified.   Sars Corona Virus 2 - negative  LDL - 184  HgbA1c - 5.7  UDS - not ordered  VTE prophylaxis - none Diet  Diet Order            Diet Heart Room service appropriate? Yes; Fluid consistency: Thin  Diet effective now                 aspirin 81 mg daily prior to admission, now on aspirin 81 mg daily and clopidogrel 75 mg daily  Patient counseled to be compliant with his antithrombotic medications  Ongoing aggressive stroke risk factor management  Therapy recommendations:  Outpt PT recommended  Disposition:  Pending  Hypertension  Home BP meds: none   Current BP meds: none   Mildly low at times - need to avoid hypotension with known Lt carotid disease. Marland Kitchen Permissive hypertension (OK if < 220/120) but gradually normalize in 5-7 days. . Long-term BP goal normotensive  Hyperlipidemia  Home Lipid lowering medication: none  LDL 184, goal < 70  Current lipid lowering medication: Lipitor 80 mg daily   Continue statin at discharge  Other Stroke Risk Factors  Advanced age  Former cigarette smoker - quit  Obesity, Body mass index is 31.09 kg/m., recommend weight loss, diet and exercise as appropriate   Nonobstructive CAD  Other Active Problems  Code status - Full code  RA - prednisone ; methotrexate ; tramadol ; Xeljanz  Bradycardia  Hospital day # 2 He presented with recurrent episodes  of dizziness and left eye partial blurred vision while standing likely hemodynamic TIAs in the setting of near occlusive left carotid stenosis.  Agree with emergent left carotid revascularization with angioplasty stenting.  Continue aspirin and Plavix and aggressive risk factor modification.  Statin for elevated lipids.  Long discussion with patient and Dr. Tamsen Meek and answered questions.  Greater than 50% time during the 35-minute visit was spent in counseling and coordination of care about his hemodynamic TIAs and critical carotid stenosis and answering questions. Antony Contras, MD Medical Director [facility] Pager: [phone] 04/12/2020 11:15 AM To contact Stroke Continuity provider, please refer to [email or web address] After  hours, contact General Neurology

## 2020-04-12 NOTE — Progress Notes (Signed)
PROGRESS NOTE    Ronald Cameron  XIP:[id number] DOB: 1955/11/17 DOA: 04/09/2020 PCP: Claretta Fraise, MD   Brief Narrative: Patient is a 64 year old male with history of rheumatoid arthritis, chronic pain syndrome, diverticulitis who presents to the emergency department with complaints of palpitations, chest discomfort.  On presentation, EKG showed inferolateral T wave inversions.  CT head was negative.  Acute intracranial abnormalities.  CT chest/abdomen/pelvis was negative for aortic dissection or aneurysm.  CTA neck was concerning of severe near occlusive atheromatous stenosis involving the left ICA.  Cardiology and vascular surgery consulted.  He underwent cardiac catheter with finding of nonobstructive  coronary disease.  Venous duplex of the carotid arteries confirmed 80 to 99% stenosis of the left ICA.  Vascular surgery initially  planning for stent placement as an outpatient.  He was planned for discharge on 04/11/20  but he continue to remain dizzy.  MRI of the brain showed 2 punctate right frontal acute infarcts.  Neurology consulted.  Due to suspicion of ongoing TIA, vascular surgery again notified.Plan for carotoid stenting on Wednesday.  Assessment & Plan:   Principal Problem:   Chest pain Active Problems:   Rheumatoid arthritis (HCC)   Carotid stenosis   Hypokalemia   Dizziness: Complained of  intermittent dizziness with left blurry vision while ambulating. Strong suspicion for TIA .Continue meclizine for symptomatic relief for now.    Acute nonhemorrhagic stroke: MRI of the brain showed 2 punctuate right frontal acute infarcts.  This is most likely associated with post cath complication and unlikely the cause of dizziness.  Initially stroke protocol.  Neurology following.  PT/OT/speech evaluation done.  Hemoglobin A1c of 5.7.  LDL of 184.  Continue Lipitor, aspirin, Plavix.  Severe carotid stenosis: CTA neck and venous duplex of the carotid artery confirmed severe stenosis  of the left ICA.  Vascular surgery was consulted.  Continue aspirin and Plavix.  Vascular surgery recommended to follow-up as an outpatient for stenting  but due to ongoing neurological symptom, we have reached out to vascular surgery again and  the procedure will be done  during this hospitalization on Wednesday  Chest pain/palpitations: Presented with 1 week history of intermittent palpitations, chest discomfort.  EKG showed T wave inversions.  Troponins were normal.  No acute pathology as per CT chest abdomen/pelvis.  Cardiology was consulted and he underwent cardiac cath which showed proximal RCA lesion with 60% stenosis but nonobstructive.  Echo showed ejection fraction of 55 to 60%, mild left ventricular hypertrophy, grade 1 diastolic dysfunction, no regional wall motion abnormalities.  Cardiology signed off.  Hypokalemia :supplemented and corrected  Rheumatoid arthritis: Stable.  Follow-up as an outpatient.  Takes Morrie Sheldon, prednisone and methotrexate  Hyperlipidemia: LDL of 184.  Started on Lipitor.         DVT prophylaxis:Lovenox Code Status: Full Family Communication: Wife on  phone on 04/11/2020 Status is: Inpatient  Remains inpatient appropriate because:Unsafe d/c plan   Dispo: The patient is from: Home              Anticipated d/c is to: Home              Anticipated d/c date is:3-4 days              Patient currently is not medically stable to d/c.  Waiting for vascular surgery intervention  Consultants: cardiology,vascular surgery  Procedures:Cardiac cath  Antimicrobials:  Anti-infectives (From admission, onward)   None      Subjective: Patient seen and examined at the bedside  this morning.  Hemodynamically stable but was complaining of some pain on the left side of the neck and was dizzy on ambulating.  PT evaluated the patient and he was again noted to be dizzy but no problem with gait.  Discharge held.  Waiting for MRI.  Objective: Vitals:   04/11/20 1913  04/11/20 2113 04/11/20 2335 04/12/20 0446  BP: 132/69 127/75 125/71 109/67  Pulse: 69 61 (!) 56 (!) 55  Resp: 18 17 16 15   Temp: 98.4 F (36.9 C) 97.6 F (36.4 C) 97.6 F (36.4 C) 97.8 F (36.6 C)  TempSrc: Oral Oral Oral Oral  SpO2: 99% 98% 100% 98%  Weight:    101.1 kg  Height:        Intake/Output Summary (Last 24 hours) at 04/12/2020 0852 Last data filed at 04/11/2020 2138 Gross per 24 hour  Intake 3 ml  Output 350 ml  Net -347 ml   Filed Weights   04/09/20 1526 04/09/20 2039 04/12/20 0446  Weight: 100.2 kg 100.2 kg 101.1 kg    Examination:  General exam: Appears calm and comfortable ,Not in distress,obese HEENT:PERRL,Oral mucosa moist, Ear/Nose normal on gross exam Respiratory system: Bilateral equal air entry, normal vesicular breath sounds, no wheezes or crackles  Cardiovascular system: S1 & S2 heard, RRR. No JVD, murmurs, rubs, gallops or clicks. Gastrointestinal system: Abdomen is nondistended, soft and nontender. No organomegaly or masses felt. Normal bowel sounds heard. Central nervous system: Alert and oriented Extremities: No edema, no clubbing ,no cyanosis Skin: No rashes, lesions or ulcers,no icterus ,no pallor   Data Reviewed: I have personally reviewed following labs and imaging studies  CBC: Recent Labs  Lab 04/09/20 1536 04/10/20 0436 04/11/20 0716  WBC 8.8 8.2 7.7  HGB 14.6 13.9 14.7  HCT 44.2 43.3 45.0  MCV 97.1 100.0 98.7  PLT 467* 436* 147   Basic Metabolic Panel: Recent Labs  Lab 04/09/20 1536 04/10/20 0436 04/11/20 0716  NA 138 135 138  K 3.3* 3.8 4.0  CL 107 110 110  CO2 21* 17* 19*  GLUCOSE 134* 108* 92  BUN 18 17 17   CREATININE 1.20 0.99 1.13  CALCIUM 8.5* 8.1* 8.1*  MG  --  1.9  --    GFR: Estimated Creatinine Clearance: 83.1 mL/min (by C-G formula based on SCr of 1.13 mg/dL). Liver Function Tests: No results for input(s): AST, ALT, ALKPHOS, BILITOT, PROT, ALBUMIN in the last 168 hours. No results for input(s):  LIPASE, AMYLASE in the last 168 hours. No results for input(s): AMMONIA in the last 168 hours. Coagulation Profile: No results for input(s): INR, PROTIME in the last 168 hours. Cardiac Enzymes: Recent Labs  Lab 04/10/20 0436  CKTOTAL 42*   BNP (last 3 results) No results for input(s): PROBNP in the last 8760 hours. HbA1C: Recent Labs    04/11/20 0716  HGBA1C 5.7*   CBG: No results for input(s): GLUCAP in the last 168 hours. Lipid Profile: Recent Labs    04/10/20 0436  CHOL 235*  HDL 32*  LDLCALC 184*  TRIG 96  CHOLHDL 7.3   Thyroid Function Tests: No results for input(s): TSH, T4TOTAL, FREET4, T3FREE, THYROIDAB in the last 72 hours. Anemia Panel: No results for input(s): VITAMINB12, FOLATE, FERRITIN, TIBC, IRON, RETICCTPCT in the last 72 hours. Sepsis Labs: No results for input(s): PROCALCITON, LATICACIDVEN in the last 168 hours.  Recent Results (from the past 240 hour(s))  SARS Coronavirus 2 by RT PCR (hospital order, performed in [facility] hospital lab) Nasopharyngeal  Nasopharyngeal Swab     Status: None   Collection Time: 04/09/20 11:39 PM   Specimen: Nasopharyngeal Swab  Result Value Ref Range Status   SARS Coronavirus 2 NEGATIVE NEGATIVE Final    Comment: (NOTE) SARS-CoV-2 target nucleic acids are NOT DETECTED.  The SARS-CoV-2 RNA is generally detectable in upper and lower respiratory specimens during the acute phase of infection. The lowest concentration of SARS-CoV-2 viral copies this assay can detect is 250 copies / mL. A negative result does not preclude SARS-CoV-2 infection and should not be used as the sole basis for treatment or other patient management decisions.  A negative result may occur with improper specimen collection / handling, submission of specimen other than nasopharyngeal swab, presence of viral mutation(s) within the areas targeted by this assay, and inadequate number of viral copies (<250 copies / mL). A negative result must be  combined with clinical observations, patient history, and epidemiological information.  Fact Sheet for Patients:   StrictlyIdeas.no  Fact Sheet for Healthcare Providers: BankingDealers.co.za  This test is not yet approved or  cleared by the [place] FDA and has been authorized for detection and/or diagnosis of SARS-CoV-2 by FDA under an Emergency Use Authorization (EUA).  This EUA will remain in effect (meaning this test can be used) for the duration of the COVID-19 declaration under Section 564(b)(1) of the Act, 21 U.S.C. section 360bbb-3(b)(1), unless the authorization is terminated or revoked sooner.  Performed at [facility], [place] [address], [place], [place] [address]          Radiology Studies: MR BRAIN WO CONTRAST  Result Date: 04/11/2020 CLINICAL DATA:  Dizziness EXAM: MRI HEAD WITHOUT CONTRAST TECHNIQUE: Multiplanar, multiecho pulse sequences of the brain and surrounding structures were obtained without intravenous contrast. COMPARISON:  None. FINDINGS: Brain: Two punctate nearby foci of diffusion hyperintensity at the posterior aspect of the right superior frontal gyrus. Small size limits evaluation on ADC. There is no evidence of intracranial hemorrhage. Patchy T2 hyperintensity in the supratentorial white matter is nonspecific but may reflect minor chronic microvascular ischemic changes. Ventricles and sulci are within normal limits in size and configuration. There is no intracranial mass or mass effect. There is no hydrocephalus or extra-axial fluid collection. Vascular: Major vessel flow voids at the skull base are preserved. Skull and upper cervical spine: Normal marrow signal is preserved. Sinuses/Orbits: Trace paranasal sinus mucosal thickening. Bilateral lens replacements. Other: Sella is unremarkable.  Mastoid air cells are clear. IMPRESSION: Two punctate right frontal acute infarcts. Electronically Signed   By:  Macy Mis M.D.   On: 04/11/2020 16:18   CARDIAC CATHETERIZATION  Result Date: 04/10/2020  Prox RCA lesion is 65% stenosed.  LV end diastolic pressure is mildly elevated.  1. Nonobstructive CAD. The mid RCA has an eccentric stenosis. In some view it only appears 40% narrowed but in other views it appears to be 70%. DFR is normal indicating it is not flow limiting. 2. Mildly elevated LVEDP 18 mm Hg Plan: medical management.   ECHOCARDIOGRAM COMPLETE  Result Date: 04/10/2020    ECHOCARDIOGRAM REPORT   Patient Name:   OLSON LUCARELLI Date of Exam: 04/10/2020 Medical Rec #:  [id number]            Height:       71.0 in Accession #:    [id number]           Weight:       221.0 lb Date of Birth:  1955/12/10  BSA:          2.200 m Patient Age:    81 years             BP:           125/112 mmHg Patient Gender: M                    HR:           59 bpm. Exam Location:  Inpatient Procedure: 2D Echo, Cardiac Doppler and Color Doppler Indications:    CAD Native Vessel                 Chest pain  History:        Patient has prior history of Echocardiogram examinations, most                 recent 04/03/2015. Signs/Symptoms:Chest Pain; Risk Factors:Former                 Smoker. Carotid stenosis.  Sonographer:    Clayton Lefort RDCS (AE) Referring Phys: [id number] [facility]  Sonographer Comments: Suboptimal subcostal window and patient is morbidly obese. Dr. Jenkins Rouge at bedside during echo. IMPRESSIONS  1. Left ventricular ejection fraction, by estimation, is 55 to 60%. The left ventricle has normal function. The left ventricle has no regional wall motion abnormalities. There is mild left ventricular hypertrophy. Left ventricular diastolic parameters are consistent with Grade I diastolic dysfunction (impaired relaxation).  2. Right ventricular systolic function is normal. The right ventricular size is normal.  3. The mitral valve is normal in structure. No evidence of mitral valve regurgitation. No  evidence of mitral stenosis.  4. The aortic valve is normal in structure. Aortic valve regurgitation is mild to moderate. Mild aortic valve sclerosis is present, with no evidence of aortic valve stenosis.  5. The inferior vena cava is normal in size with greater than 50% respiratory variability, suggesting right atrial pressure of 3 mmHg. FINDINGS  Left Ventricle: Left ventricular ejection fraction, by estimation, is 55 to 60%. The left ventricle has normal function. The left ventricle has no regional wall motion abnormalities. The left ventricular internal cavity size was normal in size. There is  mild left ventricular hypertrophy. Left ventricular diastolic parameters are consistent with Grade I diastolic dysfunction (impaired relaxation). Right Ventricle: The right ventricular size is normal. No increase in right ventricular wall thickness. Right ventricular systolic function is normal. Left Atrium: Left atrial size was normal in size. Right Atrium: Right atrial size was normal in size. Pericardium: There is no evidence of pericardial effusion. Mitral Valve: The mitral valve is normal in structure. Normal mobility of the mitral valve leaflets. Mild mitral annular calcification. No evidence of mitral valve regurgitation. No evidence of mitral valve stenosis. Tricuspid Valve: The tricuspid valve is normal in structure. Tricuspid valve regurgitation is not demonstrated. No evidence of tricuspid stenosis. Aortic Valve: The aortic valve is normal in structure. Aortic valve regurgitation is mild to moderate. Aortic regurgitation PHT measures 789 msec. Mild aortic valve sclerosis is present, with no evidence of aortic valve stenosis. Aortic valve mean gradient measures 5.0 mmHg. Aortic valve peak gradient measures 9.7 mmHg. Aortic valve area, by VTI measures 2.04 cm. Pulmonic Valve: The pulmonic valve was normal in structure. Pulmonic valve regurgitation is not visualized. No evidence of pulmonic stenosis. Aorta: The  aortic root is normal in size and structure. Venous: The inferior vena cava is normal in size with greater than 50%  respiratory variability, suggesting right atrial pressure of 3 mmHg. IAS/Shunts: No atrial level shunt detected by color flow Doppler.  LEFT VENTRICLE PLAX 2D LVIDd:         4.90 cm  Diastology LVIDs:         3.40 cm  LV e' lateral:   8.49 cm/s LV PW:         1.40 cm  LV E/e' lateral: 7.6 LV IVS:        1.40 cm  LV e' medial:    6.64 cm/s LVOT diam:     2.10 cm  LV E/e' medial:  9.7 LV SV:         74 LV SV Index:   34 LVOT Area:     3.46 cm  RIGHT VENTRICLE RV Basal diam:  3.10 cm TAPSE (M-mode): 1.3 cm LEFT ATRIUM           Index       RIGHT ATRIUM           Index LA diam:      3.10 cm 1.41 cm/m  RA Area:     12.20 cm LA Vol (A2C): 44.0 ml 20.00 ml/m RA Volume:   26.80 ml  12.18 ml/m LA Vol (A4C): 58.4 ml 26.55 ml/m  AORTIC VALVE AV Area (Vmax):    2.09 cm AV Area (Vmean):   1.95 cm AV Area (VTI):     2.04 cm AV Vmax:           156.00 cm/s AV Vmean:          103.000 cm/s AV VTI:            0.363 m AV Peak Grad:      9.7 mmHg AV Mean Grad:      5.0 mmHg LVOT Vmax:         94.00 cm/s LVOT Vmean:        58.100 cm/s LVOT VTI:          0.214 m LVOT/AV VTI ratio: 0.59 AI PHT:            789 msec  AORTA Ao Root diam: 3.50 cm Ao Asc diam:  3.40 cm MITRAL VALVE MV Area (PHT): 2.24 cm    SHUNTS MV Decel Time: 338 msec    Systemic VTI:  0.21 m MV E velocity: 64.70 cm/s  Systemic Diam: 2.10 cm MV A velocity: 85.70 cm/s MV E/A ratio:  0.75 Candee Furbish MD Electronically signed by Candee Furbish MD Signature Date/Time: 04/10/2020/11:43:36 AM    Final    VAS US CAROTID  Result Date: 04/11/2020 Carotid Arterial Duplex Study Indications:                           Near-occlusive left ICA stenosis seen on                                        CTA. Comparison Study:                      04/03/2015- carotid artery duplex within                                        normal limits Pre-Surgical Evaluation & Surgical  Stenosis at left ICA only. ICA is normal Correlation                            past the stenosis. Anatomy on the left                                        is within normal limits.Left bifurcation                                        is located near the Hyoid Notch. Performing Technologist: Maudry Mayhew MHA, RDMS, RVT, RDCS  Examination Guidelines: A complete evaluation includes B-mode imaging, spectral Doppler, color Doppler, and power Doppler as needed of all accessible portions of each vessel. Bilateral testing is considered an integral part of a complete examination. Limited examinations for reoccurring indications may be performed as noted.  Left Carotid Findings: +----------+-------+-------+--------+---------------------------------+--------+           PSV    EDV    StenosisPlaque Description               Comments           cm/s   cm/s                                                     +----------+-------+-------+--------+---------------------------------+--------+ CCA Prox  99     6                                                        +----------+-------+-------+--------+---------------------------------+--------+ CCA Distal41     4                                                        +----------+-------+-------+--------+---------------------------------+--------+ ICA Prox  456    125    80-99%  irregular, heterogenous and                                               calcific                                  +----------+-------+-------+--------+---------------------------------+--------+ ICA Distal35     11                                                       +----------+-------+-------+--------+---------------------------------+--------+ ECA       386    31                                                       +----------+-------+-------+--------+---------------------------------+--------+  +---------+--------+--+--------+--+--------+  VertebralPSV cm/s43EDV cm/s14Atypical +---------+--------+--+--------+--+--------+   Summary:  Left Carotid: 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               The ECA appears >50% stenosed. Vertebrals: Left vertebral artery demonstrates antegrade flow. *See table(s) above for measurements and observations.  Electronically signed by Antony Contras MD on 04/11/2020 at 12:07:40 PM.   Final         Scheduled Meds: . aspirin  81 mg Oral Daily  . atorvastatin  80 mg Oral Daily  . clopidogrel  75 mg Oral Daily  . folic acid  1 mg Oral Daily  . predniSONE  10 mg Oral Daily  . sodium chloride flush  3 mL Intravenous Q12H  . sodium chloride flush  3 mL Intravenous Q12H  . sodium chloride flush  3 mL Intravenous Q12H  . Tofacitinib Citrate ER  11 mg Oral Daily   Continuous Infusions: . sodium chloride    . sodium chloride       LOS: 2 days    Time spent: 25 mins.More than 50% of that time was spent in counseling and/or coordination of care.      Shelly Coss, MD [facility] P7/18/2021, 8:52 AM

## 2020-04-12 NOTE — Progress Notes (Signed)
Physical Therapy Treatment Patient Details Name: Ronald Cameron MRN: [id number] DOB: [birth year] Today's Date: 04/12/2020    History of Present Illness Pt is a 64 y/o male admitted secondary to palpitations, chest discomfort and dizziness. Pt underwent cardiac cath which revealed He has > 80% left ICA stenosis with relatively high lesion extending to C2 level. Left TCAR stent planned for 7/27. 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 PMH including but not limited to RA and diverticulosis.     PT Comments    Pt with improved tolerance for mobility this session although still significant limited compared to baseline due to dizziness. Pt reports increased dizziness with stair negotiation this session, as well as a gradual increase with further activity. Pt does experience 2 minor losses of balance related to dizziness which he is able to correct for with stepping strategy, no UE support needed. Exacerbation of dizziness does not appear to be solely related to head position but more so a gradual increase with continued activity. Pt will benefit from continued acute PT POC to improve activity tolerance and to improve mobility.  Follow Up Recommendations  Outpatient PT;Supervision/Assistance - 24 hour     Equipment Recommendations  None recommended by PT (pt owns RW if needed)    Recommendations for Other Services       Precautions / Restrictions Precautions Precautions: Fall Restrictions Weight Bearing Restrictions: No    Mobility  Bed Mobility Overal bed mobility: Modified Independent             General bed mobility comments: HOB elevated  Transfers Overall transfer level: Needs assistance Equipment used: None Transfers: Sit to/from Stand Sit to Stand: Supervision         General transfer comment: supervision for safety secondary to + dizziness  Ambulation/Gait Ambulation/Gait assistance: Supervision Gait Distance (Feet): 250 Feet Assistive device:  None Gait Pattern/deviations: Step-through pattern;Wide base of support Gait velocity: reduced Gait velocity interpretation: 1.31 - 2.62 ft/sec, indicative of limited community ambulator General Gait Details: pt with slowed step through gait, 2 minor LOB which pt utilizes stepping strategy to correct for   Stairs Stairs: Yes Stairs assistance: Min guard Stair Management: One rail Right Number of Stairs: 3     Wheelchair Mobility    Modified Rankin (Stroke Patients Only) Modified Rankin (Stroke Patients Only) Pre-Morbid Rankin Score: No symptoms Modified Rankin: Moderately severe disability     Balance Overall balance assessment: Needs assistance Sitting-balance support: No upper extremity supported;Feet supported Sitting balance-Leahy Scale: Normal     Standing balance support: No upper extremity supported;During functional activity Standing balance-Leahy Scale: Good Standing balance comment: supervision during session, 2 minor LOB                            Cognition Arousal/Alertness: Awake/alert Behavior During Therapy: WFL for tasks assessed/performed Overall Cognitive Status: Within Functional Limits for tasks assessed                                        Exercises      General Comments General comments (skin integrity, edema, etc.): VSS on RA      Pertinent Vitals/Pain Pain Assessment: Faces Faces Pain Scale: Hurts little more Pain Location: neck with mobility Pain Descriptors / Indicators: Grimacing Pain Intervention(s): Monitored during session    Home Living Family/patient expects to be discharged to::  Private residence Living Arrangements: Spouse/significant other Available Help at Discharge: Family;Available PRN/intermittently Type of Home: House Home Access: Stairs to enter Entrance Stairs-Rails: Right;Left Home Layout: One level Home Equipment: Environmental consultant - 2 wheels;Cane - single point;Crutches      Prior  Function Level of Independence: Independent          PT Goals (current goals can now be found in the care plan section) Acute Rehab PT Goals Patient Stated Goal: to go home when symptoms have resolved. Progress towards PT goals: Progressing toward goals    Frequency    Min 3X/week      PT Plan Current plan remains appropriate    Co-evaluation              AM-PAC PT "6 Clicks" Mobility   Outcome Measure  Help needed turning from your back to your side while in a flat bed without using bedrails?: None Help needed moving from lying on your back to sitting on the side of a flat bed without using bedrails?: None Help needed moving to and from a bed to a chair (including a wheelchair)?: None Help needed standing up from a chair using your arms (e.g., wheelchair or bedside chair)?: None Help needed to walk in hospital room?: None Help needed climbing 3-5 steps with a railing? : A Little 6 Click Score: 23    End of Session   Activity Tolerance: Patient tolerated treatment well Patient left: in bed;with call bell/phone within reach;with family/visitor present Nurse Communication: Mobility status PT Visit Diagnosis: Other abnormalities of gait and mobility (R26.89);Dizziness and giddiness (R42)     Time: 0950-1009 PT Time Calculation (min) (ACUTE ONLY): 19 min  Charges:  $Gait Training: 8-22 mins                     Zenaida Niece, PT, DPT Acute Rehabilitation Pager: [phone]    Zenaida Niece 04/12/2020, 11:11 AM

## 2020-04-12 NOTE — Evaluation (Signed)
Occupational Therapy Evaluation Patient Details Name: Ronald Cameron MRN: [id number] DOB: [birth year] Today's Date: 04/12/2020    History of Present Illness Pt is a 64 y/o male admitted secondary to palpitations, chest discomfort and dizziness. Pt underwent cardiac cath which revealed He has > 80% left ICA stenosis with relatively high lesion extending to C2 level. Left TCAR stent planned for 7/27. 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 PMH including but not limited to RA and diverticulosis.    Clinical Impression   Pt PTA: Pt living with spouse who works constantly; pt reports independence with ADL and mobility. Pt currently performing ADL functional  mobility and ADL tasks with no physical assist at supervisionA to modified independence level.  Pt standing at sink for ADL and simulating walk-in shower transfer in bathroom. Dizziness with transitions only. BP 138/91, 62 BPM with exertion. Pt reports to be feeling better than yesterday with dizziness. Pt does not require continued OT skilled services. OT signing off.  Follow-up with OPPT for dizziness would be helpful.    Follow Up Recommendations  No OT follow up;Supervision - Intermittent    Equipment Recommendations  None recommended by OT    Recommendations for Other Services       Precautions / Restrictions Precautions Precautions: Fall Restrictions Weight Bearing Restrictions: No      Mobility Bed Mobility Overal bed mobility: Modified Independent                Transfers Overall transfer level: Needs assistance Equipment used: None Transfers: Sit to/from Stand Sit to Stand: Supervision         General transfer comment: supervision for safety secondary to + dizziness    Balance Overall balance assessment: Needs assistance Sitting-balance support: Feet supported Sitting balance-Leahy Scale: Fair     Standing balance support: During functional activity;No upper extremity  supported;Single extremity supported Standing balance-Leahy Scale: Poor Standing balance comment: dizziness with transitional movement                           ADL either performed or assessed with clinical judgement   ADL Overall ADL's : Modified independent                                       General ADL Comments: Pt performing ADL functional  mobility and ADL tasks with no physical assist. Dizziness with transitions only. BP 138/91, 62 BPM with exertion. Pt standing at sink for ADL and simulating walk-in shower transfer in bathroom.      Vision Baseline Vision/History: Wears glasses Wears Glasses: Reading only Patient Visual Report: Blurring of vision (L eye only with dizziness, resolves soon after) Vision Assessment?: Yes Eye Alignment: Within Functional Limits Ocular Range of Motion: Within Functional Limits Alignment/Gaze Preference: Within Defined Limits Tracking/Visual Pursuits: Able to track stimulus in all quads without difficulty Saccades: Within functional limits Additional Comments: L eye blurriness reported with dizziness. Eventually resolves.     Perception     Praxis      Pertinent Vitals/Pain Pain Assessment: Faces Faces Pain Scale: Hurts a little bit Pain Location: neck with cervical ROM Pain Descriptors / Indicators: Grimacing;Guarding Pain Intervention(s): Monitored during session     Hand Dominance Right   Extremity/Trunk Assessment Upper Extremity Assessment Upper Extremity Assessment: Overall WFL for tasks assessed RUE Deficits / Details: MMT 5/5  LUE Deficits /  Details: MMT 5/5    Lower Extremity Assessment Lower Extremity Assessment: Defer to PT evaluation;Overall [facility] for tasks assessed   Cervical / Trunk Assessment Cervical / Trunk Assessment: Normal   Communication Communication Communication: No difficulties   Cognition Arousal/Alertness: Awake/alert Behavior During Therapy: WFL for tasks  assessed/performed Overall Cognitive Status: Within Functional Limits for tasks assessed                                     General Comments  VSS on RA.    Exercises     Shoulder Instructions      Home Living Family/patient expects to be discharged to:: Private residence Living Arrangements: Spouse/significant other Available Help at Discharge: Family;Available PRN/intermittently Type of Home: House Home Access: Stairs to enter [employer] of Steps: 2-3 Entrance Stairs-Rails: Right;Left Home Layout: One level     Bathroom Shower/Tub: Walk-in shower;Door   Bathroom Toilet: Handicapped height     Home Equipment: Environmental consultant - 2 wheels;Cane - single point;Crutches          Prior Functioning/Environment Level of Independence: Independent                 OT Problem List: Decreased activity tolerance      OT Treatment/Interventions:      OT Goals(Current goals can be found in the care plan section) Acute Rehab OT Goals Patient Stated Goal: to go home when symptoms have resolved.  OT Frequency:     Barriers to D/C:            Co-evaluation              AM-PAC OT "6 Clicks" Daily Activity     Outcome Measure Help from another person eating meals?: None Help from another person taking care of personal grooming?: None Help from another person toileting, which includes using toliet, bedpan, or urinal?: None Help from another person bathing (including washing, rinsing, drying)?: A Little Help from another person to put on and taking off regular upper body clothing?: None Help from another person to put on and taking off regular lower body clothing?: None 6 Click Score: 23   End of Session Nurse Communication: Mobility status  Activity Tolerance: Patient tolerated treatment well Patient left: in bed;with call bell/phone within reach (pt aware not to get OOB without assist)  OT Visit Diagnosis: Unsteadiness on feet  (R26.81);Dizziness and giddiness (R42)                Time: 3893-7342 OT Time Calculation (min): 14 min Charges:  OT General Charges $OT Visit: 1 Visit OT Evaluation $OT Eval Moderate Complexity: 1 Mod  Jefferey Pica, OTR/L Acute Rehabilitation Services Pager: [phone] Office: [phone]   LAGTXMI C 04/12/2020, 9:40 AM

## 2020-04-13 NOTE — Progress Notes (Addendum)
  Progress Note    04/13/2020 8:38 AM 3 Days Post-Op  Subjective:  States he feels good. Says that his left neck pain and headache are completely resolved since yesterday. He says he was able to ambulate in hallway yesterday with only minimal dizziness. He otherwise denies any trouble swallowing, facial drooping, slurred speech, weakness or numbness of his upper or lower extremities   Vitals:   04/13/20 0815 04/13/20 0823  BP: 125/73 125/73  Pulse:  (!) 58  Resp:  16  Temp:  97.8 F (36.6 C)  SpO2:  98%   Physical Exam: Cardiac:  regular Lungs:  Non labored Extremities: extremities well perfused and warm Neurologic: CN intact. Face symmetric. Tongue midline. Upper and lower extremity motor and strength 5/5 symmetric  CBC    Component Value Date/Time   WBC 7.7 04/11/2020 0716   RBC 4.56 04/11/2020 0716   HGB 14.7 04/11/2020 0716   HCT 45.0 04/11/2020 0716   PLT 392 04/11/2020 0716   MCV 98.7 04/11/2020 0716   MCH 32.2 04/11/2020 0716   MCHC 32.7 04/11/2020 0716   RDW 14.7 04/11/2020 0716   LYMPHSABS 1.1 09/02/2017 0546   MONOABS 1.0 09/02/2017 0546   EOSABS 0.1 09/02/2017 0546   BASOSABS 0.0 09/02/2017 0546    BMET    Component Value Date/Time   NA 138 04/11/2020 0716   K 4.0 04/11/2020 0716   CL 110 04/11/2020 0716   CO2 19 (L) 04/11/2020 0716   GLUCOSE 92 04/11/2020 0716   BUN 17 04/11/2020 0716   CREATININE 1.13 04/11/2020 0716   CALCIUM 8.1 (L) 04/11/2020 0716   GFRNONAA >60 04/11/2020 0716   GFRAA >60 04/11/2020 0716    INR No results found for: INR   Intake/Output Summary (Last 24 hours) at 04/13/2020 0838 Last data filed at 04/13/2020 0600 Gross per 24 hour  Intake 926 ml  Output --  Net 926 ml     Assessment/Plan:  64 y.o. male with high grade left ICA stenosis. New right frontal infarct on MRI over weekend. Improved left neck pain and headache. Continue to mobilize as tolerated. Plan is for TCAR stent on Wednesday 04/16/19 - on aspirin,  statin, plavix   Karoline Caldwell, PA-C [facility] [phone] 04/13/2020 8:38 AM   Agree with above.  Continue plavix asa statin Left TCAR on Wednesday  Ruta Hinds, MD [facility] [place] Office: [phone]

## 2020-04-13 NOTE — Progress Notes (Signed)
STROKE TEAM PROGRESS NOTE   INTERVAL HISTORY He states his dizziness and blurred vision episodes have improved but have not completely gone.  He had transient dizziness when he stood up to wash his face but rest of the day was fine and was up and around.  He has been seen by Dr. Juanda Cameron.  Vascular surgeon who now plans to do left carotid stent on Wednesday   OBJECTIVE Vitals:   04/13/20 0600 04/13/20 0815 04/13/20 0823 04/13/20 1153  BP: 119/71 125/73 125/73 105/64  Pulse:   (!) 58 (!) 53  Resp:   16 16  Temp:   97.8 F (36.6 C) 97.9 F (36.6 C)  TempSrc:   Oral Oral  SpO2:   98% 95%  Weight:      Height:        CBC:  Recent Labs  Lab 04/10/20 0436 04/11/20 0716  WBC 8.2 7.7  HGB 13.9 14.7  HCT 43.3 45.0  MCV 100.0 98.7  PLT 436* 704    Basic Metabolic Panel:  Recent Labs  Lab 04/10/20 0436 04/11/20 0716  NA 135 138  K 3.8 4.0  CL 110 110  CO2 17* 19*  GLUCOSE 108* 92  BUN 17 17  CREATININE 0.99 1.13  CALCIUM 8.1* 8.1*  MG 1.9  --     Lipid Panel:     Component Value Date/Time   CHOL 235 (H) 04/10/2020 0436   TRIG 96 04/10/2020 0436   HDL 32 (L) 04/10/2020 0436   CHOLHDL 7.3 04/10/2020 0436   VLDL 19 04/10/2020 0436   LDLCALC 184 (H) 04/10/2020 0436   HgbA1c:  Lab Results  Component Value Date   HGBA1C 5.7 (H) 04/11/2020   Urine Drug Screen: No results found for: LABOPIA, COCAINSCRNUR, LABBENZ, AMPHETMU, THCU, LABBARB  Alcohol Level No results found for: [place]  MR BRAIN WO CONTRAST 04/11/2020 IMPRESSION:  Two punctate right frontal acute infarcts.   CTA Neck 04/09/2020 IMPRESSION: 1. Negative CTA for acute dissection or other abnormality. 2. 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 Vascular surgery consultation and referral recommended for further evaluation and consideration of possible CEA. 3. Bulky mixed plaque about the right carotid bifurcation/proximal right ICA with associated  stenosis of up to 35% by NASCET criteria. 4. 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 5. 50% atheromatous stenosis at the origin of the left vertebral artery. Right vertebral artery is dominant.  CARDIAC CATHETERIZATION 04/10/2020 Prox RCA lesion is 65% stenosed.  LV end diastolic pressure is mildly elevated.   1. Nonobstructive CAD. The mid RCA has an eccentric stenosis. In some view it only appears 40% narrowed but in other views it appears to be 70%. DFR is normal indicating it is not flow limiting.  2. Mildly elevated LVEDP 18 mm Hg Plan: medical management.   ECHOCARDIOGRAM COMPLETE 04/10/2020 IMPRESSIONS   1. Left ventricular ejection fraction, by estimation, is 55 to 60%. The left ventricle has normal function. The left ventricle has no regional wall motion abnormalities. There is mild left ventricular hypertrophy. Left ventricular diastolic parameters are consistent with Grade I diastolic dysfunction (impaired relaxation).   2. Right ventricular systolic function is normal. The right ventricular size is normal.   3. The mitral valve is normal in structure. No evidence of mitral valve regurgitation. No evidence of mitral stenosis.   4. The aortic valve is normal in structure. Aortic valve regurgitation is mild to moderate. Mild aortic valve sclerosis  is present, with no evidence of aortic valve stenosis.   5. The inferior vena cava is normal in size with greater than 50% respiratory variability, suggesting right atrial pressure of 3 mmHg.   VAS US CAROTID 04/11/2020 Summary:   Left Carotid: 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 The ECA appears >50% stenosed.  Vertebrals: Left vertebral artery demonstrates antegrade flow.  ECG - SR rate 66 BPM. (See cardiology reading for complete details)  PHYSICAL EXAM Blood pressure 105/64, pulse (!) 53, temperature 97.9 F (36.6 C), temperature source Oral, resp. rate 16,  height 5\' 11"  (1.803 m), weight 100.3 kg, SpO2 95 %. Pleasant middle-age mildly obese Caucasian male not in distress. . Afebrile. Head is nontraumatic. Neck is supple without bruit.    Cardiac exam no murmur or gallop. Lungs are clear to auscultation. Distal pulses are well felt. Neurological Exam ;  Awake  Alert oriented x 3. Normal speech and language.eye movements full without nystagmus.fundi were not visualized. Vision acuity and fields appear normal. Hearing is normal. Palatal movements are normal. Face symmetric. Tongue midline. Normal strength, tone, reflexes and coordination. Normal sensation. Gait deferred.      ASSESSMENT/PLAN [name] is a 64 y.o. male with history of rheumatoid arthritis and osteoarthritis who presented to ED on 04/09/20 for evaluation of chest pain, palpitations and positional dizziness -> cardiac cath 7/16 ->  nonobstructive CAD -> CTA head and neck that showed a near occlusive left internal carotid stenosis -> scheduled for outpatient left internal carotid stent on 04/21/2020 with Dr. Oneida Cameron. MRI obtained for ongoing dizziness and intermittently blurred vision -> two punctate high right frontal areas of acute infarction. He did not receive IV t-PA as it was felt that strokes on MRI were likely incidental.  Stroke: Two punctate right frontal acute infarcts - likely embolic - source unknown.  Resultant no deficits code Stroke CT Head - not ordered   CT head - not ordered  MRI head - Two punctate right frontal acute infarcts  MRA head - not ordered  CTA Neck - Severe near occlusive atheromatous stenosis involving the proximal left ICA.  Short-segment 65% atheromatous stenosis within the proximal left  subclavian artery.  CT Perfusion - not ordered  Carotid Doppler - Left Carotid: 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 The ECA appears >50% stenosed.   2D Echo - EF 55 to 60%. No cardiac source of emboli identified.    Sars Corona Virus 2 - negative  LDL - 184  HgbA1c - 5.7  UDS - not ordered  VTE prophylaxis - none Diet  Diet Order            Diet Heart Room service appropriate? Yes; Fluid consistency: Thin  Diet effective now                 aspirin 81 mg daily prior to admission, now on aspirin 81 mg daily and clopidogrel 75 mg daily  Patient counseled to be compliant with his antithrombotic medications  Ongoing aggressive stroke risk factor management  Therapy recommendations:  Outpt PT recommended  Disposition:  Pending  Hypertension  Home BP meds: none   Current BP meds: none   Mildly low at times - need to avoid hypotension with known Lt carotid disease. Mar[place] Kitchen Permissive hypertension (OK if < 220/120) but gradually normalize in 5-7 days. . Long-term BP goal normotensive  Hyperlipidemia  Home Lipid lowering medication: none  LDL 184, goal <  70  Current lipid lowering medication: Lipitor 80 mg daily   Continue statin at discharge  Other Stroke Risk Factors  Advanced age  Former cigarette smoker - quit  Obesity, Body mass index is 30.84 kg/m., recommend weight loss, diet and exercise as appropriate   Nonobstructive CAD  Other Active Problems  Code status - Full code  RA - prednisone ; methotrexate ; tramadol ; Xeljanz  Bradycardia  Hospital day # 3 He presented with recurrent episodes of dizziness and left eye partial blurred vision while standing likely hemodynamic TIAs in the setting of near occlusive left carotid stenosis.  Agree with emergent left carotid revascularization with angioplasty stenting.  Continue aspirin and Plavix and aggressive risk factor modification.  Statin for elevated lipids.  Long discussion with patient   answered questions.   Stroke team will follow from distance left carotid stent on Wednesday  Antony Contras, MD Medical Director [facility] Pager: [phone] 04/13/2020 1:58 PM To contact Stroke Continuity  provider, please refer to [email or web address] After hours, contact General Neurology

## 2020-04-13 NOTE — Progress Notes (Signed)
PROGRESS NOTE    Ronald Cameron  IRW:[id number] DOB: 04-Jan-1956 DOA: 04/09/2020 PCP: Claretta Fraise, MD   Brief Narrative: Patient is a 64 year old male with history of rheumatoid arthritis, chronic pain syndrome, diverticulitis who presents to the emergency department with complaints of palpitations, chest discomfort.  On presentation, EKG showed inferolateral T wave inversions.  CT head was negative.  Acute intracranial abnormalities.  CT chest/abdomen/pelvis was negative for aortic dissection or aneurysm.  CTA neck was concerning of severe near occlusive atheromatous stenosis involving the left ICA.  Cardiology and vascular surgery consulted.  He underwent cardiac catheter with finding of nonobstructive  coronary disease.  Venous duplex of the carotid arteries confirmed 80 to 99% stenosis of the left ICA.  Vascular surgery initially  planning for stent placement as an outpatient.  He was planned for discharge on 04/11/20  but he continue to remain dizzy.  MRI of the brain showed 2 punctate right frontal acute infarcts.  Neurology consulted.  Due to suspicion of ongoing TIA, vascular surgery again notified.Plan for carotoid stenting on Wednesday.  Assessment & Plan:   Principal Problem:   Chest pain Active Problems:   Rheumatoid arthritis (HCC)   Carotid stenosis   Hypokalemia   Dizziness: Complained of  intermittent dizziness with left blurry vision while ambulating. Strong suspicion for TIA .Continue meclizine for symptomatic relief for now.    Acute nonhemorrhagic stroke: MRI of the brain showed 2 punctuate right frontal acute infarcts.  This is most likely associated with post cath complication and unlikely the cause of dizziness.  Initially stroke protocol.  Neurology following.  PT/OT/speech evaluation done.  Hemoglobin A1c of 5.7.  LDL of 184.  Continue Lipitor, aspirin, Plavix.  Severe carotid stenosis: CTA neck and venous duplex of the carotid artery confirmed severe stenosis  of the left ICA.  Vascular surgery was consulted.  Continue aspirin and Plavix.  Vascular surgery recommended to follow-up as an outpatient for stenting  but due to ongoing neurological symptom, we have reached out to vascular surgery again and  the procedure will be done  during this hospitalization on Wednesday  Chest pain/palpitations: Presented with 1 week history of intermittent palpitations, chest discomfort.  EKG showed T wave inversions.  Troponins were normal.  No acute pathology as per CT chest abdomen/pelvis.  Cardiology was consulted and he underwent cardiac cath which showed proximal RCA lesion with 60% stenosis but nonobstructive.  Echo showed ejection fraction of 55 to 60%, mild left ventricular hypertrophy, grade 1 diastolic dysfunction, no regional wall motion abnormalities.  Cardiology signed off.  Hypokalemia :supplemented and corrected  Rheumatoid arthritis: Stable.  Follow-up as an outpatient.  Takes Morrie Sheldon, prednisone and methotrexate  Hyperlipidemia: LDL of 184.  Started on Lipitor.         DVT prophylaxis:Lovenox Code Status: Full Family Communication: Wife on  phone on 04/11/2020 Status is: Inpatient  Remains inpatient appropriate because:Unsafe d/c plan   Dispo: The patient is from: Home              Anticipated d/c is to: Home              Anticipated d/c date is:3-4 days              Patient currently is not medically stable to d/c.  Waiting for vascular surgery intervention  Consultants: cardiology,vascular surgery  Procedures:Cardiac cath  Antimicrobials:  Anti-infectives (From admission, onward)   None      Subjective: Patient seen and examined the bedside this  morning.  Hemodynamically stable but comfortable.  No acute complaints.  Waiting for procedure from vascular surgery  Objective: Vitals:   04/13/20 0557 04/13/20 0600 04/13/20 0815 04/13/20 0823  BP: 119/71 119/71 125/73 125/73  Pulse: (!) 58   (!) 58  Resp: 15   16  Temp: 97.6  F (36.4 C)   97.8 F (36.6 C)  TempSrc: Oral   Oral  SpO2: 96%   98%  Weight:      Height:        Intake/Output Summary (Last 24 hours) at 04/13/2020 0836 Last data filed at 04/13/2020 0600 Gross per 24 hour  Intake 926 ml  Output --  Net 926 ml   Filed Weights   04/09/20 2039 04/12/20 0446 04/13/20 0557  Weight: 100.2 kg 101.1 kg 100.3 kg    Examination:  General exam: Appears calm and comfortable ,Not in distress,average built HEENT:PERRL,Oral mucosa moist, Ear/Nose normal on gross exam Respiratory system: Bilateral equal air entry, normal vesicular breath sounds, no wheezes or crackles  Cardiovascular system: S1 & S2 heard, RRR. No JVD, murmurs, rubs, gallops or clicks. Gastrointestinal system: Abdomen is nondistended, soft and nontender. No organomegaly or masses felt. Normal bowel sounds heard. Central nervous system: Alert and oriented. Extremities: No edema, no clubbing ,no cyanosis Skin: No rashes, lesions or ulcers,no icterus ,no pallor  Data Reviewed: I have personally reviewed following labs and imaging studies  CBC: Recent Labs  Lab 04/09/20 1536 04/10/20 0436 04/11/20 0716  WBC 8.8 8.2 7.7  HGB 14.6 13.9 14.7  HCT 44.2 43.3 45.0  MCV 97.1 100.0 98.7  PLT 467* 436* 270   Basic Metabolic Panel: Recent Labs  Lab 04/09/20 1536 04/10/20 0436 04/11/20 0716  NA 138 135 138  K 3.3* 3.8 4.0  CL 107 110 110  CO2 21* 17* 19*  GLUCOSE 134* 108* 92  BUN 18 17 17   CREATININE 1.20 0.99 1.13  CALCIUM 8.5* 8.1* 8.1*  MG  --  1.9  --    GFR: Estimated Creatinine Clearance: 82.8 mL/min (by C-G formula based on SCr of 1.13 mg/dL). Liver Function Tests: No results for input(s): AST, ALT, ALKPHOS, BILITOT, PROT, ALBUMIN in the last 168 hours. No results for input(s): LIPASE, AMYLASE in the last 168 hours. No results for input(s): AMMONIA in the last 168 hours. Coagulation Profile: No results for input(s): INR, PROTIME in the last 168 hours. Cardiac  Enzymes: Recent Labs  Lab 04/10/20 0436  CKTOTAL 42*   BNP (last 3 results) No results for input(s): PROBNP in the last 8760 hours. HbA1C: Recent Labs    04/11/20 0716  HGBA1C 5.7*   CBG: No results for input(s): GLUCAP in the last 168 hours. Lipid Profile: No results for input(s): CHOL, HDL, LDLCALC, TRIG, CHOLHDL, LDLDIRECT in the last 72 hours. Thyroid Function Tests: No results for input(s): TSH, T4TOTAL, FREET4, T3FREE, THYROIDAB in the last 72 hours. Anemia Panel: No results for input(s): VITAMINB12, FOLATE, FERRITIN, TIBC, IRON, RETICCTPCT in the last 72 hours. Sepsis Labs: No results for input(s): PROCALCITON, LATICACIDVEN in the last 168 hours.  Recent Results (from the past 240 hour(s))  SARS Coronavirus 2 by RT PCR (hospital order, performed in [facility] hospital lab) Nasopharyngeal Nasopharyngeal Swab     Status: None   Collection Time: 04/09/20 11:39 PM   Specimen: Nasopharyngeal Swab  Result Value Ref Range Status   SARS Coronavirus 2 NEGATIVE NEGATIVE Final    Comment: (NOTE) SARS-CoV-2 target nucleic acids are NOT DETECTED.  The  SARS-CoV-2 RNA is generally detectable in upper and lower respiratory specimens during the acute phase of infection. The lowest concentration of SARS-CoV-2 viral copies this assay can detect is 250 copies / mL. A negative result does not preclude SARS-CoV-2 infection and should not be used as the sole basis for treatment or other patient management decisions.  A negative result may occur with improper specimen collection / handling, submission of specimen other than nasopharyngeal swab, presence of viral mutation(s) within the areas targeted by this assay, and inadequate number of viral copies (<250 copies / mL). A negative result must be combined with clinical observations, patient history, and epidemiological information.  Fact Sheet for Patients:   StrictlyIdeas.no  Fact Sheet for Healthcare  Providers: BankingDealers.co.za  This test is not yet approved or  cleared by the [place] FDA and has been authorized for detection and/or diagnosis of SARS-CoV-2 by FDA under an Emergency Use Authorization (EUA).  This EUA will remain in effect (meaning this test can be used) for the duration of the COVID-19 declaration under Section 564(b)(1) of the Act, 21 U.S.C. section 360bbb-3(b)(1), unless the authorization is terminated or revoked sooner.  Performed at [facility], [place] [address] [place], [place] [address]          Radiology Studies: MR BRAIN WO CONTRAST  Result Date: 04/11/2020 CLINICAL DATA:  Dizziness EXAM: MRI HEAD WITHOUT CONTRAST TECHNIQUE: Multiplanar, multiecho pulse sequences of the brain and surrounding structures were obtained without intravenous contrast. COMPARISON:  None. FINDINGS: Brain: Two punctate nearby foci of diffusion hyperintensity at the posterior aspect of the right superior frontal gyrus. Small size limits evaluation on ADC. There is no evidence of intracranial hemorrhage. Patchy T2 hyperintensity in the supratentorial white matter is nonspecific but may reflect minor chronic microvascular ischemic changes. Ventricles and sulci are within normal limits in size and configuration. There is no intracranial mass or mass effect. There is no hydrocephalus or extra-axial fluid collection. Vascular: Major vessel flow voids at the skull base are preserved. Skull and upper cervical spine: Normal marrow signal is preserved. Sinuses/Orbits: Trace paranasal sinus mucosal thickening. Bilateral lens replacements. Other: Sella is unremarkable.  Mastoid air cells are clear. IMPRESSION: Two punctate right frontal acute infarcts. Electronically Signed   By: Macy Mis M.D.   On: 04/11/2020 16:18        Scheduled Meds: . aspirin  81 mg Oral Daily  . atorvastatin  80 mg Oral Daily  . clopidogrel  75 mg Oral Daily  . folic acid  1  mg Oral Daily  . predniSONE  10 mg Oral Daily  . sodium chloride flush  3 mL Intravenous Q12H  . sodium chloride flush  3 mL Intravenous Q12H  . sodium chloride flush  3 mL Intravenous Q12H  . Tofacitinib Citrate ER  11 mg Oral Daily   Continuous Infusions: . sodium chloride    . sodium chloride       LOS: 3 days    Time spent: 25 mins.More than 50% of that time was spent in counseling and/or coordination of care.      Shelly Coss, MD [facility] P7/19/2021, 8:36 AM

## 2020-04-14 NOTE — Plan of Care (Signed)
  Problem: Clinical Measurements: Goal: Ability to maintain clinical measurements within normal limits will improve Outcome: Progressing   Problem: Clinical Measurements: Goal: Respiratory complications will improve Outcome: Progressing   Problem: Cardiac: Goal: Ability to achieve and maintain adequate cardiovascular perfusion will improve Outcome: Not Progressing

## 2020-04-14 NOTE — Progress Notes (Signed)
Physical Therapy Treatment Patient Details Name: Ronald Cameron MRN: [id number] DOB: 28-Jun-1956 Today's Date: 04/14/2020    History of Present Illness Pt is a 64 y/o male admitted secondary to palpitations, chest discomfort and dizziness. Pt underwent cardiac cath which revealed He has > 80% left ICA stenosis with relatively high lesion extending to C2 level. Left TCAR stent planned for 7/27. 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 PMH including but not limited to RA and diverticulosis.     PT Comments    Pt was seen for expansion of challenge of PT tasks, including instruction of HEP and gait on hallway with monitoring of tolerance with HR and sats.  Pt tolerates well, and is a good student with exercise technique.  Follow up with him to increase ROM and strength to LE's to support progress with gait, along with monitoring of cardiac status with all exertion.  Outpatient therapy continues to be an appropriate dc transition.   Follow Up Recommendations  Outpatient PT;Supervision for mobility/OOB     Equipment Recommendations  None recommended by PT    Recommendations for Other Services Other (comment)     Precautions / Restrictions Precautions Precautions: Fall Precaution Comments: monitor O2 sats, pulses Restrictions Weight Bearing Restrictions: No    Mobility  Bed Mobility Overal bed mobility: Modified Independent                Transfers Overall transfer level: Needs assistance Equipment used: None Transfers: Sit to/from Stand Sit to Stand: Supervision            Ambulation/Gait Ambulation/Gait assistance: Min guard (for safety over recent dizziness) Gait Distance (Feet): 300 Feet Assistive device: None Gait Pattern/deviations: Step-through pattern;Wide base of support;Decreased stride length Gait velocity: reduced Gait velocity interpretation: <1.31 ft/sec, indicative of household ambulator General Gait Details: pt did not lose  balance but was able to navigate obstacles on hall, sats remained in 90's   Stairs             Wheelchair Mobility    Modified Rankin (Stroke Patients Only)       Balance Overall balance assessment: Needs assistance Sitting-balance support: Feet supported Sitting balance-Leahy Scale: Good     Standing balance support: No upper extremity supported;During functional activity Standing balance-Leahy Scale: Good Standing balance comment: fair dynamic balance                            Cognition Arousal/Alertness: Awake/alert Behavior During Therapy: WFL for tasks assessed/performed Overall Cognitive Status: Within Functional Limits for tasks assessed                                        Exercises General Exercises - Lower Extremity Ankle Circles/Pumps: AROM;5 reps Quad Sets: AROM;10 reps Gluteal Sets: AROM;10 reps Heel Slides: AROM;10 reps Hip ABduction/ADduction: AROM;10 reps Straight Leg Raises: AROM;10 reps Hip Flexion/Marching: AROM;Supine    General Comments        Pertinent Vitals/Pain Pain Assessment: Faces Pain Score: 4  Faces Pain Scale: Hurts little more Pain Location: neck and legs Pain Descriptors / Indicators: Guarding Pain Intervention(s): Monitored during session;Repositioned    Home Living                      Prior Function            PT  Goals (current goals can now be found in the care plan section) Acute Rehab PT Goals Patient Stated Goal: to go home when symptoms have resolved. Progress towards PT goals: Progressing toward goals    Frequency    Min 3X/week      PT Plan Current plan remains appropriate    Co-evaluation              AM-PAC PT "6 Clicks" Mobility   Outcome Measure  Help needed turning from your back to your side while in a flat bed without using bedrails?: None Help needed moving from lying on your back to sitting on the side of a flat bed without using  bedrails?: None Help needed moving to and from a bed to a chair (including a wheelchair)?: None Help needed standing up from a chair using your arms (e.g., wheelchair or bedside chair)?: A Little Help needed to walk in hospital room?: A Little Help needed climbing 3-5 steps with a railing? : A Little 6 Click Score: 21    End of Session   Activity Tolerance: Patient tolerated treatment well Patient left: in bed;with call bell/phone within reach;with family/visitor present Nurse Communication: Mobility status PT Visit Diagnosis: Other abnormalities of gait and mobility (R26.89);Dizziness and giddiness (R42)     Time: 2081-3887 PT Time Calculation (min) (ACUTE ONLY): 27 min  Charges:  $Gait Training: 8-22 mins $Therapeutic Exercise: 8-22 mins                    Ramond Dial 04/14/2020, 5:14 PM  Mee Hives, PT MS Acute Rehab Dept. Number: [place] and [place]

## 2020-04-14 NOTE — Progress Notes (Signed)
Pre op'd for left TCAR 04/15/20 with Dr. Oneida Alar. Consent ordered. Morning labs. NPO after midnight  Paulo Fruit PA-C [facility] Office: [phone]

## 2020-04-14 NOTE — Progress Notes (Signed)
PROGRESS NOTE    Ronald Cameron  VHQ:[id number] DOB: 01/30/1956 DOA: 04/09/2020 PCP: Claretta Fraise, MD   Brief Narrative: Patient is a 64 year old male with history of rheumatoid arthritis, chronic pain syndrome, diverticulitis who presents to the emergency department with complaints of palpitations, chest discomfort.  On presentation, EKG showed inferolateral T wave inversions.  CT head was negative.  Acute intracranial abnormalities.  CT chest/abdomen/pelvis was negative for aortic dissection or aneurysm.  CTA neck was concerning of severe near occlusive atheromatous stenosis involving the left ICA.  Cardiology and vascular surgery consulted.  He underwent cardiac catheter with finding of nonobstructive  coronary disease.  Venous duplex of the carotid arteries confirmed 80 to 99% stenosis of the left ICA.  Vascular surgery initially  planning for stent placement as an outpatient.  He was planned for discharge on 04/11/20  but he continue to remain dizzy.  MRI of the brain showed 2 punctate right frontal acute infarcts.  Neurology consulted.  Due to suspicion of ongoing TIA, vascular surgery again notified.Plan for carotoid stenting on Wednesday.  Assessment & Plan:   Principal Problem:   Chest pain Active Problems:   Rheumatoid arthritis (HCC)   Carotid stenosis   Hypokalemia   Dizziness: Complained of  intermittent dizziness with left blurry vision while ambulating. Strong suspicion for TIA .Continue meclizine for symptomatic relief for now.  The symptoms have currently resolved.  Acute nonhemorrhagic stroke: MRI of the brain showed 2 punctuate right frontal acute infarcts.  This is most likely associated with post cath complication and unlikely the cause of dizziness.  Initially stroke protocol.  Neurology following.  PT/OT/speech evaluation done.  Hemoglobin A1c of 5.7.  LDL of 184.  Continue Lipitor, aspirin, Plavix.  Severe carotid stenosis: CTA neck and venous duplex of the  carotid artery confirmed severe stenosis of the left ICA.  Vascular surgery was consulted.  Continue aspirin and Plavix.  Vascular surgery recommended to follow-up as an outpatient for stenting  but due to ongoing neurological symptom, we have reached out to vascular surgery again and  the procedure will be done  during this hospitalization on Wednesday  Chest pain/palpitations: Presented with 1 week history of intermittent palpitations, chest discomfort.  EKG showed T wave inversions.  Troponins were normal.  No acute pathology as per CT chest abdomen/pelvis.  Cardiology was consulted and he underwent cardiac cath which showed proximal RCA lesion with 60% stenosis but nonobstructive.  Echo showed ejection fraction of 55 to 60%, mild left ventricular hypertrophy, grade 1 diastolic dysfunction, no regional wall motion abnormalities.  Cardiology signed off.  Hypokalemia :supplemented and corrected  Rheumatoid arthritis: Stable.  Follow-up as an outpatient.  Takes Morrie Sheldon, prednisone and methotrexate  Hyperlipidemia: LDL of 184.  Started on Lipitor.         DVT prophylaxis:Lovenox Code Status: Full Family Communication: Wife on  phone on 04/11/2020 Status is: Inpatient  Remains inpatient appropriate because:Unsafe d/c plan   Dispo: The patient is from: Home              Anticipated d/c is to: Home              Anticipated d/c date is: 1-2 days              Patient currently is not medically stable to d/c.  Waiting for vascular surgery intervention  Consultants: cardiology,vascular surgery  Procedures:Cardiac cath  Antimicrobials:  Anti-infectives (From admission, onward)   None      Subjective: Patient seen  and examined at the bedside this morning.  Hemodynamically stable.  Comfortable.  No complaints of dizziness or blurry vision.  Waiting for the procedure.  Objective: Vitals:   04/13/20 1624 04/13/20 2044 04/14/20 0543 04/14/20 0830  BP: 109/68 108/66 123/74 128/81    Pulse: 61 60 (!) 58   Resp: 16 18 16 15   Temp: 98.2 F (36.8 C) 98 F (36.7 C) 97.6 F (36.4 C)   TempSrc: Oral Oral Oral   SpO2: 99% 99% 95%   Weight:   99.9 kg   Height:        Intake/Output Summary (Last 24 hours) at 04/14/2020 3570 Last data filed at 04/13/2020 2012 Gross per 24 hour  Intake 3 ml  Output --  Net 3 ml   Filed Weights   04/12/20 0446 04/13/20 0557 04/14/20 0543  Weight: 101.1 kg 100.3 kg 99.9 kg    Examination:   General exam: Appears calm and comfortable ,Not in distress,average built HEENT:PERRL,Oral mucosa moist, Ear/Nose normal on gross exam Respiratory system: Bilateral equal air entry, normal vesicular breath sounds, no wheezes or crackles  Cardiovascular system: S1 & S2 heard, RRR. No JVD, murmurs, rubs, gallops or clicks. Gastrointestinal system: Abdomen is nondistended, soft and nontender. No organomegaly or masses felt. Normal bowel sounds heard. Central nervous system: Alert and oriented. No focal neurological deficits. Extremities: No edema, no clubbing ,no cyanosis Skin: No rashes, lesions or ulcers,no icterus ,no pallor    Data Reviewed: I have personally reviewed following labs and imaging studies  CBC: Recent Labs  Lab 04/09/20 1536 04/10/20 0436 04/11/20 0716  WBC 8.8 8.2 7.7  HGB 14.6 13.9 14.7  HCT 44.2 43.3 45.0  MCV 97.1 100.0 98.7  PLT 467* 436* 177   Basic Metabolic Panel: Recent Labs  Lab 04/09/20 1536 04/10/20 0436 04/11/20 0716  NA 138 135 138  K 3.3* 3.8 4.0  CL 107 110 110  CO2 21* 17* 19*  GLUCOSE 134* 108* 92  BUN 18 17 17   CREATININE 1.20 0.99 1.13  CALCIUM 8.5* 8.1* 8.1*  MG  --  1.9  --    GFR: Estimated Creatinine Clearance: 82.6 mL/min (by C-G formula based on SCr of 1.13 mg/dL). Liver Function Tests: No results for input(s): AST, ALT, ALKPHOS, BILITOT, PROT, ALBUMIN in the last 168 hours. No results for input(s): LIPASE, AMYLASE in the last 168 hours. No results for input(s): AMMONIA in the  last 168 hours. Coagulation Profile: No results for input(s): INR, PROTIME in the last 168 hours. Cardiac Enzymes: Recent Labs  Lab 04/10/20 0436  CKTOTAL 42*   BNP (last 3 results) No results for input(s): PROBNP in the last 8760 hours. HbA1C: No results for input(s): HGBA1C in the last 72 hours. CBG: No results for input(s): GLUCAP in the last 168 hours. Lipid Profile: No results for input(s): CHOL, HDL, LDLCALC, TRIG, CHOLHDL, LDLDIRECT in the last 72 hours. Thyroid Function Tests: No results for input(s): TSH, T4TOTAL, FREET4, T3FREE, THYROIDAB in the last 72 hours. Anemia Panel: No results for input(s): VITAMINB12, FOLATE, FERRITIN, TIBC, IRON, RETICCTPCT in the last 72 hours. Sepsis Labs: No results for input(s): PROCALCITON, LATICACIDVEN in the last 168 hours.  Recent Results (from the past 240 hour(s))  SARS Coronavirus 2 by RT PCR (hospital order, performed in [facility] hospital lab) Nasopharyngeal Nasopharyngeal Swab     Status: None   Collection Time: 04/09/20 11:39 PM   Specimen: Nasopharyngeal Swab  Result Value Ref Range Status   SARS Coronavirus 2  NEGATIVE NEGATIVE Final    Comment: (NOTE) SARS-CoV-2 target nucleic acids are NOT DETECTED.  The SARS-CoV-2 RNA is generally detectable in upper and lower respiratory specimens during the acute phase of infection. The lowest concentration of SARS-CoV-2 viral copies this assay can detect is 250 copies / mL. A negative result does not preclude SARS-CoV-2 infection and should not be used as the sole basis for treatment or other patient management decisions.  A negative result may occur with improper specimen collection / handling, submission of specimen other than nasopharyngeal swab, presence of viral mutation(s) within the areas targeted by this assay, and inadequate number of viral copies (<250 copies / mL). A negative result must be combined with clinical observations, patient history, and epidemiological  information.  Fact Sheet for Patients:   StrictlyIdeas.no  Fact Sheet for Healthcare Providers: BankingDealers.co.za  This test is not yet approved or  cleared by the [place] FDA and has been authorized for detection and/or diagnosis of SARS-CoV-2 by FDA under an Emergency Use Authorization (EUA).  This EUA will remain in effect (meaning this test can be used) for the duration of the COVID-19 declaration under Section 564(b)(1) of the Act, 21 U.S.C. section 360bbb-3(b)(1), unless the authorization is terminated or revoked sooner.  Performed at [facility], [place] [address], [place], [place] [address]          Radiology Studies: No results found.      Scheduled Meds: . aspirin  81 mg Oral Daily  . atorvastatin  80 mg Oral Daily  . clopidogrel  75 mg Oral Daily  . folic acid  1 mg Oral Daily  . predniSONE  10 mg Oral Daily  . sodium chloride flush  3 mL Intravenous Q12H  . sodium chloride flush  3 mL Intravenous Q12H  . sodium chloride flush  3 mL Intravenous Q12H  . Tofacitinib Citrate ER  11 mg Oral Daily   Continuous Infusions: . sodium chloride    . sodium chloride       LOS: 4 days    Time spent: 25 mins.More than 50% of that time was spent in counseling and/or coordination of care.      Shelly Coss, MD [facility] P7/20/2021, 8:52 AM

## 2020-04-15 ENCOUNTER — Encounter (HOSPITAL_COMMUNITY)
Admission: EM | Disposition: A | Discharge status: Home / Self Care | Payer: Self-pay | Source: Home / Self Care | Attending: Internal Medicine

## 2020-04-15 ENCOUNTER — Inpatient Hospital Stay (HOSPITAL_COMMUNITY): Payer: PPO | Admitting: Anesthesiology

## 2020-04-15 ENCOUNTER — Encounter (HOSPITAL_COMMUNITY): Payer: Self-pay | Admitting: Family Medicine

## 2020-04-15 ENCOUNTER — Inpatient Hospital Stay (HOSPITAL_COMMUNITY): Payer: PPO

## 2020-04-15 DIAGNOSIS — I6522 Occlusion and stenosis of left carotid artery: Secondary | ICD-10-CM

## 2020-04-15 HISTORY — PX: ULTRASOUND GUIDANCE FOR VASCULAR ACCESS: SHX6516

## 2020-04-15 HISTORY — PX: ENDOVASCULAR STENT INSERTION: SHX5161

## 2020-04-15 HISTORY — PX: TRANSCAROTID ARTERY REVASCULARIZATIONÂ: SHX6778

## 2020-04-15 LAB — CBC
HCT: 45.3 % (ref 39.0–52.0)
HCT: 43.1 % (ref 39.0–52.0)
Hemoglobin: 14.9 g/dL (ref 13.0–17.0)
Hemoglobin: 14 g/dL (ref 13.0–17.0)
MCH: 31.8 pg (ref 26.0–34.0)
MCH: 31.9 pg (ref 26.0–34.0)
MCHC: 32.9 g/dL (ref 30.0–36.0)
MCHC: 32.5 g/dL (ref 30.0–36.0)
MCV: 96.8 fL (ref 80.0–100.0)
MCV: 98.2 fL (ref 80.0–100.0)
Platelets: 423 10*3/uL — ABNORMAL HIGH (ref 150–400)
Platelets: 402 10*3/uL — ABNORMAL HIGH (ref 150–400)
RBC: 4.68 MIL/uL (ref 4.22–5.81)
RBC: 4.39 MIL/uL (ref 4.22–5.81)
RDW: 14.6 % (ref 11.5–15.5)
RDW: 14.5 % (ref 11.5–15.5)
WBC: 9.5 10*3/uL (ref 4.0–10.5)
WBC: 11.8 10*3/uL — ABNORMAL HIGH (ref 4.0–10.5)
nRBC: 0 % (ref 0.0–0.2)
nRBC: 0 % (ref 0.0–0.2)

## 2020-04-15 LAB — BASIC METABOLIC PANEL
Anion gap: 9 (ref 5–15)
BUN: 17 mg/dL (ref 8–23)
CO2: 21 mmol/L — ABNORMAL LOW (ref 22–32)
Calcium: 8.5 mg/dL — ABNORMAL LOW (ref 8.9–10.3)
Chloride: 109 mmol/L (ref 98–111)
Creatinine, Ser: 1.23 mg/dL (ref 0.61–1.24)
GFR calc Af Amer: >60 mL/min (ref >60)
GFR calc non Af Amer: >60 mL/min (ref >60)
Glucose, Bld: 78 mg/dL (ref 70–99)
Potassium: 3.8 mmol/L (ref 3.5–5.1)
Sodium: 139 mmol/L (ref 135–145)

## 2020-04-15 LAB — CREATININE, SERUM
Creatinine, Ser: 1.14 mg/dL (ref 0.61–1.24)
GFR calc Af Amer: >60 mL/min (ref >60)
GFR calc non Af Amer: >60 mL/min (ref >60)

## 2020-04-15 LAB — SURGICAL PCR SCREEN
MRSA, PCR: NEGATIVE
Staphylococcus aureus: NEGATIVE

## 2020-04-15 SURGERY — TRANSCAROTID ARTERY REVASCULARIZATION (TCAR)
Anesthesia: General | Site: Neck | Laterality: Right

## 2020-04-15 MED ORDER — PHENYLEPHRINE HCL-NACL 10-0.9 MG/250ML-% IV SOLN
INTRAVENOUS | Status: DC | PRN
Start: 2020-04-15 — End: 2020-04-15
  Administered 2020-04-15: 30 ug/min via INTRAVENOUS

## 2020-04-15 MED ORDER — ONDANSETRON HCL 4 MG/2ML IJ SOLN
INTRAMUSCULAR | Status: DC | PRN
  Administered 2020-04-15: 4 mg via INTRAVENOUS

## 2020-04-15 MED ORDER — CEFAZOLIN SODIUM-DEXTROSE 2-3 GM-%(50ML) IV SOLR
INTRAVENOUS | Status: DC | PRN
  Administered 2020-04-15: 2 g via INTRAVENOUS

## 2020-04-15 MED ORDER — PHENOL 1.4 % MT LIQD: 1.0000 | OROMUCOSAL | Status: DC | PRN

## 2020-04-15 MED ORDER — LACTATED RINGERS IV BOLUS
1000.0000 mL | Freq: Once | INTRAVENOUS | Status: AC
  Administered 2020-04-15: 1000 mL via INTRAVENOUS

## 2020-04-15 MED ORDER — 0.9 % SODIUM CHLORIDE (POUR BTL) OPTIME
TOPICAL | Status: DC | PRN
  Administered 2020-04-15: 1000 mL

## 2020-04-15 MED ORDER — GUAIFENESIN-DM 100-10 MG/5ML PO SYRP: 15.0000 mL | ORAL_SOLUTION | ORAL | Status: DC | PRN

## 2020-04-15 MED ORDER — HYDRALAZINE HCL 20 MG/ML IJ SOLN: 5.0000 mg | INTRAMUSCULAR | Status: DC | PRN

## 2020-04-15 MED ORDER — FENTANYL CITRATE (PF) 100 MCG/2ML IJ SOLN
INTRAMUSCULAR | Status: DC | PRN
  Administered 2020-04-15 (×2): 50 ug via INTRAVENOUS
  Administered 2020-04-15: 100 ug via INTRAVENOUS

## 2020-04-15 MED ORDER — HEPARIN SODIUM (PORCINE) 5000 UNIT/ML IJ SOLN
5000.0000 [IU] | Freq: Three times a day (TID) | INTRAMUSCULAR | Status: DC
  Administered 2020-04-16 – 2020-04-19 (×10): 5000 [IU] via SUBCUTANEOUS
  Filled 2020-04-15 (×9): qty 1

## 2020-04-15 MED ORDER — FENTANYL CITRATE (PF) 100 MCG/2ML IJ SOLN: 25.0000 ug | INTRAMUSCULAR | Status: DC | PRN

## 2020-04-15 MED ORDER — SODIUM CHLORIDE 0.9 % IV SOLN
INTRAVENOUS | Status: DC | PRN
  Administered 2020-04-15: 500 mL

## 2020-04-15 MED ORDER — CELECOXIB 200 MG PO CAPS
200.0000 mg | ORAL_CAPSULE | Freq: Once | ORAL | Status: AC
  Administered 2020-04-15: 200 mg via ORAL
  Filled 2020-04-15 (×2): qty 1

## 2020-04-15 MED ORDER — ACETAMINOPHEN 500 MG PO TABS
1000.0000 mg | ORAL_TABLET | Freq: Once | ORAL | Status: AC
  Administered 2020-04-15: 1000 mg via ORAL
  Filled 2020-04-15: qty 2

## 2020-04-15 MED ORDER — LACTATED RINGERS IV SOLN: INTRAVENOUS | Status: DC | PRN

## 2020-04-15 MED ORDER — POTASSIUM CHLORIDE CRYS ER 20 MEQ PO TBCR: 20.0000 meq | EXTENDED_RELEASE_TABLET | Freq: Every day | ORAL | Status: DC | PRN

## 2020-04-15 MED ORDER — MIDAZOLAM HCL 2 MG/2ML IJ SOLN
INTRAMUSCULAR | Status: AC
  Filled 2020-04-15: qty 2

## 2020-04-15 MED ORDER — SODIUM CHLORIDE 0.9 % IV SOLN
INTRAVENOUS | Status: AC
  Filled 2020-04-15: qty 1.2

## 2020-04-15 MED ORDER — DOCUSATE SODIUM 100 MG PO CAPS
100.0000 mg | ORAL_CAPSULE | Freq: Every day | ORAL | Status: DC
  Administered 2020-04-16 – 2020-04-19 (×4): 100 mg via ORAL
  Filled 2020-04-15 (×4): qty 1

## 2020-04-15 MED ORDER — LABETALOL HCL 5 MG/ML IV SOLN: 10.0000 mg | INTRAVENOUS | Status: DC | PRN

## 2020-04-15 MED ORDER — PROPOFOL 10 MG/ML IV BOLUS
INTRAVENOUS | Status: DC | PRN
  Administered 2020-04-15: 200 mg via INTRAVENOUS

## 2020-04-15 MED ORDER — ALUM & MAG HYDROXIDE-SIMETH 200-200-20 MG/5ML PO SUSP
15.0000 mL | ORAL | Status: DC | PRN
  Administered 2020-04-17: 30 mL via ORAL
  Filled 2020-04-15: qty 30

## 2020-04-15 MED ORDER — GLYCOPYRROLATE 0.2 MG/ML IJ SOLN
INTRAMUSCULAR | Status: DC | PRN
  Administered 2020-04-15: .2 mg via INTRAVENOUS

## 2020-04-15 MED ORDER — LACTATED RINGERS IV SOLN: INTRAVENOUS | Status: DC

## 2020-04-15 MED ORDER — ACETAMINOPHEN 325 MG PO TABS: 325.0000 mg | ORAL_TABLET | ORAL | Status: DC | PRN

## 2020-04-15 MED ORDER — MORPHINE SULFATE (PF) 2 MG/ML IV SOLN
2.0000 mg | INTRAVENOUS | Status: DC | PRN
  Administered 2020-04-16: 2 mg via INTRAVENOUS
  Filled 2020-04-15: qty 1

## 2020-04-15 MED ORDER — SODIUM CHLORIDE 0.9 % IV SOLN
500.0000 mL | Freq: Once | INTRAVENOUS | Status: AC | PRN
  Administered 2020-04-15: 500 mL via INTRAVENOUS

## 2020-04-15 MED ORDER — ACETAMINOPHEN 650 MG RE SUPP: 325.0000 mg | RECTAL | Status: DC | PRN

## 2020-04-15 MED ORDER — PANTOPRAZOLE SODIUM 40 MG PO TBEC
40.0000 mg | DELAYED_RELEASE_TABLET | Freq: Every day | ORAL | Status: DC
  Administered 2020-04-15 – 2020-04-19 (×5): 40 mg via ORAL
  Filled 2020-04-15 (×5): qty 1

## 2020-04-15 MED ORDER — CEFAZOLIN SODIUM-DEXTROSE 2-4 GM/100ML-% IV SOLN
2.0000 g | Freq: Three times a day (TID) | INTRAVENOUS | Status: AC
  Administered 2020-04-15 – 2020-04-16 (×2): 2 g via INTRAVENOUS
  Filled 2020-04-15 (×2): qty 100

## 2020-04-15 MED ORDER — EPHEDRINE SULFATE 50 MG/ML IJ SOLN
INTRAMUSCULAR | Status: DC | PRN
  Administered 2020-04-15: 2.5 mg via INTRAVENOUS
  Administered 2020-04-15: 5 mg via INTRAVENOUS
  Administered 2020-04-15: 2.5 mg via INTRAVENOUS

## 2020-04-15 MED ORDER — METOPROLOL TARTRATE 5 MG/5ML IV SOLN: 2.0000 mg | INTRAVENOUS | Status: DC | PRN

## 2020-04-15 MED ORDER — ROCURONIUM BROMIDE 100 MG/10ML IV SOLN
INTRAVENOUS | Status: DC | PRN
  Administered 2020-04-15: 100 mg via INTRAVENOUS

## 2020-04-15 MED ORDER — CHLORHEXIDINE GLUCONATE 0.12 % MT SOLN
15.0000 mL | OROMUCOSAL | Status: AC
  Administered 2020-04-15: 15 mL via OROMUCOSAL
  Filled 2020-04-15 (×2): qty 15

## 2020-04-15 MED ORDER — OXYCODONE-ACETAMINOPHEN 5-325 MG PO TABS
ORAL_TABLET | ORAL | Status: AC
  Filled 2020-04-15: qty 1

## 2020-04-15 MED ORDER — SUGAMMADEX SODIUM 200 MG/2ML IV SOLN
INTRAVENOUS | Status: DC | PRN
  Administered 2020-04-15: 200 mg via INTRAVENOUS

## 2020-04-15 MED ORDER — FENTANYL CITRATE (PF) 250 MCG/5ML IJ SOLN
INTRAMUSCULAR | Status: AC
  Filled 2020-04-15: qty 5

## 2020-04-15 MED ORDER — OXYCODONE-ACETAMINOPHEN 5-325 MG PO TABS
1.0000 | ORAL_TABLET | ORAL | Status: DC | PRN
  Administered 2020-04-15 – 2020-04-16 (×2): 1 via ORAL
  Filled 2020-04-15: qty 1

## 2020-04-15 MED ORDER — MAGNESIUM SULFATE 2 GM/50ML IV SOLN: 2.0000 g | Freq: Every day | INTRAVENOUS | Status: DC | PRN

## 2020-04-15 MED ORDER — PROMETHAZINE HCL 25 MG/ML IJ SOLN: 6.2500 mg | INTRAMUSCULAR | Status: DC | PRN

## 2020-04-15 MED ORDER — PROTAMINE SULFATE 10 MG/ML IV SOLN
INTRAVENOUS | Status: DC | PRN
  Administered 2020-04-15: 20 mg via INTRAVENOUS
  Administered 2020-04-15: 50 mg via INTRAVENOUS
  Administered 2020-04-15: 30 mg via INTRAVENOUS

## 2020-04-15 MED ORDER — HEPARIN SODIUM (PORCINE) 1000 UNIT/ML IJ SOLN
INTRAMUSCULAR | Status: DC | PRN
Start: 2020-04-15 — End: 2020-04-15
  Administered 2020-04-15: 10000 [IU] via INTRAVENOUS

## 2020-04-15 MED ORDER — IODIXANOL 320 MG/ML IV SOLN
INTRAVENOUS | Status: DC | PRN
  Administered 2020-04-15: 50 mL

## 2020-04-15 MED ORDER — LIDOCAINE HCL (CARDIAC) PF 100 MG/5ML IV SOSY
PREFILLED_SYRINGE | INTRAVENOUS | Status: DC | PRN
  Administered 2020-04-15: 100 mg via INTRAVENOUS

## 2020-04-15 SURGICAL SUPPLY — 72 items
ADH SKN CLS APL DERMABOND .7 (GAUZE/BANDAGES/DRESSINGS) ×6
AGENT HMST SPONGE THK3/8 (HEMOSTASIS)
BAG BANDED W/RUBBER/TAPE 36X54 (MISCELLANEOUS) ×4 IMPLANT
BAG EQP BAND 135X91 W/RBR TAPE (MISCELLANEOUS) ×3
BALLN STERLING RX 5X30X80 (BALLOONS) ×4
BALLOON STERLING RX 5X30X80 (BALLOONS) IMPLANT
CANISTER SUCT 3000ML PPV (MISCELLANEOUS) ×4 IMPLANT
CANNULA VESSEL 3MM 2 BLNT TIP (CANNULA) ×5 IMPLANT
CATH ROBINSON RED A/P 18FR (CATHETERS) IMPLANT
CLIP VESOCCLUDE MED 6/CT (CLIP) ×4 IMPLANT
CLIP VESOCCLUDE SM WIDE 6/CT (CLIP) ×4 IMPLANT
COVER DOME SNAP 22 D (MISCELLANEOUS) ×4 IMPLANT
COVER PROBE W GEL 5X96 (DRAPES) ×4 IMPLANT
COVER WAND RF STERILE (DRAPES) ×4 IMPLANT
DECANTER SPIKE VIAL GLASS SM (MISCELLANEOUS) IMPLANT
DERMABOND ADVANCED (GAUZE/BANDAGES/DRESSINGS) ×2
DERMABOND ADVANCED .7 DNX12 (GAUZE/BANDAGES/DRESSINGS) ×3 IMPLANT
DRAIN HEMOVAC 1/8 X 5 (WOUND CARE) IMPLANT
DRAPE INCISE IOBAN 66X45 STRL (DRAPES) ×8 IMPLANT
ELECT REM PT RETURN 9FT ADLT (ELECTROSURGICAL) ×4
ELECTRODE REM PT RTRN 9FT ADLT (ELECTROSURGICAL) ×3 IMPLANT
EVACUATOR SILICONE 100CC (DRAIN) IMPLANT
GLOVE BIO SURGEON STRL SZ7.5 (GLOVE) ×6 IMPLANT
GLOVE BIOGEL PI IND STRL 8 (GLOVE) IMPLANT
GLOVE BIOGEL PI INDICATOR 8 (GLOVE) ×2
GOWN STRL REUS W/ TWL LRG LVL3 (GOWN DISPOSABLE) ×9 IMPLANT
GOWN STRL REUS W/TWL LRG LVL3 (GOWN DISPOSABLE) ×12
GUIDEWIRE ENROUTE 0.014 (WIRE) ×1 IMPLANT
HEMOSTAT SPONGE AVITENE ULTRA (HEMOSTASIS) IMPLANT
INTRODUCER KIT GALT 7CM (INTRODUCER) ×8
KIT BASIN OR (CUSTOM PROCEDURE TRAY) ×4 IMPLANT
KIT ENCORE 26 ADVANTAGE (KITS) ×4 IMPLANT
KIT INTRODUCER GALT 7 (INTRODUCER) ×3 IMPLANT
KIT TURNOVER KIT B (KITS) ×4 IMPLANT
NDL HYPO 25GX1X1/2 BEV (NEEDLE) IMPLANT
NDL PERC 18GX7CM (NEEDLE) ×3 IMPLANT
NEEDLE HYPO 25GX1X1/2 BEV (NEEDLE) IMPLANT
NEEDLE PERC 18GX7CM (NEEDLE) ×4 IMPLANT
NS IRRIG 1000ML POUR BTL (IV SOLUTION) ×8 IMPLANT
PACK CAROTID (CUSTOM PROCEDURE TRAY) ×4 IMPLANT
PACK UNIVERSAL I (CUSTOM PROCEDURE TRAY) ×4 IMPLANT
PAD ARMBOARD 7.5X6 YLW CONV (MISCELLANEOUS) ×8 IMPLANT
POSITIONER HEAD DONUT 9IN (MISCELLANEOUS) ×4 IMPLANT
PROTECTION STATION PRESSURIZED (MISCELLANEOUS) ×4
SET MICROPUNCTURE 5F STIFF (MISCELLANEOUS) ×5 IMPLANT
SHEATH AVANTI 11CM 5FR (SHEATH) IMPLANT
SHUNT CAROTID BYPASS 10 (VASCULAR PRODUCTS) IMPLANT
SHUNT CAROTID BYPASS 12FRX15.5 (VASCULAR PRODUCTS) IMPLANT
STATION PROTECTION PRESSURIZED (MISCELLANEOUS) ×3 IMPLANT
STENT TRANSCAROTID SYS 10X40 (Permanent Stent) ×1 IMPLANT
STOPCOCK MORSE 400PSI 3WAY (MISCELLANEOUS) ×4 IMPLANT
SUT ETHILON 3 0 PS 1 (SUTURE) IMPLANT
SUT PROLENE 6 0 CC (SUTURE) ×4 IMPLANT
SUT SILK 2 0 PERMA HAND 18 BK (SUTURE) ×4 IMPLANT
SUT SILK 2 0SH CR/8 30 (SUTURE) ×4 IMPLANT
SUT SILK 3 0 TIES 17X18 (SUTURE)
SUT SILK 3-0 18XBRD TIE BLK (SUTURE) IMPLANT
SUT VIC AB 3-0 SH 27 (SUTURE) ×4
SUT VIC AB 3-0 SH 27X BRD (SUTURE) ×3 IMPLANT
SUT VICRYL 4-0 PS2 18IN ABS (SUTURE) ×4 IMPLANT
SYR 10ML LL (SYRINGE) ×12 IMPLANT
SYR 20ML LL LF (SYRINGE) ×4 IMPLANT
SYR 5ML LL (SYRINGE) ×4 IMPLANT
SYR CONTROL 10ML LL (SYRINGE) IMPLANT
SYSTEM TRANSCAROTID NEUROPRTCT (MISCELLANEOUS) IMPLANT
TOWEL GREEN STERILE (TOWEL DISPOSABLE) ×4 IMPLANT
TRANSCAROTID NEUROPROTECT SYS (MISCELLANEOUS) ×4
TUBING EXTENTION W/L.L. (IV SETS) ×4 IMPLANT
WATER STERILE IRR 1000ML POUR (IV SOLUTION) ×4 IMPLANT
WIRE AMPLATZ SS-J .035X180CM (WIRE) IMPLANT
WIRE BENTSON .035X145CM (WIRE) ×4 IMPLANT
WIRE STARTER BENTSON 035X150 (WIRE) ×1 IMPLANT

## 2020-04-15 NOTE — Anesthesia Preprocedure Evaluation (Addendum)
Anesthesia Evaluation  Patient identified by MRN, date of birth, ID band Patient awake    Reviewed: Allergy & Precautions, NPO status , Patient's Chart, lab work & pertinent test results  History of Anesthesia Complications Negative for: history of anesthetic complications  Airway Mallampati: III  TM Distance: >3 FB Neck ROM: Full    Dental no notable dental hx. (+) Dental Advisory Given   Pulmonary neg pulmonary ROS, former smoker,    Pulmonary exam normal        Cardiovascular + CAD  Normal cardiovascular exam  Narrative  Prox RCA lesion is 65% stenosed.  LV end diastolic pressure is mildly elevated.  1. Nonobstructive CAD. The mid RCA has an eccentric stenosis. In some view  it only appears 40% narrowed but in other views it appears to be 70%. DFR  is normal indicating it is not flow limiting.  2. Mildly elevated LVEDP 18 mm Hg  Plan: medical management.    Neuro/Psych negative neurological ROS     GI/Hepatic Neg liver ROS, GERD  ,  Endo/Other  negative endocrine ROS  Renal/GU Renal InsufficiencyRenal disease     Musculoskeletal negative musculoskeletal ROS (+)   Abdominal   Peds  Hematology negative hematology ROS (+)   Anesthesia Other Findings   Reproductive/Obstetrics                            Anesthesia Physical Anesthesia Plan  ASA: III  Anesthesia Plan: General   Post-op Pain Management:    Induction: Intravenous  PONV Risk Score and Plan: 3 and Ondansetron, Dexamethasone and Diphenhydramine  Airway Management Planned: Oral ETT  Additional Equipment: Arterial line  Intra-op Plan:   Post-operative Plan: Extubation in OR  Informed Consent: I have reviewed the patients History and Physical, chart, labs and discussed the procedure including the risks, benefits and alternatives for the proposed anesthesia with the patient or authorized representative who has  indicated his/her understanding and acceptance.     Dental advisory given  Plan Discussed with: CRNA and Anesthesiologist  Anesthesia Plan Comments:        Anesthesia Quick Evaluation

## 2020-04-15 NOTE — Interval H&P Note (Signed)
History and Physical Interval Note:  04/15/2020 3:05 PM  Ronald Cameron  has presented today for surgery, with the diagnosis of CHEST PAIN.  The various methods of treatment have been discussed with the patient and family. After consideration of risks, benefits and other options for treatment, the patient has consented to  Procedure(s): TRANSCAROTID ARTERY REVASCULARIZATION (Left) as a surgical intervention.  The patient's history has been reviewed, patient examined, no change in status, stable for surgery.  I have reviewed the patient's chart and labs.  Questions were answered to the patient's satisfaction.     Ruta Hinds

## 2020-04-15 NOTE — Transfer of Care (Signed)
Immediate Anesthesia Transfer of Care Note  Patient: Ronald Cameron  Procedure(s) Performed: TRANSCAROTID ARTERY REVASCULARIZATION (Left ) ULTRASOUND GUIDANCE FOR VASCULAR ACCESS (Right Groin) INSERTION OF ENDOVASCULAR STENT INTO THE LEFT INTERNAL COMMON CAROTID ARTERY (Left Neck)  Patient Location: PACU  Anesthesia Type:General  Level of Consciousness: awake, alert  and oriented  Airway & Oxygen Therapy: Patient Spontanous Breathing and Patient connected to nasal cannula oxygen  Post-op Assessment: Report given to RN, Post -op Vital signs reviewed and stable, Patient moving all extremities and Patient able to stick tongue midline  Post vital signs: Reviewed and stable  Last Vitals:  Vitals Value Taken Time  BP 102/60 04/15/20 1645  Temp    Pulse 70 04/15/20 1652  Resp 17 04/15/20 1652  SpO2 93 % 04/15/20 1652  Vitals shown include unvalidated device data.  Last Pain:  Vitals:   04/15/20 0800  TempSrc:   PainSc: 0-No pain      Patients Stated Pain Goal: 0 (07/12/50 0258)  Complications: No complications documented.

## 2020-04-15 NOTE — Anesthesia Postprocedure Evaluation (Signed)
Anesthesia Post Note  Patient: Ronald Cameron  Procedure(s) Performed: TRANSCAROTID ARTERY REVASCULARIZATION (Left ) ULTRASOUND GUIDANCE FOR VASCULAR ACCESS (Right Groin) INSERTION OF ENDOVASCULAR STENT INTO THE LEFT INTERNAL COMMON CAROTID ARTERY (Left Neck)     Patient location during evaluation: PACU Anesthesia Type: General Level of consciousness: awake and alert Pain management: pain level controlled Vital Signs Assessment: post-procedure vital signs reviewed and stable Respiratory status: spontaneous breathing, nonlabored ventilation, respiratory function stable and patient connected to nasal cannula oxygen Cardiovascular status: blood pressure returned to baseline and stable Postop Assessment: no apparent nausea or vomiting Anesthetic complications: no   No complications documented.  Last Vitals:  Vitals:   04/15/20 1815 04/15/20 1837  BP: (!) 101/57 104/62  Pulse: 63   Resp: 14   Temp:    SpO2: 97% 98%    Last Pain:  Vitals:   04/15/20 1837  TempSrc: Oral  PainSc:                  Maura Braaten DAVID

## 2020-04-15 NOTE — Progress Notes (Signed)
STROKE TEAM PROGRESS NOTE   INTERVAL HISTORY Patient is lying in bed.  Is comfortable.  He has no complaints.  He had transient dizziness yesterday when he got out but is doing fine.  His wife is at the bedside.  Is scheduled for carotid angioplasty stenting later   today  OBJECTIVE Vitals:   04/14/20 1538 04/14/20 2005 04/15/20 0545 04/15/20 1352  BP: 117/74 111/68 99/64   Pulse: (!) 58 64 (!) 57   Resp: 16 17 18    Temp: 97.9 F (36.6 C) 98.1 F (36.7 C) 98.3 F (36.8 C)   TempSrc: Oral Oral Oral   SpO2: 97% 98% 97%   Weight:   99.4 kg 99.4 kg  Height:    5\' 11"  (1.803 m)    CBC:  Recent Labs  Lab 04/11/20 0716 04/15/20 0623  WBC 7.7 9.5  HGB 14.7 14.9  HCT 45.0 45.3  MCV 98.7 96.8  PLT 392 423*    Basic Metabolic Panel:  Recent Labs  Lab 04/10/20 0436 04/10/20 0436 04/11/20 0716 04/15/20 0623  NA 135   < > 138 139  K 3.8   < > 4.0 3.8  CL 110   < > 110 109  CO2 17*   < > 19* 21*  GLUCOSE 108*   < > 92 78  BUN 17   < > 17 17  CREATININE 0.99   < > 1.13 1.23  CALCIUM 8.1*   < > 8.1* 8.5*  MG 1.9  --   --   --    < > = values in this interval not displayed.    Lipid Panel:     Component Value Date/Time   CHOL 235 (H) 04/10/2020 0436   TRIG 96 04/10/2020 0436   HDL 32 (L) 04/10/2020 0436   CHOLHDL 7.3 04/10/2020 0436   VLDL 19 04/10/2020 0436   LDLCALC 184 (H) 04/10/2020 0436   HgbA1c:  Lab Results  Component Value Date   HGBA1C 5.7 (H) 04/11/2020   Urine Drug Screen: No results found for: LABOPIA, COCAINSCRNUR, LABBENZ, AMPHETMU, THCU, LABBARB  Alcohol Level No results found for: ETH  IMAGING past 24h HYBRID OR IMAGING ([place])  Result Date: 04/15/2020 There is no interpretation for this exam.  This order is for images obtained during a surgical procedure.  Please See "Surgeries" Tab for more information regarding the procedure.     PHYSICAL EXAM    Blood pressure 99/64, pulse (!) 57, temperature 98.3 F (36.8 C), temperature source Oral,  resp. rate 18, height 5\' 11"  (1.803 m), weight 99.4 kg, SpO2 97 %. Pleasant 64 middle-age mildly obese Caucasian male not in distress. . Afebrile. Head is nontraumatic. Neck is supple without bruit.    Cardiac exam no murmur or gallop. Lungs are clear to auscultation. Distal pulses are well felt. Neurological Exam ;  Awake  Alert oriented x 3. Normal speech and language.eye movements full without nystagmus.fundi were not visualized. Vision acuity and fields appear normal. Hearing is normal. Palatal movements are normal. Face symmetric. Tongue midline. Normal strength, tone, reflexes and coordination. Normal sensation. Gait deferred.   ASSESSMENT/PLAN Ronald Cameron is a 64 y.o. male with history of rheumatoid arthritis and osteoarthritis who presented to ED on 04/09/20 for evaluation of chest pain, palpitations and positional dizziness -> cardiac cath 7/16 ->  nonobstructive CAD -> CTA head and neck that showed a near occlusive left internal carotid stenosis -> scheduled for outpatient left internal carotid stent on 04/21/2020 with  Dr. Oneida Alar. MRI obtained for ongoing dizziness and intermittently blurred vision -> two punctate high right frontal areas of acute infarction. He did not receive IV t-PA as it was felt that strokes on MRI were likely incidental.  Stroke: Two punctate right frontal acute infarcts - likely subcortical in setting of uncontrollled risk factors, lacunar  source  MRI head - Two punctate right frontal acute infarcts  CTA Neck - Severe near occlusive atheromatous stenosis involving the proximal left ICA.  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  Carotid Doppler - Left Carotid:  80-99% stenosis. ECA >50% stenosis  2D Echo - EF 55 to 60%. No cardiac source of emboli identified.   Sars Corona Virus 2 - negative  LDL - 184  HgbA1c - 5.7  VTE prophylaxis - none  aspirin 81 mg daily prior to admission, now on aspirin 81 mg  daily and clopidogrel 75 mg daily  Therapy recommendations:  OP PT, no OT  Disposition:  Pending  Hemodynamic TIAs in setting of symptomatic L ICA stenosis  Carotid Doppler - Left Carotid: 80-99% stenosis.   For TCAR today at 2p ([place])  Hyperlipidemia  Home Lipid lowering medication: none  LDL 184, goal < 70  Current lipid lowering medication: Lipitor 80 mg daily   Continue statin at discharge  Other Stroke Risk Factors  Former cigarette smoker - quit  Obesity, Body mass index is 30.56 kg/m., recommend weight loss, diet and exercise as appropriate  Nonobstructive CAD  Other Active Problems  RA - prednisone ; methotrexate ; tramadol ; Xeljanz  Bradycardia  Hospital day # 5  Continue aspirin and Brilinta and carotid angioplasty/stenting later today.  Discussion with patient and wife and answered questions  Antony Contras, MD Medical Director [facility] Pager: [phone] 04/15/2020 4:28 PM To contact Stroke Continuity provider, please refer to [email or web address] After hours, contact General Neurology

## 2020-04-15 NOTE — Progress Notes (Signed)
PROGRESS NOTE    Ronald Cameron  OIZ:[id number] DOB: 17-Aug-1956 DOA: 04/09/2020 PCP: Claretta Fraise, MD   Brief Narrative:   Patient is a 64 year old male with history of rheumatoid arthritis, chronic pain syndrome, diverticulitis who presents to the emergency department with complaints of palpitations, chest discomfort.  On presentation, EKG showed inferolateral T wave inversions.  CT head was negative.  Acute intracranial abnormalities.  CT chest/abdomen/pelvis was negative for aortic dissection or aneurysm.  CTA neck was concerning of severe near occlusive atheromatous stenosis involving the left ICA.  Cardiology and vascular surgery consulted.  He underwent cardiac catheter with finding of nonobstructive  coronary disease.  Venous duplex of the carotid arteries confirmed 80 to 99% stenosis of the left ICA.  Vascular surgery initially  planning for stent placement as an outpatient.  He was planned for discharge on 04/11/20  but he continue to remain dizzy.  MRI of the brain showed 2 punctate right frontal acute infarcts.  Neurology consulted.  Due to suspicion of ongoing TIA, vascular surgery again notified.Plan for carotoid stenting on Wednesday.  7/21: Carotid stent placement today. Denies complaints. Hopefully home tomorrow if stable after stent.    Assessment & Plan:   Principal Problem:   Chest pain Active Problems:   Rheumatoid arthritis (HCC)   Carotid stenosis   Hypokalemia  Dizziness     - Complained of  intermittent dizziness with left blurry vision while ambulating.      - Continue meclizine for symptomatic relief for now.     - denies complaints today  Acute nonhemorrhagic stroke     - MRI of the brain showed 2 punctuate right frontal acute infarcts.       - This is most likely associated with post cath complication and unlikely the cause of dizziness.      - Neurology following.       - PT/OT/speech evaluation done: OP PT, no OT      - Hemoglobin A1c of 5.7. LDL  of 184.       - Continue Lipitor, aspirin, Plavix.     - Carotid stenosis noted, see below  Severe carotid stenosis     - CTA neck and venous duplex of the carotid artery confirmed severe stenosis of the left ICA.      - Vascular surgery was consulted.       - Continue aspirin and Plavix.     - Due to ongoing neurological symptom, vascular surgery placed stent today; appreciate assistance  Chest pain/palpitations     - Presented with 1 week history of intermittent palpitations, chest discomfort.       - EKG showed T wave inversions.       - Troponins were normal.       - No acute pathology as per CT chest abdomen/pelvis.       - Cardiology was consulted and he underwent cardiac cath which showed proximal RCA lesion with 60% stenosis but nonobstructive.       - Echo showed ejection fraction of 55 to 60%, mild left ventricular hypertrophy, grade 1 diastolic dysfunction, no regional wall motion abnormalities.      - Cardiology has signed off; appreciate assistance  Hypokalemia     - replaced, follow  Rheumatoid arthritis      - Stable. Follow-up outpatient.       Lisette Grinder, prednisone and methotrexate  Hyperlipidemia     - LDL of 184.     - continue lipitor  DVT prophylaxis: heparin Code Status: FULL Family Communication: With family at bedside   Status is: Inpatient  Remains inpatient appropriate because:Ongoing diagnostic testing needed not appropriate for outpatient work up   Dispo: The patient is from: Home              Anticipated d/c is to: Home              Anticipated d/c date is: 1 day              Patient currently is not medically stable to d/c.  Consultants:   Cardiology  Neurology  Vascular Surgery  Procedures:   LHC  Carotid artery stent placement  ROS:  Denies CP, N, V, ab pain . Remainder ROS is negative for all not previously mentioned.  Subjective: "They told me it would be this afternoon."  Objective: Vitals:   04/15/20 1745  04/15/20 1800 04/15/20 1815 04/15/20 1837  BP: (!) 99/58 97/61 (!) 101/57 104/62  Pulse: 65 64 63   Resp: 12 13 14    Temp: 97.7 F (36.5 C)     TempSrc:    Oral  SpO2: 98% 97% 97% 98%  Weight:      Height:        Intake/Output Summary (Last 24 hours) at 04/15/2020 1853 Last data filed at 04/15/2020 1634 Gross per 24 hour  Intake 1600 ml  Output 50 ml  Net 1550 ml   Filed Weights   04/14/20 0543 04/15/20 0545 04/15/20 1352  Weight: 99.9 kg 99.4 kg 99.4 kg    Examination:  General: 64 y.o. male resting in bed in NAD Cardiovascular: RRR, +S1, S2, no m/g/r, equal pulses throughout Respiratory: CTABL, no w/r/r, normal WOB GI: BS+, NDNT, no masses noted, no organomegaly noted MSK: No e/c/c Neuro: A&O x 3, no focal deficits Psyc: Appropriate interaction and affect, calm/cooperative   Data Reviewed: I have personally reviewed following labs and imaging studies.  CBC: Recent Labs  Lab 04/09/20 1536 04/10/20 0436 04/11/20 0716 04/15/20 0623 04/15/20 1650  WBC 8.8 8.2 7.7 9.5 11.8*  HGB 14.6 13.9 14.7 14.9 14.0  HCT 44.2 43.3 45.0 45.3 43.1  MCV 97.1 100.0 98.7 96.8 98.2  PLT 467* 436* 392 423* 093*   Basic Metabolic Panel: Recent Labs  Lab 04/09/20 1536 04/10/20 0436 04/11/20 0716 04/15/20 0623 04/15/20 1650  NA 138 135 138 139  --   K 3.3* 3.8 4.0 3.8  --   CL 107 110 110 109  --   CO2 21* 17* 19* 21*  --   GLUCOSE 134* 108* 92 78  --   BUN 18 17 17 17   --   CREATININE 1.20 0.99 1.13 1.23 1.14  CALCIUM 8.5* 8.1* 8.1* 8.5*  --   MG  --  1.9  --   --   --    GFR: Estimated Creatinine Clearance: 81.7 mL/min (by C-G formula based on SCr of 1.14 mg/dL). Liver Function Tests: No results for input(s): AST, ALT, ALKPHOS, BILITOT, PROT, ALBUMIN in the last 168 hours. No results for input(s): LIPASE, AMYLASE in the last 168 hours. No results for input(s): AMMONIA in the last 168 hours. Coagulation Profile: No results for input(s): INR, PROTIME in the last 168  hours. Cardiac Enzymes: Recent Labs  Lab 04/10/20 0436  CKTOTAL 42*   BNP (last 3 results) No results for input(s): PROBNP in the last 8760 hours. HbA1C: No results for input(s): HGBA1C in the last 72 hours. CBG: No results for  input(s): GLUCAP in the last 168 hours. Lipid Profile: No results for input(s): CHOL, HDL, LDLCALC, TRIG, CHOLHDL, LDLDIRECT in the last 72 hours. Thyroid Function Tests: No results for input(s): TSH, T4TOTAL, FREET4, T3FREE, THYROIDAB in the last 72 hours. Anemia Panel: No results for input(s): VITAMINB12, FOLATE, FERRITIN, TIBC, IRON, RETICCTPCT in the last 72 hours. Sepsis Labs: No results for input(s): PROCALCITON, LATICACIDVEN in the last 168 hours.  Recent Results (from the past 240 hour(s))  SARS Coronavirus 2 by RT PCR (hospital order, performed in [facility] hospital lab) Nasopharyngeal Nasopharyngeal Swab     Status: None   Collection Time: 04/09/20 11:39 PM   Specimen: Nasopharyngeal Swab  Result Value Ref Range Status   SARS Coronavirus 2 NEGATIVE NEGATIVE Final    Comment: (NOTE) SARS-CoV-2 target nucleic acids are NOT DETECTED.  The SARS-CoV-2 RNA is generally detectable in upper and lower respiratory specimens during the acute phase of infection. The lowest concentration of SARS-CoV-2 viral copies this assay can detect is 250 copies / mL. A negative result does not preclude SARS-CoV-2 infection and should not be used as the sole basis for treatment or other patient management decisions.  A negative result may occur with improper specimen collection / handling, submission of specimen other than nasopharyngeal swab, presence of viral mutation(s) within the areas targeted by this assay, and inadequate number of viral copies (<250 copies / mL). A negative result must be combined with clinical observations, patient history, and epidemiological information.  Fact Sheet for Patients:   StrictlyIdeas.no  Fact Sheet  for Healthcare Providers: BankingDealers.co.za  This test is not yet approved or  cleared by the [place] FDA and has been authorized for detection and/or diagnosis of SARS-CoV-2 by FDA under an Emergency Use Authorization (EUA).  This EUA will remain in effect (meaning this test can be used) for the duration of the COVID-19 declaration under Section 564(b)(1) of the Act, 21 U.S.C. section 360bbb-3(b)(1), unless the authorization is terminated or revoked sooner.  Performed at [facility], [place] [address] [place], [place] [address]   Surgical pcr screen     Status: None   Collection Time: 04/15/20 11:25 AM   Specimen: Nasal Mucosa; Nasal Swab  Result Value Ref Range Status   MRSA, PCR NEGATIVE NEGATIVE Final   Staphylococcus aureus NEGATIVE NEGATIVE Final    Comment: (NOTE) The Xpert SA Assay (FDA approved for NASAL specimens in patients 60 years of age and older), is one component of a comprehensive surveillance program. It is not intended to diagnose infection nor to guide or monitor treatment. Performed at [facility], [place] [address] [place],  [address]       Radiology Studies: Structural Heart Procedure  Result Date: 04/15/2020 See surgical note for result.  HYBRID OR IMAGING ([facility] ONLY)  Result Date: 04/15/2020 There is no interpretation for this exam.  This order is for images obtained during a surgical procedure.  Please See "Surgeries" Tab for more information regarding the procedure.     Scheduled Meds: . aspirin  81 mg Oral Daily  . atorvastatin  80 mg Oral Daily  . clopidogrel  75 mg Oral Daily  . [START ON 04/16/2020] docusate sodium  100 mg Oral Daily  . folic acid  1 mg Oral Daily  . [START ON 04/16/2020] heparin  5,000 Units Subcutaneous Q8H  . oxyCODONE-acetaminophen      . pantoprazole  40 mg Oral Daily  . predniSONE  10 mg Oral Daily  .  sodium chloride flush  3 mL Intravenous Q12H  . sodium chloride  flush  3 mL Intravenous Q12H  . Tofacitinib Citrate ER  11 mg Oral Daily   Continuous Infusions: . sodium chloride    . sodium chloride    .  ceFAZolin (ANCEF) IV    . lactated ringers 10 mL/hr at 04/15/20 1404  . magnesium sulfate bolus IVPB       LOS: 5 days    Time spent: 25 minutes spent in the coordination of care today.    Jonnie Finner, DO [facility]  If 7PM-7AM, please contact night-coverage [email or web address] 04/15/2020, 6:53 PM

## 2020-04-15 NOTE — Progress Notes (Signed)
Called by RN that patient with possible blood pressure decreased to 83/45.  Patient was given 500 ml normal saline bolus without improvement. Patient has no complaints of chest pain, palpitations or shortness of breath per RN Given 1000 mL LR bolus Monitor blood pressure

## 2020-04-15 NOTE — Discharge Instructions (Signed)
[facility]  Discharge Instructions   Transcarotid artery revascularization (stenting)   Please refer to the following instructions for your post-procedure care. Your surgeon or physician assistant will discuss any changes with you.  Activity  You are encouraged to walk as much as you can. You can slowly return to normal activities but must avoid strenuous activity and heavy lifting until your doctor tell you it's okay. Avoid activities such as vacuuming or swinging a golf club. You can drive after one week if you are comfortable and you are no longer taking prescription pain medications. It is normal to feel tired for serval weeks after your surgery. It is also normal to have difficulty with sleep habits, eating, and bowel movements after surgery. These will go away with time.  Bathing/Showering  Shower daily after you go home. Do not soak in a bathtub, hot tub, or swim until the incision heals completely.  Incision Care  Shower every day. Clean your incision with mild soap and water. Pat the area dry with a clean towel. You do not need a bandage unless otherwise instructed. Do not apply any ointments or creams to your incision. You may have skin glue on your incision. Do not peel it off. It will come off on its own in about one week. Your incision may feel thickened and raised for several weeks after your surgery. This is normal and the skin will soften over time.   For Men Only: It's okay to shave around the incision but do not shave the incision itself for 2 weeks. It is common to have numbness under your chin that could last for several months.  Diet  Resume your normal diet. There are no special food restrictions following this procedure. A low fat/low cholesterol diet is recommended for all patients with vascular disease. In order to heal from your surgery, it is CRITICAL to get adequate nutrition. Your body requires vitamins, minerals, and protein.  Vegetables are the best source of vitamins and minerals. Vegetables also provide the perfect balance of protein. Processed food has little nutritional value, so try to avoid this.  Medications  Resume taking all of your medications unless your doctor or physician assistant tells you not to. If your incision is causing pain, you may take over-the- counter pain relievers such as acetaminophen (Tylenol). If you were prescribed a stronger pain medication, please be aware these medications can cause nausea and constipation. Prevent nausea by taking the medication with a snack or meal. Avoid constipation by drinking plenty of fluids and eating foods with a high amount of fiber, such as fruits, vegetables, and grains.   Do not take Tylenol if you are taking prescription pain medications.  Follow Up  Our office will schedule a follow up appointment 4 weeks following discharge.  Please call us immediately for any of the following conditions  . Increased pain, redness, drainage (pus) from your incision site. . Fever of 101 degrees or higher. . If you should develop stroke (slurred speech, difficulty swallowing, weakness on one side of your body, loss of vision) you should call 911 and go to the nearest emergency room. .  Reduce your risk of vascular disease:  . Stop smoking. If you would like help call [facility] at 1-800-QUIT-NOW 631-344-5206) or [place] at 463-567-8597. . Manage your cholesterol . Maintain a desired weight . Control your diabetes . Keep your blood pressure down .  If you have any questions, please call the office at  [phone]. 

## 2020-04-15 NOTE — Anesthesia Procedure Notes (Signed)
Procedure Name: Intubation Date/Time: 04/15/2020 2:55 PM Performed by: Amellia Panik T, CRNA Pre-anesthesia Checklist: Patient identified, Emergency Drugs available, Suction available and Patient being monitored Patient Re-evaluated:Patient Re-evaluated prior to induction Oxygen Delivery Method: Circle system utilized Preoxygenation: Pre-oxygenation with 100% oxygen Induction Type: IV induction Ventilation: Mask ventilation without difficulty Laryngoscope Size: Mac and 3 Grade View: Grade III Tube type: Oral Tube size: 8.0 mm Number of attempts: 1 Airway Equipment and Method: Stylet and Oral airway Placement Confirmation: ETT inserted through vocal cords under direct vision,  positive ETCO2 and breath sounds checked- equal and bilateral Secured at: 22 cm Tube secured with: Tape Dental Injury: Teeth and Oropharynx as per pre-operative assessment  Difficulty Due To: Difficulty was anticipated, Difficult Airway- due to anterior larynx, Difficult Airway- due to limited oral opening and Difficult Airway- due to dentition

## 2020-04-15 NOTE — Op Note (Signed)
Procedure: Trans carotid artery revascularization (TCAR) Left Side, Ultrasound groin  Preoperative diagnosis: Symptomatic left internal carotid artery stenosis  Postoperative diagnosis: Same  Anesthesia: Gen.  Assistant: Fortunato Curling M.D. who provided assistance in exposure cannulation of the venous system and expediting of the procedure.  Indications: Patient is a 64 year old male who recently had a left side amaurosis event.  He has a high carotid lesion anatomically  Operative findings: #1 90% left internal carotid artery stenosis stented to residual less than 10% stenosis (10 x 40 mm ENROUTE stent)  #2 right femoral venous access  Operative details: After obtaining informed consent, the patient was taken the operating room. The patient was placed in supine position upper table. After induction of general anesthesia and endotracheal intubation.  At this point the entire right neck and chest were prepped and draped in usual sterile fashion. The right groin was also prepped and draped in usual sterile fashion. A time out was performed. Next a oblique incision was made between the heads of the sternocleidomastoid muscle on the left side of the neck. Incision was carried down through the platysma to the level of the left internal jugular vein. This was reflected laterally. The vagus nerve was identified and protected. Common carotid artery was dissected free circumferentially at the base of the incision. This was elevated up in the operative field with a vessel loop. Approximately 3-4 cm of the artery was dissected free circumferentially to allow adequate exposure. A pursestring suture was placed in the artery at the area we were planning to puncture using a running 6-0 Prolene suture.At this point femoral venous access was established via the right groin. Ultrasound was used to identify the right common femoral vein. A micropuncture needle was used to cannulate the right common femoral vein and a  micropuncture wire advanced into the vein and the micropuncture sheath placed over this. An 49 Bentsen wire was advanced up into the right femoral venous system. The sheath for the flow reversal system was placed into the right femoral system and thoroughly flushed with heparinized saline. The patient was given 10,000 units of total heparin to establish an ACT greater than 250. At this point a micropuncture needle was used to cannulate the left common carotid artery. The micropuncture wire was advanced just below the level of the carotid bifurcation. The micropuncture sheath was then advanced over this about 2 cm into the common carotid artery. This was thoroughly flushed with heparinized saline.a contrast angiogram was then performed in AP projection to confirm that we were truly intraluminal with the sheath. This was also used to determine the level of the carotid bifurcation. The carotid Amplatz wire was then placed through the micropuncture sheath and the sheath removed. The 8 French flow reversal arterial sheath was then advanced over the guidewire and into the common carotid artery. Sheath was sutured to the skin with 3 2-0 silk sutures.Contrast angiogram was again performed to make sure that we were within the true lumen. This was done in 2 views.  The filter device was switched to the low-flow selection. The flow reversal system was then hooked up to the arterial end of the sheath after everything had been thoroughly flushed and de-aired. Passive flow was used to fill the arterial and of the filter and through the filter and up to the level of the venous sheath. The venous sheath was also fully de-aired and passive flow was established. This was checked by saline infusion in the venous port and noted to flush  easily. High flow was then turned on on the filter device. At this point a TCAR timeout was performed to make sure that the patient's blood pressure was reasonable. It was systolic 993 at this point.  We again confirmed that the ACT was above 250. The balloon and guidewire insufflator and stent were all selected. These were all prepped. A 5 x 30 mm angioplasty balloon had been selected based on the preoperative CT. Based on the angiogram intraoperatively as well as preoperative CT and 10 x 40 mm Enroute stent was selected. The 014 wire was slightly shaped opening and due to slight curvature of the internal carotid artery. This was advanced with the balloon over it into the common carotid artery and a guidewire used to selectively catheterize the right internal carotid artery. The lesion was 90% narrowed.We confirmed that we were within the internal carotid artery by first establishing that the guidewire advanced into the petrous portion of the internal carotid artery and also with contrast angiogram. At this point the 5 x 30 balloon was centered on the lesion and inflated to 8 atm slowly inflating and deflating the balloon. We then brought up the 10 x 40 Enroute stent and advanced and centered this on the lesion.  This was then deployed using a pinch technique after opening the Tuohy Borst valve.  We then gave the patient 4 minutes of additional flow reversal before performing a completion angiogram other than the usual 2 minutes due to a large amount of thrombus within the carotid.  Completion angiogram showed less than 10% waist.  No post dilation was performed.. An LAO angiogram were performed to confirm that the stent was fully deployed after post dilation. At this point the guidewire was removed. The flow reversal system was clamped off at the arterial sheath and disconnected. The remaining blood was returned to the patient. This was then disconnected from the venous sheath. The arterial sheath was removed and the pursestring suture cinched down. The patient was given 100 mg of protamine to achieve hemostasis. The venous sheath was pulled and hemostasis obtained with direct pressure. The carotid was  inspected found to be without any area of hematoma or active bleeding. Pt was given 100 mg of protamine. The platysma muscle was reapproximated using a running 3-0 Vicryl suture. The skin was closed with a 4-0 Vicryl subcuticular stitch.  The venous sheath was removed and hemostasis obtained with direct pressure.  Dermabond was applied to the incision. Patient was awakened in the operating room and moving her upper and lower extremities symmetrically. He was taken to the recovery room in stable condition.  Ruta Hinds, MD [facility] [place] Office: [phone] Pager: [phone]

## 2020-04-15 NOTE — Care Management Important Message (Signed)
Important Message  Patient Details  Name: Ronald Cameron MRN: [id number] Date of Birth: Jan 17, 1956   Medicare Important Message Given:  Yes     Shelda Altes 04/15/2020, 11:41 AM

## 2020-04-15 NOTE — Progress Notes (Signed)
Patient awake in PACU moving upper extremity lower extremity symmetrically tongue midline no hoarseness no hematoma  Awaiting transfer to stepdown  Stable post [place], MD [facility] [place] Office: [phone]

## 2020-04-15 NOTE — Anesthesia Procedure Notes (Signed)
Arterial Line Insertion Start/End7/21/2021 2:00 PM, 04/15/2020 2:07 PM Performed by: Flowers, Rokoshi T, Immunologist, CRNA  Patient location: Pre-op. Preanesthetic checklist: patient identified, IV checked, site marked, risks and benefits discussed, surgical consent, monitors and equipment checked, pre-op evaluation, timeout performed and anesthesia consent Lidocaine 1% used for infiltration Right, radial was placed Catheter size: 20 G Hand hygiene performed , maximum sterile barriers used  and Seldinger technique used Allen's test indicative of satisfactory collateral circulation Attempts: 1 Procedure performed without using ultrasound guided technique. Following insertion, Biopatch and dressing applied. Post procedure assessment: normal  Patient tolerated the procedure well with no immediate complications. Additional procedure comments: A-line performed by Champ Mungo, SRNA.

## 2020-04-16 ENCOUNTER — Encounter (HOSPITAL_COMMUNITY): Payer: Self-pay | Admitting: Vascular Surgery

## 2020-04-16 DIAGNOSIS — E785 Hyperlipidemia, unspecified: Secondary | ICD-10-CM

## 2020-04-16 DIAGNOSIS — I639 Cerebral infarction, unspecified: Secondary | ICD-10-CM

## 2020-04-16 LAB — MAGNESIUM: Magnesium: 1.7 mg/dL (ref 1.7–2.4)

## 2020-04-16 LAB — CBC
HCT: 41 % (ref 39.0–52.0)
Hemoglobin: 13.7 g/dL (ref 13.0–17.0)
MCH: 32.7 pg (ref 26.0–34.0)
MCHC: 33.4 g/dL (ref 30.0–36.0)
MCV: 97.9 fL (ref 80.0–100.0)
Platelets: 381 10*3/uL (ref 150–400)
RBC: 4.19 MIL/uL — ABNORMAL LOW (ref 4.22–5.81)
RDW: 14.6 % (ref 11.5–15.5)
WBC: 9.6 10*3/uL (ref 4.0–10.5)
nRBC: 0 % (ref 0.0–0.2)

## 2020-04-16 LAB — BASIC METABOLIC PANEL
Anion gap: 7 (ref 5–15)
BUN: 12 mg/dL (ref 8–23)
CO2: 20 mmol/L — ABNORMAL LOW (ref 22–32)
Calcium: 8 mg/dL — ABNORMAL LOW (ref 8.9–10.3)
Chloride: 111 mmol/L (ref 98–111)
Creatinine, Ser: 0.99 mg/dL (ref 0.61–1.24)
GFR calc Af Amer: >60 mL/min (ref >60)
GFR calc non Af Amer: >60 mL/min (ref >60)
Glucose, Bld: 86 mg/dL (ref 70–99)
Potassium: 3.6 mmol/L (ref 3.5–5.1)
Sodium: 138 mmol/L (ref 135–145)

## 2020-04-16 MED ORDER — SODIUM CHLORIDE 0.9 % IV BOLUS
1000.0000 mL | Freq: Once | INTRAVENOUS | Status: AC
  Administered 2020-04-16: 1000 mL via INTRAVENOUS

## 2020-04-16 MED ORDER — DOPAMINE-DEXTROSE 3.2-5 MG/ML-% IV SOLN
5.0000 ug/kg/min | INTRAVENOUS | Status: DC
  Administered 2020-04-16: 4 ug/kg/min via INTRAVENOUS
  Administered 2020-04-16: 5 ug/kg/min via INTRAVENOUS
  Administered 2020-04-17: 2 ug/kg/min via INTRAVENOUS
  Filled 2020-04-16 (×2): qty 250

## 2020-04-16 NOTE — Significant Event (Signed)
Rapid Response Event Note  Overview: Time Called: 1626 Arrival Time: 1630 Event Type: Cardiac, Hypotension  Initial Focused Assessment: Patient with very labile BP.  Dopamine at 57mcg/kg/min BP 88/58  HR 56  Then 10 minutes later without adjusting dopamine BP 175/65  HR 58.  He complains of head ache when BP elevated. BP every 10 min:  155/68, 143/59, 139/59, 108/58  He is asymptotic with  These BP readings  Occasional JR,  Mostly SR 40-50s.  He is fully alert and oriented, easily conversant.  Lying in the bed.  He denies any nausea or bearing down.    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   Event Summary:   at      at       Event End Time: 1815  Raliegh Ip

## 2020-04-16 NOTE — Progress Notes (Signed)
Pt's blood pressures have been soft tonight.  Ranging from 80/50 to 90/50.  Gave the pt the PRN order of the IV normal saline 500 cc bolus twice.  With no change the on call hospitalist was paged, and ordered an IV  1 L Lactated Ringers bolus.  The Lactated Ringers did not improve his BP as well.  Nurse notified Dr. Oneida Alar, who ordered IV Dopamine 5 mcg/kg/min and to give a one time IV normal saline 1000 mL bolus.  Will continue to monitor.  Lupita Dawn, RN

## 2020-04-16 NOTE — Progress Notes (Signed)
A line removed per order. Pt tolerated well. VSS. Will continue to monitor.  Arletta Bale, RN

## 2020-04-16 NOTE — Progress Notes (Addendum)
  Progress Note    04/16/2020 7:31 AM 1 Day Post-Op  Subjective:  Very tired. Complaining mostly of left neck soreness and throat soreness   Vitals:   04/16/20 0641 04/16/20 0651  BP: (!) 96/57 (!) 113/59  Pulse: (!) 50 (!) 50  Resp: 12 13  Temp:    SpO2: 93% 93%   Physical Exam: Cardiac:  regular Lungs:  Non labored Incisions:  Left neck incision clean, dry and intact. Tender to palpation. Mild swelling. Soft without hematoma Extremities: right femoral vein access site clean, dry and intact without swelling or hematoma.  Moving all extremities without deficits. 2+ palpable radial, femoral, DP pulses bilaterally. Feet and hands warm and well perfused Abdomen:  Soft, non tender non distended Neurologic: CN intact. Alert and oriented. Tongue midline, smile symmetric. Speech clear and coherent  CBC    Component Value Date/Time   WBC 9.6 04/16/2020 0412   RBC 4.19 (L) 04/16/2020 0412   HGB 13.7 04/16/2020 0412   HCT 41.0 04/16/2020 0412   PLT 381 04/16/2020 0412   MCV 97.9 04/16/2020 0412   MCH 32.7 04/16/2020 0412   MCHC 33.4 04/16/2020 0412   RDW 14.6 04/16/2020 0412   LYMPHSABS 1.1 09/02/2017 0546   MONOABS 1.0 09/02/2017 0546   EOSABS 0.1 09/02/2017 0546   BASOSABS 0.0 09/02/2017 0546    BMET    Component Value Date/Time   NA 138 04/16/2020 0412   K 3.6 04/16/2020 0412   CL 111 04/16/2020 0412   CO2 20 (L) 04/16/2020 0412   GLUCOSE 86 04/16/2020 0412   BUN 12 04/16/2020 0412   CREATININE 0.99 04/16/2020 0412   CALCIUM 8.0 (L) 04/16/2020 0412   GFRNONAA >60 04/16/2020 0412   GFRAA >60 04/16/2020 0412    INR No results found for: INR   Intake/Output Summary (Last 24 hours) at 04/16/2020 0731 Last data filed at 04/16/2020 0511 Gross per 24 hour  Intake 4199.5 ml  Output 2575 ml  Net 1624.5 ml     Assessment/Plan:  64 y.o. male is s/p left TCAR 1 Day Post-Op. Doing well post op. Neurologically intact. Blood pressures have been labile. On Dopamine gtt  at 3 mcg currently. Otherwise hemodynamically stable. Incision clean, dry and intact. Right femoral vein access site clean, dry and intact. Okay to Mobilize today  DVT prophylaxis:  Heparin gtt   Karoline Caldwell, PA-C [facility] [phone] 04/16/2020 7:31 AM    Neuro intact.  No neck or groin hematoma SBP 170 by aline on 3 mcg dopamine Titrate dopamine to keep BP 100-170 D/c home when off dopamine Plavix aspirin statin  Ruta Hinds, MD [facility] [place] Office: [phone]

## 2020-04-16 NOTE — Progress Notes (Signed)
Physical Therapy Treatment Patient Details Name: Ronald Cameron MRN: [id number] DOB: 06/03/1956 Today's Date: 04/16/2020    History of Present Illness Pt is a 64 y/o male admitted 04/09/20 with chest discomfort and dizziness. Cath revealed left ICA stenosis with relatively high lesion extending to C2 level. MRI revealed acute R anterior and temporal CVA. S/p L transcarotid artery revascularization (TCAR) 7/21. PMH includes RA, diverticulosis.   PT Comments    Pt now s/p L TCAR on 7/21. Limited by symptomatic hypotension (see values below) with mobility. Despite this, pt moving well at min guard-level due to symptoms. HR 60-70s; SpO2 >/95% on RA. Pt motivated to participate and happy to be OOB. Encouraged more frequent mobility with nursing staff.  Orthostatic BPs Supine 124/71  Sitting 89/64  Standing 80/50  Standing after 2-min 72/50  Seated rest, legs elevated 93/66      Follow Up Recommendations  Outpatient PT;Supervision for mobility/OOB     Equipment Recommendations  None recommended by PT    Recommendations for Other Services       Precautions / Restrictions Precautions Precautions: Fall;Other (comment) Precaution Comments: Watch BP Restrictions Weight Bearing Restrictions: No    Mobility  Bed Mobility Overal bed mobility: Modified Independent             General bed mobility comments: HOB elevated  Transfers Overall transfer level: Needs assistance Equipment used: None Transfers: Sit to/from Stand Sit to Stand: Supervision         General transfer comment: Supervision secondary to hypotension and dizziness  Ambulation/Gait Ambulation/Gait assistance: Min guard Gait Distance (Feet): 12 Feet Assistive device: None Gait Pattern/deviations: Step-through pattern;Wide base of support;Decreased stride length     General Gait Details: Slow, steady gait with min guard secondary to hypotension and c/o dizziness; further mobility deferred secondary  to symptomatic BP down to 72/50   Stairs             Wheelchair Mobility    Modified Rankin (Stroke Patients Only)       Balance Overall balance assessment: Needs assistance Sitting-balance support: Feet supported Sitting balance-Leahy Scale: Good     Standing balance support: No upper extremity supported;During functional activity Standing balance-Leahy Scale: Good                              Cognition Arousal/Alertness: Awake/alert Behavior During Therapy: WFL for tasks assessed/performed Overall Cognitive Status: Within Functional Limits for tasks assessed                                        Exercises      General Comments        Pertinent Vitals/Pain Pain Assessment: Faces Faces Pain Scale: Hurts little more Pain Location: Neck incision; generalized stiffness Pain Descriptors / Indicators: Sore Pain Intervention(s): Monitored during session    Home Living                      Prior Function            PT Goals (current goals can now be found in the care plan section) Acute Rehab PT Goals Patient Stated Goal: Get back to my gardening PT Goal Formulation: With patient Time For Goal Achievement: 04/25/20 Potential to Achieve Goals: Good Progress towards PT goals: Progressing toward goals    Frequency  Min 3X/week      PT Plan Current plan remains appropriate    Co-evaluation              AM-PAC PT "6 Clicks" Mobility   Outcome Measure  Help needed turning from your back to your side while in a flat bed without using bedrails?: None Help needed moving from lying on your back to sitting on the side of a flat bed without using bedrails?: None Help needed moving to and from a bed to a chair (including a wheelchair)?: None Help needed standing up from a chair using your arms (e.g., wheelchair or bedside chair)?: None Help needed to walk in hospital room?: A Little Help needed climbing 3-5  steps with a railing? : A Little 6 Click Score: 22    End of Session   Activity Tolerance: Patient tolerated treatment well;Treatment limited secondary to medical complications (Comment) Patient left: in chair;with call bell/phone within reach Nurse Communication: Mobility status PT Visit Diagnosis: Other abnormalities of gait and mobility (R26.89);Dizziness and giddiness (R42)     Time: 8366-2947 PT Time Calculation (min) (ACUTE ONLY): 24 min  Charges:  $Therapeutic Exercise: 8-22 mins $Therapeutic Activity: 8-22 mins                     Mabeline Caras, PT, DPT Acute Rehabilitation Services  Pager [phone] Office [phone]  Derry Lory 04/16/2020, 2:32 PM

## 2020-04-16 NOTE — Progress Notes (Signed)
STROKE TEAM PROGRESS NOTE   INTERVAL HISTORY Patient is lying in bed.  Is comfortable.   He had TCAR procedure yesterday which was uneventful but he is having hypotension and is on dopamine drip.  Some soreness at the neck surgical incision site but no neurological deficits. OBJECTIVE Vitals:   04/16/20 0900 04/16/20 0920 04/16/20 0938 04/16/20 1200  BP: 126/63 109/63 99/65 (!) 108/61  Pulse: (!) 50 (!) 53 63 49  Resp: 18 16 14 15   Temp: 98.2 F (36.8 C) 98.3 F (36.8 C) 98.3 F (36.8 C) 97.7 F (36.5 C)  TempSrc: Oral Oral Oral Oral  SpO2: 98% 93% 93% 95%  Weight:      Height:        CBC:  Recent Labs  Lab 04/15/20 1650 04/16/20 0412  WBC 11.8* 9.6  HGB 14.0 13.7  HCT 43.1 41.0  MCV 98.2 97.9  PLT 402* 458    Basic Metabolic Panel:  Recent Labs  Lab 04/10/20 0436 04/11/20 0716 04/15/20 0623 04/15/20 0623 04/15/20 1650 04/16/20 0412  NA 135   < > 139  --   --  138  K 3.8   < > 3.8  --   --  3.6  CL 110   < > 109  --   --  111  CO2 17*   < > 21*  --   --  20*  GLUCOSE 108*   < > 78  --   --  86  BUN 17   < > 17  --   --  12  CREATININE 0.99   < > 1.23   < > 1.14 0.99  CALCIUM 8.1*   < > 8.5*  --   --  8.0*  MG 1.9  --   --   --   --  1.7   < > = values in this interval not displayed.    Lipid Panel:     Component Value Date/Time   CHOL 235 (H) 04/10/2020 0436   TRIG 96 04/10/2020 0436   HDL 32 (L) 04/10/2020 0436   CHOLHDL 7.3 04/10/2020 0436   VLDL 19 04/10/2020 0436   LDLCALC 184 (H) 04/10/2020 0436   HgbA1c:  Lab Results  Component Value Date   HGBA1C 5.7 (H) 04/11/2020   Urine Drug Screen: No results found for: LABOPIA, COCAINSCRNUR, LABBENZ, AMPHETMU, THCU, LABBARB  Alcohol Level No results found for: ETH  IMAGING past 24h Structural Heart Procedure  Result Date: 04/15/2020 See surgical note for result.  HYBRID OR IMAGING ([facility] ONLY)  Result Date: 04/15/2020 There is no interpretation for this exam.  This order is for images obtained  during a surgical procedure.  Please See "Surgeries" Tab for more information regarding the procedure.     PHYSICAL EXAM    Blood pressure (!) 108/61, pulse 49, temperature 97.7 F (36.5 C), temperature source Oral, resp. rate 15, height 5\' 11"  (1.803 m), weight 99.4 kg, SpO2 95 %. Pleasant middle-age mildly obese Caucasian male not in distress. . Afebrile. Head is nontraumatic. Neck is supple without bruit.    Cardiac exam no murmur or gallop. Lungs are clear to auscultation. Distal pulses are well felt.  Surgical scar from TCAR procedure the left neck Neurological Exam ;  Awake  Alert oriented x 3. Normal speech and language.eye movements full without nystagmus.fundi were not visualized. Vision acuity and fields appear normal. Hearing is normal. Palatal movements are normal. Face symmetric. Tongue midline. Normal strength, tone, reflexes and coordination.  Normal sensation. Gait deferred.   ASSESSMENT/PLAN [name] is a 64 y.o. male with history of rheumatoid arthritis and osteoarthritis who presented to ED on 04/09/20 for evaluation of chest pain, palpitations and positional dizziness -> cardiac cath 7/16 ->  nonobstructive CAD -> CTA head and neck that showed a near occlusive left internal carotid stenosis -> scheduled for outpatient left internal carotid stent on 04/21/2020 with Dr. Oneida Alar. MRI obtained for ongoing dizziness and intermittently blurred vision -> two punctate high right frontal areas of acute infarction. He did not receive IV t-PA as it was felt that strokes on MRI were likely incidental.  Stroke: Two punctate right frontal acute infarcts - likely subcortical in setting of uncontrollled risk factors, lacunar  source  MRI head - Two punctate right frontal acute infarcts  CTA Neck - Severe near occlusive atheromatous stenosis involving the proximal left ICA.  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  Carotid Doppler - Left  Carotid:  80-99% stenosis. ECA >50% stenosis  2D Echo - EF 55 to 60%. No cardiac source of emboli identified.   Sars Corona Virus 2 - negative  LDL - 184  HgbA1c - 5.7  VTE prophylaxis - none  aspirin 81 mg daily prior to admission, now on aspirin 81 mg daily and clopidogrel 75 mg daily  Therapy recommendations:  OP PT, no OT  Disposition:  Pending  Hemodynamic TIAs in setting of symptomatic L ICA stenosis  Carotid Doppler - Left Carotid: 80-99% stenosis.   For TCAR today at 2p ([place])  Hyperlipidemia  Home Lipid lowering medication: none  LDL 184, goal < 70  Current lipid lowering medication: Lipitor 80 mg daily   Continue statin at discharge  Other Stroke Risk Factors  Former cigarette smoker - quit  Obesity, Body mass index is 30.56 kg/m., recommend weight loss, diet and exercise as appropriate  Nonobstructive CAD  Other Active Problems  RA - prednisone ; methotrexate ; tramadol ; Xeljanz  Bradycardia  Hospital day # 6  Continue aspirin and Brilinta and wean off dopamine drip and mobilize as tolerated.  Hopefully discharge home in the next couple of days.  Stroke team will sign off.  Kindly call for questions..  Discussion with patient   and answered questions  Ronald Contras, MD Medical Director [facility] Pager: [phone] 04/16/2020 12:27 PM To contact Stroke Continuity provider, please refer to [email or web address] After hours, contact General Neurology

## 2020-04-16 NOTE — Progress Notes (Signed)
Mobility Specialist - Progress Note   04/16/20 1556  Mobility  Activity Ambulated in hall  Level of Assistance Contact guard assist, steadying assist  Assistive Device None  Distance Ambulated (ft) 15 ft  Mobility Response Tolerated poorly  Mobility performed by Mobility specialist  $Mobility charge 1 Mobility    Pre-mobility:   Sitting: 69 HR, 79/60 BP, 96% SpO2  Standing: 81 HR, 88/65 BP, 97% SpO2  Standing at 3 min: 80 HR, 80/66 BP, 97% SpO2 Post-mobility: 45-60 HR, 110/64 BP, 100% SpO2  Pt endorsed feeling lightheaded while ambulating. Pt bradycardic after ambulating, RN notified.   Pricilla Handler Mobility Specialist Mobility Specialist Phone: [phone]

## 2020-04-16 NOTE — Progress Notes (Signed)
PROGRESS NOTE    MARCIA LEPERA  EXN:[id number] DOB: 1956/01/26 DOA: 04/09/2020 PCP: Claretta Fraise, MD   Brief Narrative:   Patient is a 64 year old male with history of rheumatoid arthritis, chronic pain syndrome, diverticulitis who presents to the emergency department with complaints of palpitations, chest discomfort. On presentation, EKG showed inferolateral T wave inversions. CT head was negative. Acute intracranial abnormalities. CT chest/abdomen/pelvis was negative for aortic dissection or aneurysm. CTA neck was concerning of severe near occlusive atheromatous stenosis involving the left ICA. Cardiology and vascular surgery consulted. He underwent cardiac catheter with finding of nonobstructive coronary disease. Venous duplex of the carotid arteries confirmed 80 to 99% stenosis of the left ICA. Vascular surgery initially planning for stent placement as an outpatient. He was planned for discharge on 04/11/20 but he continue to remain dizzy. MRI of the brain showed 2 punctate right frontal acute infarcts. Neurology consulted. Due to suspicion of ongoing TIA, vascular surgery again notified.Plan for carotoid stenting on Wednesday.  7/22: Hypotensive ON. Got fluids, started on dopamine gtt. BP ok this AM. Wean dopamine as able. Get mobilized. Hopefully home in next day or two.   Assessment & Plan:   Principal Problem:   Chest pain Active Problems:   Rheumatoid arthritis (HCC)   Carotid stenosis   Hypokalemia   Acute ischemic stroke (HCC)   Hyperlipidemia  Dizziness     - Complained of  intermittent dizziness with left blurry vision while ambulating.      - Continue meclizine for symptomatic relief for now.     - denies complaints today  Acute ischemic stroke     - MRI of the brain showed 2 punctuate right frontal acute infarcts.      - This is most likely associated with post cath complication and unlikely the cause of dizziness.       - PT/OT/speech evaluation  done: OP PT, no OT      - Hemoglobin A1c of 5.7. LDL of 184.       - Continue Lipitor, aspirin, Plavix.     - Carotid stenosis noted, see below     - Neurology has s/o'd.   Severe carotid stenosis     - CTA neck and venous duplex of the carotid artery confirmed severe stenosis of the left ICA.      - Vascular surgery was consulted.       - Continue aspirin and Plavix.     - Due to ongoing neurological symptom, vascular surgery placed stent; appreciate assistance     - 7/22: Now s/p carotid stent placement. ASA, plavix.  Chest pain/palpitations     - Presented with 1 week history of intermittent palpitations, chest discomfort.       - EKG showed T wave inversions.       - Troponins were normal.       - No acute pathology as per CT chest abdomen/pelvis.       - Cardiology was consulted and he underwent cardiac cath which showed proximal RCA lesion with 60% stenosis but nonobstructive.       - Echo showed ejection fraction of 55 to 60%, mild left ventricular hypertrophy, grade 1 diastolic dysfunction, no regional wall motion abnormalities.      - Cardiology has signed off; appreciate assistance  Hypokalemia     - replaced     - slowly trending down, we'll get him a little more K+ today; follow  Rheumatoid arthritis      - Stable.  Follow-up outpatient.       Lisette Grinder, prednisone and methotrexate  Hyperlipidemia     - LDL of 184.     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   DVT prophylaxis: heparin Code Status: FULL Family Communication: Spoke with wife by phone.   Status is: Inpatient  Remains inpatient appropriate because:Inpatient level of care appropriate due to severity of illness   Dispo: The patient is from: Home               Anticipated d/c is to: Home              Anticipated d/c date is: 2 days              Patient currently is not medically stable to d/c.  Consultants:   Neurology  Vascular Surgery  Procedures:   Carotid stent placement  LHC  ROS:  Denies CP, N, V, ab pain . Remainder 10-pt ROS is negative for all not previously mentioned.  Subjective: "They told me I couldn't go."  Objective: Vitals:   04/16/20 0900 04/16/20 0920 04/16/20 0938 04/16/20 1200  BP: 126/63 109/63 99/65 (!) 108/61  Pulse: (!) 50 (!) 53 63 49  Resp: 18 16 14 15   Temp: 98.2 F (36.8 C) 98.3 F (36.8 C) 98.3 F (36.8 C) 97.7 F (36.5 C)  TempSrc: Oral Oral Oral Oral  SpO2: 98% 93% 93% 95%  Weight:      Height:        Intake/Output Summary (Last 24 hours) at 04/16/2020 1253 Last data filed at 04/16/2020 0857 Gross per 24 hour  Intake 4319.5 ml  Output 3075 ml  Net 1244.5 ml   Filed Weights   04/14/20 0543 04/15/20 0545 04/15/20 1352  Weight: 99.9 kg 99.4 kg 99.4 kg    Examination:  General: 64 y.o. male resting in bed in NAD Cardiovascular: RRR, +S1, S2, no m/g/r, equal pulses throughout Respiratory: CTABL, no w/r/r, normal WOB GI: BS+, NDNT, no masses noted, no organomegaly noted MSK: No e/c/c Neuro: Alert to name, follows commands Psyc: Appropriate interaction and affect, calm/cooperative   Data Reviewed: I have personally reviewed following labs and imaging studies.  CBC: Recent Labs  Lab 04/10/20 0436 04/11/20 0716 04/15/20 0623 04/15/20 1650 04/16/20 0412  WBC 8.2 7.7 9.5 11.8* 9.6  HGB 13.9 14.7 14.9 14.0 13.7  HCT 43.3 45.0 45.3 43.1 41.0  MCV 100.0 98.7 96.8 98.2 97.9  PLT 436* 392 423* 402* 269   Basic Metabolic Panel: Recent Labs  Lab 04/09/20 1536 04/09/20 1536 04/10/20 0436 04/11/20 0716 04/15/20 0623 04/15/20 1650 04/16/20 0412  NA 138  --  135 138 139  --  138  K 3.3*  --  3.8 4.0 3.8  --  3.6  CL 107  --  110 110 109  --  111  CO2 21*  --  17* 19* 21*   --  20*  GLUCOSE 134*  --  108* 92 78  --  86  BUN 18  --  17 17 17   --  12  CREATININE 1.20   < > 0.99 1.13 1.23 1.14 0.99  CALCIUM 8.5*  --  8.1* 8.1* 8.5*  --  8.0*  MG  --   --  1.9  --   --   --  1.7   < > = values in this interval not displayed.   GFR: Estimated Creatinine Clearance: 94.1 mL/min (by C-G formula based on SCr of 0.99 mg/dL). Liver Function Tests: No results for input(s): AST, ALT, ALKPHOS, BILITOT, PROT, ALBUMIN in the last 168 hours. No results for input(s): LIPASE, AMYLASE in the last 168 hours. No results for input(s): AMMONIA in the last 168 hours. Coagulation Profile: No results for input(s): INR, PROTIME in the last 168 hours. Cardiac Enzymes: Recent Labs  Lab 04/10/20 0436  CKTOTAL 42*   BNP (last 3 results) No results for input(s): PROBNP in the last 8760 hours. HbA1C: No results for input(s): HGBA1C in the last 72 hours. CBG: No results for input(s): GLUCAP in the last 168 hours. Lipid Profile: No results for input(s): CHOL, HDL, LDLCALC, TRIG, CHOLHDL, LDLDIRECT in the last 72 hours. Thyroid Function Tests: No results for input(s): TSH, T4TOTAL, FREET4, T3FREE, THYROIDAB in the last 72 hours. Anemia Panel: No results for input(s): VITAMINB12, FOLATE, FERRITIN, TIBC, IRON, RETICCTPCT in the last 72 hours. Sepsis Labs: No results for input(s): PROCALCITON, LATICACIDVEN in the last 168 hours.  Recent Results (from the past 240 hour(s))  SARS Coronavirus 2 by RT PCR (hospital order, performed in [facility] hospital lab) Nasopharyngeal Nasopharyngeal Swab     Status: None   Collection Time: 04/09/20 11:39 PM   Specimen: Nasopharyngeal Swab  Result Value Ref Range Status   SARS Coronavirus 2 NEGATIVE NEGATIVE Final    Comment: (NOTE) SARS-CoV-2 target nucleic acids are NOT DETECTED.  The SARS-CoV-2 RNA is generally detectable in upper and lower respiratory specimens during the acute phase of infection. The lowest concentration of SARS-CoV-2  viral copies this assay can detect is 250 copies / mL. A negative result does not preclude SARS-CoV-2 infection and should not be used as the sole basis for treatment or other patient management decisions.  A negative result may occur with improper specimen collection / handling, submission of specimen other than nasopharyngeal swab, presence of viral mutation(s) within the areas targeted by this assay, and inadequate number of viral copies (<250 copies / mL). A negative result must be combined with clinical observations, patient history, and epidemiological information.  Fact Sheet for Patients:   StrictlyIdeas.no  Fact Sheet for Healthcare Providers: BankingDealers.co.za  This test is not yet approved or  cleared by the [place] FDA and has been authorized for detection and/or diagnosis of SARS-CoV-2 by FDA under an Emergency Use Authorization (EUA).  This EUA will remain in effect (meaning this test can be used) for the duration of the COVID-19 declaration under Section 564(b)(1) of the Act, 21 U.S.C. section 360bbb-3(b)(1), unless the authorization is terminated or revoked sooner.  Performed at [facility], [place] [address] [place],  [address]   Surgical pcr screen     Status: None   Collection Time: 04/15/20 11:25 AM   Specimen: Nasal Mucosa; Nasal Swab  Result Value Ref Range Status   MRSA, PCR NEGATIVE NEGATIVE Final   Staphylococcus aureus NEGATIVE NEGATIVE Final    Comment: (NOTE) The Xpert SA Assay (FDA approved for NASAL specimens in patients 62 years of age and older), is one component of a comprehensive surveillance program. It is not intended to diagnose infection nor to guide or monitor treatment. Performed at [facility], [place]  [address], [place], [place] [address]       Radiology Studies: Structural Heart Procedure  Result Date: 04/15/2020 See surgical note for result.  HYBRID OR  IMAGING ([facility] ONLY)  Result Date: 04/15/2020 There is no interpretation for this exam.  This order is for images obtained during a surgical procedure.  Please See "Surgeries" Tab for more information regarding the procedure.     Scheduled Meds: . aspirin  81 mg Oral Daily  . atorvastatin  80 mg Oral Daily  . clopidogrel  75 mg Oral Daily  . docusate sodium  100 mg Oral Daily  . folic acid  1 mg Oral Daily  . heparin  5,000 Units Subcutaneous Q8H  . pantoprazole  40 mg Oral Daily  . predniSONE  10 mg Oral Daily  . sodium chloride flush  3 mL Intravenous Q12H  . sodium chloride flush  3 mL Intravenous Q12H  . Tofacitinib Citrate ER  11 mg Oral Daily   Continuous Infusions: . sodium chloride    . DOPamine 4 mcg/kg/min (04/16/20 0818)  . lactated ringers 10 mL/hr at 04/15/20 1404  . magnesium sulfate bolus IVPB       LOS: 6 days    Time spent: 25 minutes spent in the coordination of care today.    Jonnie Finner, DO [facility]  If 7PM-7AM, please contact night-coverage [email or web address] 04/16/2020, 12:53 PM

## 2020-04-16 NOTE — Plan of Care (Signed)
  Problem: Health Behavior/Discharge Planning: Goal: Ability to manage health-related needs will improve Outcome: Progressing   

## 2020-04-17 LAB — RENAL FUNCTION PANEL
Albumin: 2.8 g/dL — ABNORMAL LOW (ref 3.5–5.0)
Anion gap: 8 (ref 5–15)
BUN: 7 mg/dL — ABNORMAL LOW (ref 8–23)
CO2: 21 mmol/L — ABNORMAL LOW (ref 22–32)
Calcium: 8.5 mg/dL — ABNORMAL LOW (ref 8.9–10.3)
Chloride: 108 mmol/L (ref 98–111)
Creatinine, Ser: 0.95 mg/dL (ref 0.61–1.24)
GFR calc Af Amer: >60 mL/min (ref >60)
GFR calc non Af Amer: >60 mL/min (ref >60)
Glucose, Bld: 133 mg/dL — ABNORMAL HIGH (ref 70–99)
Phosphorus: 3 mg/dL (ref 2.5–4.6)
Potassium: 4 mmol/L (ref 3.5–5.1)
Sodium: 137 mmol/L (ref 135–145)

## 2020-04-17 LAB — CBC WITH DIFFERENTIAL/PLATELET
Abs Immature Granulocytes: 0.04 10*3/uL (ref 0.00–0.07)
Basophils Absolute: 0.1 10*3/uL (ref 0.0–0.1)
Basophils Relative: 1 %
Eosinophils Absolute: 0.3 10*3/uL (ref 0.0–0.5)
Eosinophils Relative: 4 %
HCT: 43.5 % (ref 39.0–52.0)
Hemoglobin: 14.3 g/dL (ref 13.0–17.0)
Immature Granulocytes: 0 %
Lymphocytes Relative: 18 %
Lymphs Abs: 1.6 10*3/uL (ref 0.7–4.0)
MCH: 31.8 pg (ref 26.0–34.0)
MCHC: 32.9 g/dL (ref 30.0–36.0)
MCV: 96.9 fL (ref 80.0–100.0)
Monocytes Absolute: 1 10*3/uL (ref 0.1–1.0)
Monocytes Relative: 11 %
Neutro Abs: 6.1 10*3/uL (ref 1.7–7.7)
Neutrophils Relative %: 66 %
Platelets: 394 10*3/uL (ref 150–400)
RBC: 4.49 MIL/uL (ref 4.22–5.81)
RDW: 14.4 % (ref 11.5–15.5)
WBC: 9.2 10*3/uL (ref 4.0–10.5)
nRBC: 0 % (ref 0.0–0.2)

## 2020-04-17 LAB — MAGNESIUM: Magnesium: 2 mg/dL (ref 1.7–2.4)

## 2020-04-17 LAB — POCT ACTIVATED CLOTTING TIME: Activated Clotting Time: 285 seconds

## 2020-04-17 NOTE — Progress Notes (Signed)
PROGRESS NOTE    Ronald Cameron  BJS:[id number] DOB: 08/10/1956 DOA: 04/09/2020 PCP: Claretta Fraise, MD   Brief Narrative:   Patient is a 64 year old male with history of rheumatoid arthritis, chronic pain syndrome, diverticulitis who presents to the emergency department with complaints of palpitations, chest discomfort. On presentation, EKG showed inferolateral T wave inversions. CT head was negative. Acute intracranial abnormalities. CT chest/abdomen/pelvis was negative for aortic dissection or aneurysm. CTA neck was concerning of severe near occlusive atheromatous stenosis involving the left ICA. Cardiology and vascular surgery consulted. He underwent cardiac catheter with finding of nonobstructive coronary disease. Venous duplex of the carotid arteries confirmed 80 to 99% stenosis of the left ICA. Vascular surgery initially planning for stent placement as an outpatient. He was planned for discharge on 04/11/20 but he continue to remain dizzy. MRI of the brain showed 2 punctate right frontal acute infarcts. Neurology consulted. Due to suspicion of ongoing TIA, vascular surgery again notified.Plan for carotoid stenting on Wednesday.  7/23: BP/HR continue to be labile. Continuing dopamine. Wean as able.  Labwork is otherwise stable.   Assessment & Plan:   Principal Problem:   Chest pain Active Problems:   Rheumatoid arthritis (HCC)   Carotid stenosis   Hypokalemia   Acute ischemic stroke (HCC)   Hyperlipidemia  Dizziness     - Complained of  intermittent dizziness with left blurry vision while ambulating.      - Continue meclizine for symptomatic relief for now.     - resolved  Acute ischemic stroke     - MRI of the brain showed 2 punctuate right frontal acute infarcts.      - This is most likely associated with post cath complication and unlikely the cause of dizziness.       - PT/OT/speech evaluation done: OP PT, no OT      - Hemoglobin A1c of 5.7. LDL of  184.       - Continue Lipitor, aspirin, Plavix.     - Carotid stenosis noted, see below     - Neurology has s/o'd.   Severe carotid stenosis     - CTA neck and venous duplex of the carotid artery confirmed severe stenosis of the left ICA.      - Vascular surgery was consulted.       - Continue aspirin and Plavix.     - Due to ongoing neurological symptom, vascular surgery placed stent; appreciate assistance     - s/p carotid stent placement. ASA, plavix.  Labile BP/HR     - likely baroreceptor issue post stent placement. Vasc surgery following; continue dopamine  Chest pain/palpitations     - Presented with 1 week history of intermittent palpitations, chest discomfort.       - EKG showed T wave inversions.       - Troponins were normal.       - No acute pathology as per CT chest abdomen/pelvis.       - Cardiology was consulted and he underwent cardiac cath which showed proximal RCA lesion with 60% stenosis but nonobstructive.       - Echo showed ejection fraction of 55 to 60%, mild left ventricular hypertrophy, grade 1 diastolic dysfunction, no regional wall motion abnormalities.      - Cardiology has signed off; appreciate assistance  Hypokalemia     - K+ looks good, Mg2+ good. Follow  Rheumatoid arthritis      - Stable. Follow-up outpatient.       -  Takes Morrie Sheldon, prednisone and methotrexate  Hyperlipidemia     - LDL of 184.     - continue lipitor  DVT prophylaxis: heparin Code Status: FULL Family Communication: None at bedside   Status is: Inpatient  Remains inpatient appropriate because:Inpatient level of care appropriate due to severity of illness   Dispo: The patient is from: Home              Anticipated d/c is to: Home              Anticipated d/c date is: 2 days              Patient currently is not medically stable to d/c.  Consultants:   Neurology  Vascular Surgery  Cardiology  Procedures:   Carotid Stent placement  LHC  ROS:  DenieS HA,  N, V, ab pain, CP. Remainder ROS is negative for all not previously mentioned.  Subjective: "It seems like it's all over the place. Never had my heart that low."  Objective: Vitals:   04/17/20 0641 04/17/20 0710 04/17/20 0720 04/17/20 1240  BP: (!) 118/63 111/68 122/72 125/65  Pulse: 65 51 54 46  Resp: 16 15 13 15   Temp:  97.6 F (36.4 C)  98 F (36.7 C)  TempSrc:  Oral  Oral  SpO2: 98% 96% 96% 98%  Weight:      Height:        Intake/Output Summary (Last 24 hours) at 04/17/2020 1611 Last data filed at 04/17/2020 1255 Gross per 24 hour  Intake 367.89 ml  Output 1955 ml  Net -1587.11 ml   Filed Weights   04/14/20 0543 04/15/20 0545 04/15/20 1352  Weight: 99.9 kg 99.4 kg 99.4 kg    Examination:  General: 64 y.o. male resting in bed in NAD Cardiovascular: brady, +S1, S2, no g/r; 1/6 SEM Respiratory: CTABL, no w/r/r, normal WOB GI: BS+, NDNT, soft MSK: No e/c/c Neuro: A&O x 3, no focal deficits Psyc: Appropriate interaction and affect, calm/cooperative   Data Reviewed: I have personally reviewed following labs and imaging studies.  CBC: Recent Labs  Lab 04/11/20 0716 04/15/20 0623 04/15/20 1650 04/16/20 0412 04/17/20 0317  WBC 7.7 9.5 11.8* 9.6 9.2  NEUTROABS  --   --   --   --  6.1  HGB 14.7 14.9 14.0 13.7 14.3  HCT 45.0 45.3 43.1 41.0 43.5  MCV 98.7 96.8 98.2 97.9 96.9  PLT 392 423* 402* 381 235   Basic Metabolic Panel: Recent Labs  Lab 04/11/20 0716 04/15/20 0623 04/15/20 1650 04/16/20 0412 04/17/20 0317  NA 138 139  --  138 137  K 4.0 3.8  --  3.6 4.0  CL 110 109  --  111 108  CO2 19* 21*  --  20* 21*  GLUCOSE 92 78  --  86 133*  BUN 17 17  --  12 7*  CREATININE 1.13 1.23 1.14 0.99 0.95  CALCIUM 8.1* 8.5*  --  8.0* 8.5*  MG  --   --   --  1.7 2.0  PHOS  --   --   --   --  3.0   GFR: Estimated Creatinine Clearance: 98.1 mL/min (by C-G formula based on SCr of 0.95 mg/dL). Liver Function Tests: Recent Labs  Lab 04/17/20 0317  ALBUMIN 2.8*    No results for input(s): LIPASE, AMYLASE in the last 168 hours. No results for input(s): AMMONIA in the last 168 hours. Coagulation Profile: No results for input(s): INR, PROTIME  in the last 168 hours. Cardiac Enzymes: No results for input(s): CKTOTAL, CKMB, CKMBINDEX, TROPONINI in the last 168 hours. BNP (last 3 results) No results for input(s): PROBNP in the last 8760 hours. HbA1C: No results for input(s): HGBA1C in the last 72 hours. CBG: No results for input(s): GLUCAP in the last 168 hours. Lipid Profile: No results for input(s): CHOL, HDL, LDLCALC, TRIG, CHOLHDL, LDLDIRECT in the last 72 hours. Thyroid Function Tests: No results for input(s): TSH, T4TOTAL, FREET4, T3FREE, THYROIDAB in the last 72 hours. Anemia Panel: No results for input(s): VITAMINB12, FOLATE, FERRITIN, TIBC, IRON, RETICCTPCT in the last 72 hours. Sepsis Labs: No results for input(s): PROCALCITON, LATICACIDVEN in the last 168 hours.  Recent Results (from the past 240 hour(s))  SARS Coronavirus 2 by RT PCR (hospital order, performed in [facility] hospital lab) Nasopharyngeal Nasopharyngeal Swab     Status: None   Collection Time: 04/09/20 11:39 PM   Specimen: Nasopharyngeal Swab  Result Value Ref Range Status   SARS Coronavirus 2 NEGATIVE NEGATIVE Final    Comment: (NOTE) SARS-CoV-2 target nucleic acids are NOT DETECTED.  The SARS-CoV-2 RNA is generally detectable in upper and lower respiratory specimens during the acute phase of infection. The lowest concentration of SARS-CoV-2 viral copies this assay can detect is 250 copies / mL. A negative result does not preclude SARS-CoV-2 infection and should not be used as the sole basis for treatment or other patient management decisions.  A negative result may occur with improper specimen collection / handling, submission of specimen other than nasopharyngeal swab, presence of viral mutation(s) within the areas targeted by this assay, and inadequate number  of viral copies (<250 copies / mL). A negative result must be combined with clinical observations, patient history, and epidemiological information.  Fact Sheet for Patients:   StrictlyIdeas.no  Fact Sheet for Healthcare Providers: BankingDealers.co.za  This test is not yet approved or  cleared by the [place] FDA and has been authorized for detection and/or diagnosis of SARS-CoV-2 by FDA under an Emergency Use Authorization (EUA).  This EUA will remain in effect (meaning this test can be used) for the duration of the COVID-19 declaration under Section 564(b)(1) of the Act, 21 U.S.C. section 360bbb-3(b)(1), unless the authorization is terminated or revoked sooner.  Performed at [facility], [place] [address], [place], [place] [address]   Surgical pcr screen     Status: None   Collection Time: 04/15/20 11:25 AM   Specimen: Nasal Mucosa; Nasal Swab  Result Value Ref Range Status   MRSA, PCR NEGATIVE NEGATIVE Final   Staphylococcus aureus NEGATIVE NEGATIVE Final    Comment: (NOTE) The Xpert SA Assay (FDA approved for NASAL specimens in patients 61 years of age and older), is one component of a comprehensive surveillance program. It is not intended to diagnose infection nor to guide or monitor treatment. Performed at [facility], [place] [address], [place], [place] [address]       Radiology Studies: Structural Heart Procedure  Result Date: 04/15/2020 See surgical note for result.    Scheduled Meds: . aspirin  81 mg Oral Daily  . atorvastatin  80 mg Oral Daily  . clopidogrel  75 mg Oral Daily  . docusate sodium  100 mg Oral Daily  . folic acid  1 mg Oral Daily  . heparin  5,000 Units Subcutaneous Q8H  . pantoprazole  40 mg Oral Daily  . predniSONE  10 mg Oral Daily  . sodium chloride flush  3 mL Intravenous Q12H  . sodium chloride flush  3 mL Intravenous Q12H  . Tofacitinib Citrate ER  11 mg Oral Daily    Continuous Infusions: . sodium chloride    . DOPamine 3.5 mcg/kg/min (04/16/20 1800)  . lactated ringers 10 mL/hr at 04/15/20 1404  . magnesium sulfate bolus IVPB       LOS: 7 days    Time spent: 25 minutes spent in the coordination of care today.   Jonnie Finner, DO [facility]  If 7PM-7AM, please contact night-coverage [email or web address] 04/17/2020, 4:11 PM

## 2020-04-17 NOTE — Progress Notes (Addendum)
  Progress Note    04/17/2020 7:39 AM 2 Days Post-Op  Subjective: Says he did not rest very well secondary to fluctuations in heart rate and monitor alarm.  He is awake and alert denies pain.  Vitals:   04/17/20 0323 04/17/20 0641  BP: (!) 119/58 (!) 118/63  Pulse: 60 65  Resp: 13 16  Temp: (!) 97.5 F (36.4 C)   SpO2: 98% 98%    Physical Exam: Neuro: A and O x 4.  Face is symmetrical.  Tongue is midline Cardiac: Heart rate currently in the low 50s.  Rhythm is regular Lungs: Clear to auscultation bilaterally Incisions: Neck incision well approximated without hematoma Extremities: Moves all extremities well.  5 out of 5 bilateral hand grip strength   CBC    Component Value Date/Time   WBC 9.2 04/17/2020 0317   RBC 4.49 04/17/2020 0317   HGB 14.3 04/17/2020 0317   HCT 43.5 04/17/2020 0317   PLT 394 04/17/2020 0317   MCV 96.9 04/17/2020 0317   MCH 31.8 04/17/2020 0317   MCHC 32.9 04/17/2020 0317   RDW 14.4 04/17/2020 0317   LYMPHSABS 1.6 04/17/2020 0317   MONOABS 1.0 04/17/2020 0317   EOSABS 0.3 04/17/2020 0317   BASOSABS 0.1 04/17/2020 0317    BMET    Component Value Date/Time   NA 137 04/17/2020 0317   K 4.0 04/17/2020 0317   CL 108 04/17/2020 0317   CO2 21 (L) 04/17/2020 0317   GLUCOSE 133 (H) 04/17/2020 0317   BUN 7 (L) 04/17/2020 0317   CREATININE 0.95 04/17/2020 0317   CALCIUM 8.5 (L) 04/17/2020 0317   GFRNONAA >60 04/17/2020 0317   GFRAA >60 04/17/2020 0317     Intake/Output Summary (Last 24 hours) at 04/17/2020 0739 Last data filed at 04/17/2020 0430 Gross per 24 hour  Intake 267.89 ml  Output 3180 ml  Net -2912.11 ml    HOSPITAL MEDICATIONS Scheduled Meds: . aspirin  81 mg Oral Daily  . atorvastatin  80 mg Oral Daily  . clopidogrel  75 mg Oral Daily  . docusate sodium  100 mg Oral Daily  . folic acid  1 mg Oral Daily  . heparin  5,000 Units Subcutaneous Q8H  . pantoprazole  40 mg Oral Daily  . predniSONE  10 mg Oral Daily  . sodium  chloride flush  3 mL Intravenous Q12H  . sodium chloride flush  3 mL Intravenous Q12H  . Tofacitinib Citrate ER  11 mg Oral Daily   Continuous Infusions: . sodium chloride    . DOPamine 3.5 mcg/kg/min (04/16/20 1800)  . lactated ringers 10 mL/hr at 04/15/20 1404  . magnesium sulfate bolus IVPB     PRN Meds:.sodium chloride, acetaminophen **OR** acetaminophen, alum & mag hydroxide-simeth, guaiFENesin-dextromethorphan, hydrALAZINE, labetalol, magnesium sulfate bolus IVPB, meclizine, metoprolol tartrate, morphine injection, nitroGLYCERIN, ondansetron (ZOFRAN) IV, oxyCODONE-acetaminophen, phenol, potassium chloride, sodium chloride flush, traMADol, traZODone  Assessment: POD 2  Left TCAR secondary to symptomatic left ICA stenosis. Neuro intact. Labs stable. Heart rate dropping when supine>>improves with HOB elevated. Hypotension requiring dopamine. Systolic range overnight 867Y.   Plan: -Will discuss with Dr. Oneida Alar appropriate timing of stopping dopamine and monitoring. -Continue current treatment plan for now -DVT prophylaxis:  Heparin [place]   Risa Grill, PA-C [facility] [phone] 04/17/2020  7:39 AM   Wean dopamine for SBP > 100 Can d/c home when BP stable  Ruta Hinds, MD [facility] [place] Office: [phone]

## 2020-04-17 NOTE — Progress Notes (Signed)
Discussed patient's progress this afternoon with attendant RN. Patient has been OOB and ambulated with mobility specialist in the hallway. (Notes are reviewed.)  Systolic BP 90 and 720  and HR 65 and 68 during mobility. Patient without symptoms. Continue dopamine.

## 2020-04-17 NOTE — Progress Notes (Signed)
Mobility Specialist - Progress Note   04/17/20 1443  Mobility  Activity Ambulated in hall  Level of Assistance Independent  Assistive Device None  Distance Ambulated (ft) 960 ft  Mobility Response Tolerated well  Mobility performed by Mobility specialist  $Mobility charge 1 Mobility    Pre-mobility:   Supine: 49 HR, 137/67 BP, 99% SpO2  Sitting: 59 HR, 111/66 BP, 99% SpO2  Standing: 63 HR, 93/61 BP, 99% SpO2 During mobility:   Hallway Lap 1: 65 HR, 103/73 BP, 99% SpO2  Hallway Lap 2: 68 HR, 90/68 BP, 100% SpO2 Post-mobility:   laying Semi-Fowler's: 59 HR, 93/63 BP, 96% SpO2  Pt asx throughout ambulation.   Pricilla Handler Mobility Specialist Mobility Specialist Phone: [phone]

## 2020-04-17 NOTE — Progress Notes (Signed)
Physical Therapy Treatment Patient Details Name: Ronald Cameron MRN: [id number] DOB: September 24, 1956 Today's Date: 04/17/2020    History of Present Illness Pt is a 64 y/o male admitted 04/09/20 with chest discomfort and dizziness. Cath revealed left ICA stenosis with relatively high lesion extending to C2 level. MRI revealed acute R anterior and temporal CVA. S/p L transcarotid artery revascularization (TCAR) 7/21. PMH includes RA, diverticulosis.    PT Comments    Pt able to tolerate more ambulation today but BP decr after amb. Pt asymptomatic.  Orthostatic BPs/HR                                                              BP               HR Supine 124/62 45  Sitting 117/92 63  Sitting after amb 76/51 73  Sitting with feet up 5 minutes after amb 81/59 60      Follow Up Recommendations  Outpatient PT;Supervision for mobility/OOB     Equipment Recommendations  None recommended by PT    Recommendations for Other Services       Precautions / Restrictions Precautions Precautions: Fall;Other (comment) Precaution Comments: Watch BP    Mobility  Bed Mobility Overal bed mobility: Modified Independent             General bed mobility comments: HOB elevated  Transfers Overall transfer level: Needs assistance Equipment used: None Transfers: Sit to/from Stand Sit to Stand: Supervision         General transfer comment: Supervision for safety due to BP and HR issues  Ambulation/Gait Ambulation/Gait assistance: Min guard Gait Distance (Feet): 200 Feet Assistive device: None Gait Pattern/deviations: Step-through pattern;Decreased stride length Gait velocity: decr Gait velocity interpretation: 1.31 - 2.62 ft/sec, indicative of limited community ambulator General Gait Details: Assist for safety. Gait slightly guarded   Stairs             Wheelchair Mobility    Modified Rankin (Stroke Patients Only) Modified Rankin (Stroke Patients Only) Pre-Morbid  Rankin Score: No symptoms Modified Rankin: Moderately severe disability     Balance Overall balance assessment: Needs assistance Sitting-balance support: Feet supported Sitting balance-Leahy Scale: Good     Standing balance support: No upper extremity supported;During functional activity Standing balance-Leahy Scale: Good                              Cognition Arousal/Alertness: Awake/alert Behavior During Therapy: WFL for tasks assessed/performed Overall Cognitive Status: Within Functional Limits for tasks assessed                                        Exercises      General Comments        Pertinent Vitals/Pain Pain Assessment: No/denies pain    Home Living                      Prior Function            PT Goals (current goals can now be found in the care plan section) Acute Rehab PT Goals Patient Stated Goal: Get back to  my gardening Progress towards PT goals: Progressing toward goals    Frequency    Min 3X/week      PT Plan Current plan remains appropriate    Co-evaluation              AM-PAC PT "6 Clicks" Mobility   Outcome Measure  Help needed turning from your back to your side while in a flat bed without using bedrails?: None Help needed moving from lying on your back to sitting on the side of a flat bed without using bedrails?: None Help needed moving to and from a bed to a chair (including a wheelchair)?: None Help needed standing up from a chair using your arms (e.g., wheelchair or bedside chair)?: None Help needed to walk in hospital room?: A Little Help needed climbing 3-5 steps with a railing? : A Little 6 Click Score: 22    End of Session   Activity Tolerance: Patient tolerated treatment well Patient left: in chair;with call bell/phone within reach;with family/visitor present Nurse Communication: Mobility status PT Visit Diagnosis: Other abnormalities of gait and mobility  (R26.89);Dizziness and giddiness (R42)     Time: 1040-1101 PT Time Calculation (min) (ACUTE ONLY): 21 min  Charges:  $Gait Training: 8-22 mins                     [place] Pager [phone] Office [place] 04/17/2020, 12:58 PM

## 2020-04-18 LAB — CBC WITH DIFFERENTIAL/PLATELET
Abs Immature Granulocytes: 0.04 10*3/uL (ref 0.00–0.07)
Basophils Absolute: 0.1 10*3/uL (ref 0.0–0.1)
Basophils Relative: 1 %
Eosinophils Absolute: 0.4 10*3/uL (ref 0.0–0.5)
Eosinophils Relative: 4 %
HCT: 42.2 % (ref 39.0–52.0)
Hemoglobin: 14 g/dL (ref 13.0–17.0)
Immature Granulocytes: 0 %
Lymphocytes Relative: 21 %
Lymphs Abs: 2 10*3/uL (ref 0.7–4.0)
MCH: 32.2 pg (ref 26.0–34.0)
MCHC: 33.2 g/dL (ref 30.0–36.0)
MCV: 97 fL (ref 80.0–100.0)
Monocytes Absolute: 1.1 10*3/uL — ABNORMAL HIGH (ref 0.1–1.0)
Monocytes Relative: 12 %
Neutro Abs: 5.8 10*3/uL (ref 1.7–7.7)
Neutrophils Relative %: 62 %
Platelets: 376 10*3/uL (ref 150–400)
RBC: 4.35 MIL/uL (ref 4.22–5.81)
RDW: 14.3 % (ref 11.5–15.5)
WBC: 9.4 10*3/uL (ref 4.0–10.5)
nRBC: 0 % (ref 0.0–0.2)

## 2020-04-18 LAB — RENAL FUNCTION PANEL
Albumin: 2.8 g/dL — ABNORMAL LOW (ref 3.5–5.0)
Anion gap: 10 (ref 5–15)
BUN: 14 mg/dL (ref 8–23)
CO2: 22 mmol/L (ref 22–32)
Calcium: 8.4 mg/dL — ABNORMAL LOW (ref 8.9–10.3)
Chloride: 107 mmol/L (ref 98–111)
Creatinine, Ser: 0.93 mg/dL (ref 0.61–1.24)
GFR calc Af Amer: >60 mL/min (ref >60)
GFR calc non Af Amer: >60 mL/min (ref >60)
Glucose, Bld: 87 mg/dL (ref 70–99)
Phosphorus: 3.5 mg/dL (ref 2.5–4.6)
Potassium: 3.9 mmol/L (ref 3.5–5.1)
Sodium: 139 mmol/L (ref 135–145)

## 2020-04-18 LAB — MAGNESIUM: Magnesium: 2 mg/dL (ref 1.7–2.4)

## 2020-04-18 MED ORDER — SIMETHICONE 80 MG PO CHEW
80.0000 mg | CHEWABLE_TABLET | Freq: Two times a day (BID) | ORAL | Status: DC
  Administered 2020-04-18 – 2020-04-19 (×3): 80 mg via ORAL
  Filled 2020-04-18 (×3): qty 1

## 2020-04-18 MED ORDER — POLYETHYLENE GLYCOL 3350 17 G PO PACK
17.0000 g | PACK | Freq: Every day | ORAL | Status: AC
  Administered 2020-04-18: 17 g via ORAL
  Filled 2020-04-18: qty 1

## 2020-04-18 NOTE — Plan of Care (Signed)
  Problem: Education: Goal: Understanding of cardiac disease, CV risk reduction, and recovery process will improve Outcome: Progressing   Problem: Education: Goal: Individualized Educational Video(s) Outcome: Progressing   

## 2020-04-18 NOTE — Progress Notes (Signed)
PROGRESS NOTE    Ronald Cameron  MHD:[id number] DOB: 04/24/1956 DOA: 04/09/2020 PCP: Claretta Fraise, MD   Brief Narrative:   Patient is a 64 year old male with history of rheumatoid arthritis, chronic pain syndrome, diverticulitis who presents to the emergency department with complaints of palpitations, chest discomfort. On presentation, EKG showed inferolateral T wave inversions. CT head was negative. Acute intracranial abnormalities. CT chest/abdomen/pelvis was negative for aortic dissection or aneurysm. CTA neck was concerning of severe near occlusive atheromatous stenosis involving the left ICA. Cardiology and vascular surgery consulted. He underwent cardiac catheter with finding of nonobstructive coronary disease. Venous duplex of the carotid arteries confirmed 80 to 99% stenosis of the left ICA. Vascular surgery initially planning for stent placement as an outpatient. He was planned for discharge on 04/11/20 but he continue to remain dizzy. MRI of the brain showed 2 punctate right frontal acute infarcts. Neurology consulted. Due to suspicion of ongoing TIA, vascular surgery again notified.Plan for carotoid stenting on Wednesday.  7/24: Continue to wean dopamine. He is otherwise stable. Appreciate vas surg help.   Assessment & Plan:   Principal Problem:   Chest pain Active Problems:   Rheumatoid arthritis (HCC)   Carotid stenosis   Hypokalemia   Acute ischemic stroke (HCC)   Hyperlipidemia  Dizziness - Complained of intermittent dizziness with left blurry vision while ambulating.  - Continue meclizine for symptomatic relief for now. - resolved  Acute ischemic stroke - MRI of the brain showed 2 punctuate right frontal acute infarcts.  - This is most likely associated with post cath complication and unlikely the cause of dizziness.  - PT/OT/speech evaluation done: OP PT, no OT  - Hemoglobin A1c of 5.7. LDL of 184.  -  Continue Lipitor, aspirin, Plavix. - Carotid stenosis noted, see below - Neurology has s/o'd.  Severe carotid stenosis - CTA neck and venous duplex of the carotid artery confirmed severe stenosis of the left ICA.  - Vascular surgery was consulted.  - Continue aspirin and Plavix. - Due to ongoing neurological symptom, vascular surgery placed stent; appreciate assistance - s/p carotid stent placement. ASA, plavix.     - 7/24: remains on dopamine post procedure. Wean as able.   Labile BP/HR     - likely baroreceptor issue post stent placement. Vasc surgery following; continue dopamine     - 7/24: continue to wean dopamine.  Chest pain/palpitations - Presented with 1 week history of intermittent palpitations, chest discomfort.  - EKG showed T wave inversions.  - Troponins were normal.  - No acute pathology as per CT chest abdomen/pelvis.  - Cardiology was consulted and he underwent cardiac cath which showed proximal RCA lesion with 60% stenosis but nonobstructive.  - Echo showed ejection fraction of 55 to 60%, mild left ventricular hypertrophy, grade 1 diastolic dysfunction, no regional wall motion abnormalities.  - Cardiology has signed off; appreciate assistance  Hypokalemia - resolved.  Rheumatoid arthritis  - Stable. Follow-up outpatient.  Lisette Grinder, prednisone and methotrexate  Hyperlipidemia - LDL of 184. - continue lipitor  DVT prophylaxis: heparin Code Status: FULL Family Communication: None at bedside.   Status is: Inpatient  Remains inpatient appropriate because:Inpatient level of care appropriate due to severity of illness   Dispo: The patient is from: Home              Anticipated d/c is to: Home              Anticipated d/c date is: 1 day  Patient currently is not medically stable to d/c.  Consultants:   Neurology  Vascular  Surgery  Cardiology  Procedures:   Carotid stent placement  Cardiology  ROS:  Denies CP, dyspnea, N, V. Remainder 10-pt ROS is negative for all not previously mentioned.  Subjective: "Yeah, I think it's down to 2 today. They are gonna try to take it off."  Objective: Vitals:   04/18/20 0200 04/18/20 0515 04/18/20 0900 04/18/20 1240  BP: 123/68 (!) 101/59 110/71 110/68  Pulse: 49 55 55 57  Resp: 18 (!) 11 16 18   Temp:  97.7 F (36.5 C) 98.6 F (37 C) 98 F (36.7 C)  TempSrc:  Oral Oral Oral  SpO2: 93% 99% 96% 99%  Weight:      Height:        Intake/Output Summary (Last 24 hours) at 04/18/2020 1604 Last data filed at 04/18/2020 6967 Gross per 24 hour  Intake 361.7 ml  Output 1200 ml  Net -838.3 ml   Filed Weights   04/14/20 0543 04/15/20 0545 04/15/20 1352  Weight: 99.9 kg 99.4 kg 99.4 kg    Examination:  General: 64 y.o. male resting in chair in NAD Cardiovascular: RRR, +S1, S2, no m/g/r, 1/6 SEM Respiratory: CTABL, no w/r/r, normal WOB GI: BS+, NDNT, no masses noted, soft MSK: No e/c/c Neuro: A&O x 3, no focal deficits Psyc: Appropriate interaction and affect, calm/cooperative   Data Reviewed: I have personally reviewed following labs and imaging studies.  CBC: Recent Labs  Lab 04/15/20 0623 04/15/20 1650 04/16/20 0412 04/17/20 0317 04/18/20 0317  WBC 9.5 11.8* 9.6 9.2 9.4  NEUTROABS  --   --   --  6.1 5.8  HGB 14.9 14.0 13.7 14.3 14.0  HCT 45.3 43.1 41.0 43.5 42.2  MCV 96.8 98.2 97.9 96.9 97.0  PLT 423* 402* 381 394 893   Basic Metabolic Panel: Recent Labs  Lab 04/15/20 0623 04/15/20 1650 04/16/20 0412 04/17/20 0317 04/18/20 0317  NA 139  --  138 137 139  K 3.8  --  3.6 4.0 3.9  CL 109  --  111 108 107  CO2 21*  --  20* 21* 22  GLUCOSE 78  --  86 133* 87  BUN 17  --  12 7* 14  CREATININE 1.23 1.14 0.99 0.95 0.93  CALCIUM 8.5*  --  8.0* 8.5* 8.4*  MG  --   --  1.7 2.0 2.0  PHOS  --   --   --  3.0 3.5   GFR: Estimated Creatinine  Clearance: 100.2 mL/min (by C-G formula based on SCr of 0.93 mg/dL). Liver Function Tests: Recent Labs  Lab 04/17/20 0317 04/18/20 0317  ALBUMIN 2.8* 2.8*   No results for input(s): LIPASE, AMYLASE in the last 168 hours. No results for input(s): AMMONIA in the last 168 hours. Coagulation Profile: No results for input(s): INR, PROTIME in the last 168 hours. Cardiac Enzymes: No results for input(s): CKTOTAL, CKMB, CKMBINDEX, TROPONINI in the last 168 hours. BNP (last 3 results) No results for input(s): PROBNP in the last 8760 hours. HbA1C: No results for input(s): HGBA1C in the last 72 hours. CBG: No results for input(s): GLUCAP in the last 168 hours. Lipid Profile: No results for input(s): CHOL, HDL, LDLCALC, TRIG, CHOLHDL, LDLDIRECT in the last 72 hours. Thyroid Function Tests: No results for input(s): TSH, T4TOTAL, FREET4, T3FREE, THYROIDAB in the last 72 hours. Anemia Panel: No results for input(s): VITAMINB12, FOLATE, FERRITIN, TIBC, IRON, RETICCTPCT in the last 72  hours. Sepsis Labs: No results for input(s): PROCALCITON, LATICACIDVEN in the last 168 hours.  Recent Results (from the past 240 hour(s))  SARS Coronavirus 2 by RT PCR (hospital order, performed in [facility] hospital lab) Nasopharyngeal Nasopharyngeal Swab     Status: None   Collection Time: 04/09/20 11:39 PM   Specimen: Nasopharyngeal Swab  Result Value Ref Range Status   SARS Coronavirus 2 NEGATIVE NEGATIVE Final    Comment: (NOTE) SARS-CoV-2 target nucleic acids are NOT DETECTED.  The SARS-CoV-2 RNA is generally detectable in upper and lower respiratory specimens during the acute phase of infection. The lowest concentration of SARS-CoV-2 viral copies this assay can detect is 250 copies / mL. A negative result does not preclude SARS-CoV-2 infection and should not be used as the sole basis for treatment or other patient management decisions.  A negative result may occur with improper specimen collection /  handling, submission of specimen other than nasopharyngeal swab, presence of viral mutation(s) within the areas targeted by this assay, and inadequate number of viral copies (<250 copies / mL). A negative result must be combined with clinical observations, patient history, and epidemiological information.  Fact Sheet for Patients:   StrictlyIdeas.no  Fact Sheet for Healthcare Providers: BankingDealers.co.za  This test is not yet approved or  cleared by the [place] FDA and has been authorized for detection and/or diagnosis of SARS-CoV-2 by FDA under an Emergency Use Authorization (EUA).  This EUA will remain in effect (meaning this test can be used) for the duration of the COVID-19 declaration under Section 564(b)(1) of the Act, 21 U.S.C. section 360bbb-3(b)(1), unless the authorization is terminated or revoked sooner.  Performed at [facility], [place] [address] [place], [place] [address]   Surgical pcr screen     Status: None   Collection Time: 04/15/20 11:25 AM   Specimen: Nasal Mucosa; Nasal Swab  Result Value Ref Range Status   MRSA, PCR NEGATIVE NEGATIVE Final   Staphylococcus aureus NEGATIVE NEGATIVE Final    Comment: (NOTE) The Xpert SA Assay (FDA approved for NASAL specimens in patients 58 years of age and older), is one component of a comprehensive surveillance program. It is not intended to diagnose infection nor to guide or monitor treatment. Performed at [facility], [place] [address], [place], [place] [address]       Radiology Studies: No results found.   Scheduled Meds: . aspirin  81 mg Oral Daily  . atorvastatin  80 mg Oral Daily  . clopidogrel  75 mg Oral Daily  . docusate sodium  100 mg Oral Daily  . folic acid  1 mg Oral Daily  . heparin  5,000 Units Subcutaneous Q8H  . pantoprazole  40 mg Oral Daily  . predniSONE  10 mg Oral Daily  . simethicone  80 mg Oral BID  . sodium chloride  flush  3 mL Intravenous Q12H  . sodium chloride flush  3 mL Intravenous Q12H  . Tofacitinib Citrate ER  11 mg Oral Daily   Continuous Infusions: . sodium chloride    . lactated ringers 10 mL/hr at 04/15/20 1404  . magnesium sulfate bolus IVPB       LOS: 8 days    Time spent: 25 minutes spent in the coordination of care today.    Jonnie Finner, DO [facility]  If 7PM-7AM, please contact night-coverage [email or web address] 04/18/2020, 4:04 PM

## 2020-04-18 NOTE — Progress Notes (Signed)
Mobility Specialist: Progress Note      04/18/20 1227  Mobility  Activity Ambulated in hall  Level of Assistance Independent  Assistive Device None  Distance Ambulated (ft) 940 ft  Mobility Response Tolerated well  Mobility performed by Mobility specialist  $Mobility charge 1 Mobility   Pre-Mobility: 51 HR, 100/68 BP, 100% SpO2 During Mobility: 98% SpO2 Post-Mobility: 56 HR, 113/72 BP, 98% SpO2  Pt's HR spiked to 146 bpm twice during walk and once in the 130s. Pt had no c/o. Pt's BP dropped to 93/24 upon exiting room but quickly resolved and went back up to 120/69. Nurse notified.   [facility] [address] [place] Mobility Specialist

## 2020-04-18 NOTE — Progress Notes (Addendum)
Progress Note    04/18/2020 7:58 AM 3 Days Post-Op  Subjective:  Says he slept very well. No complaints this morning.   Vitals:   04/18/20 0200 04/18/20 0515  BP: 123/68 (!) 101/59  Pulse: 49 55  Resp: 18 (!) 11  Temp:  97.7 F (36.5 C)  SpO2: 93% 99%   HR range: 40-60 bpm sys BP range 100-125 1100cc UOP  Physical Exam: Neuro: A and O times 4. Tongue midline. Face symmetrical Cardiac:  RRR Lungs:  CTAB Incisions:  Right groin venipuncture without hematoma. Left neck incision without hematoma or signs of infection Extremities:  Moves all well. 5/5 hand grip strength Abdomen:  Soft, ND  CBC    Component Value Date/Time   WBC 9.4 04/18/2020 0317   RBC 4.35 04/18/2020 0317   HGB 14.0 04/18/2020 0317   HCT 42.2 04/18/2020 0317   PLT 376 04/18/2020 0317   MCV 97.0 04/18/2020 0317   MCH 32.2 04/18/2020 0317   MCHC 33.2 04/18/2020 0317   RDW 14.3 04/18/2020 0317   LYMPHSABS 2.0 04/18/2020 0317   MONOABS 1.1 (H) 04/18/2020 0317   EOSABS 0.4 04/18/2020 0317   BASOSABS 0.1 04/18/2020 0317    BMET    Component Value Date/Time   NA 139 04/18/2020 0317   K 3.9 04/18/2020 0317   CL 107 04/18/2020 0317   CO2 22 04/18/2020 0317   GLUCOSE 87 04/18/2020 0317   BUN 14 04/18/2020 0317   CREATININE 0.93 04/18/2020 0317   CALCIUM 8.4 (L) 04/18/2020 0317   GFRNONAA >60 04/18/2020 0317   GFRAA >60 04/18/2020 0317     Intake/Output Summary (Last 24 hours) at 04/18/2020 0758 Last data filed at 04/18/2020 0355 Gross per 24 hour  Intake 361.7 ml  Output 1100 ml  Net -738.3 ml    HOSPITAL MEDICATIONS Scheduled Meds: . aspirin  81 mg Oral Daily  . atorvastatin  80 mg Oral Daily  . clopidogrel  75 mg Oral Daily  . docusate sodium  100 mg Oral Daily  . folic acid  1 mg Oral Daily  . heparin  5,000 Units Subcutaneous Q8H  . pantoprazole  40 mg Oral Daily  . predniSONE  10 mg Oral Daily  . sodium chloride flush  3 mL Intravenous Q12H  . sodium chloride flush  3 mL  Intravenous Q12H  . Tofacitinib Citrate ER  11 mg Oral Daily   Continuous Infusions: . sodium chloride    . DOPamine 2 mcg/kg/min (04/17/20 1710)  . lactated ringers 10 mL/hr at 04/15/20 1404  . magnesium sulfate bolus IVPB     PRN Meds:.sodium chloride, acetaminophen **OR** acetaminophen, alum & mag hydroxide-simeth, guaiFENesin-dextromethorphan, hydrALAZINE, labetalol, magnesium sulfate bolus IVPB, meclizine, metoprolol tartrate, morphine injection, nitroGLYCERIN, ondansetron (ZOFRAN) IV, oxyCODONE-acetaminophen, phenol, potassium chloride, sodium chloride flush, traMADol, traZODone  Assessment: POD 3 left TCAR secondary to symptomatic left ICA stenosis. Neuro intact. Labs stable.  Peri-operative hypotension requiring dopamine infusion on 31mcg/kg/min.  No weakness or near-syncope when OOB.  Hemoglobin remains stable.  Plan: -Discontinue dopamine and continue to monitor BP and HR. -DVT prophylaxis: Heparin subcutaneously   Risa Grill, PA-C [facility] [phone] 04/18/2020  7:58 AM   I have seen and evaluated the patient. I agree with the PA note as documented above.  64 year old male status post left TCAR for treatment of symptomatic left ICA stenosis.  Left neck incision and right groin incision look good.  He is neuro intact.  He is only on 2 mcg  of dopamine.  Have asked nursing to wean this off and see how he does.  Needs aspirin Plavix statin at discharge.  Marty Heck, MD [facility] [place] Office: [phone]

## 2020-04-18 NOTE — Plan of Care (Signed)
  Problem: Activity: Goal: Ability to tolerate increased activity will improve Outcome: Progressing   Problem: Cardiac: Goal: Ability to achieve and maintain adequate cardiovascular perfusion will improve Outcome: Progressing   

## 2020-04-19 DIAGNOSIS — E785 Hyperlipidemia, unspecified: Secondary | ICD-10-CM

## 2020-04-19 DIAGNOSIS — I639 Cerebral infarction, unspecified: Secondary | ICD-10-CM

## 2020-04-19 MED ORDER — ATORVASTATIN CALCIUM 80 MG PO TABS: 80.0000 mg | ORAL_TABLET | Freq: Every day | ORAL | 0 refills | Status: AC

## 2020-04-19 MED ORDER — NITROGLYCERIN 0.4 MG SL SUBL: 0.4000 mg | SUBLINGUAL_TABLET | SUBLINGUAL | 0 refills | Status: AC | PRN

## 2020-04-19 MED ORDER — CLOPIDOGREL BISULFATE 75 MG PO TABS: 75.0000 mg | ORAL_TABLET | Freq: Every day | ORAL | 0 refills | Status: DC

## 2020-04-19 NOTE — Plan of Care (Signed)
  Problem: Education: Goal: Understanding of cardiac disease, CV risk reduction, and recovery process will improve Outcome: Progressing   Problem: Education: Goal: Individualized Educational Video(s) Outcome: Progressing   Problem: Education: Goal: Knowledge of disease or condition will improve Outcome: Progressing   Problem: Coping: Goal: Will verbalize positive feelings about self Outcome: Progressing   Problem: Coping: Goal: Will identify appropriate support needs Outcome: Progressing   Problem: Health Behavior/Discharge Planning: Goal: Ability to manage health-related needs will improve Outcome: Progressing

## 2020-04-19 NOTE — Progress Notes (Addendum)
Progress Note    04/19/2020 7:49 AM 4 Days Post-Op  Subjective:  Continues to do well without complaints. No DOE, SOB or near-syncope with ambulation.   Vitals:   04/19/20 0527 04/19/20 0733  BP: (!) 111/50 123/75  Pulse: 51 50  Resp: 16 17  Temp: 98.6 F (37 C) 97.6 F (36.4 C)  SpO2: 99% 100%   MAP range since dopamine off yesterday morning: 66-89 sys BP: 105-137 HR: 48-65  Physical Exam:  Neuro: A and O times 4. Tongue midline. Face symmetrical Cardiac:  RRR Lungs:  CTAB Incisions:  Right groin venipuncture without hematoma. Left neck incision without hematoma or signs of infection Extremities:  Moves all well. 5/5 hand grip strength Abdomen:  Soft, ND  CBC    Component Value Date/Time   WBC 9.4 04/18/2020 0317   RBC 4.35 04/18/2020 0317   HGB 14.0 04/18/2020 0317   HCT 42.2 04/18/2020 0317   PLT 376 04/18/2020 0317   MCV 97.0 04/18/2020 0317   MCH 32.2 04/18/2020 0317   MCHC 33.2 04/18/2020 0317   RDW 14.3 04/18/2020 0317   LYMPHSABS 2.0 04/18/2020 0317   MONOABS 1.1 (H) 04/18/2020 0317   EOSABS 0.4 04/18/2020 0317   BASOSABS 0.1 04/18/2020 0317    BMET    Component Value Date/Time   NA 139 04/18/2020 0317   K 3.9 04/18/2020 0317   CL 107 04/18/2020 0317   CO2 22 04/18/2020 0317   GLUCOSE 87 04/18/2020 0317   BUN 14 04/18/2020 0317   CREATININE 0.93 04/18/2020 0317   CALCIUM 8.4 (L) 04/18/2020 0317   GFRNONAA >60 04/18/2020 0317   GFRAA >60 04/18/2020 0317     Intake/Output Summary (Last 24 hours) at 04/19/2020 0749 Last data filed at 04/19/2020 0734 Gross per 24 hour  Intake 460 ml  Output 1600 ml  Net -1140 ml    HOSPITAL MEDICATIONS Scheduled Meds: . aspirin  81 mg Oral Daily  . atorvastatin  80 mg Oral Daily  . clopidogrel  75 mg Oral Daily  . docusate sodium  100 mg Oral Daily  . folic acid  1 mg Oral Daily  . heparin  5,000 Units Subcutaneous Q8H  . pantoprazole  40 mg Oral Daily  . predniSONE  10 mg Oral Daily  .  simethicone  80 mg Oral BID  . sodium chloride flush  3 mL Intravenous Q12H  . Tofacitinib Citrate ER  11 mg Oral Daily   Continuous Infusions: . sodium chloride    . lactated ringers 10 mL/hr at 04/15/20 1404  . magnesium sulfate bolus IVPB     PRN Meds:.sodium chloride, acetaminophen **OR** acetaminophen, alum & mag hydroxide-simeth, guaiFENesin-dextromethorphan, hydrALAZINE, labetalol, magnesium sulfate bolus IVPB, meclizine, metoprolol tartrate, morphine injection, nitroGLYCERIN, ondansetron (ZOFRAN) IV, oxyCODONE-acetaminophen, phenol, potassium chloride, sodium chloride flush, traMADol, traZODone  Assessment: POD 4 left TCAR secondary tosymptomatic left ICA stenosis. Neuro intact.  Peri-operative hypotension requiring dopamine infusion off now for approximately 24 hours.  No weakness or near-syncope when OOB.   Plan: -Stable for dc from vascular surgery perspective. Continue Plavix and aspirin. Dr. Carlis Abbott will assess this morning. Follow-up arrangements will be made through our office. -DVT prophylaxis:  Heparin [place]   Risa Grill, PA-C [facility] [phone] 04/19/2020  7:49 AM   I have seen and evaluated the patient. I agree with the PA note as documented above.  Now postop day 6 status post left TCAR for high-grade symptomatic left ICA stenosis.  All of incisions continue  to look good.  He has been off the dopamine since yesterday and blood pressures look good per documentation.  He is ready to go home.  Will need aspirin Plavix statin at discharge and will arrange follow-up in 1 month with carotid duplex in the office.  Marty Heck, MD [facility] [place] Office: [phone]

## 2020-04-19 NOTE — Progress Notes (Signed)
Pt discharged to home with friend.  Pt's IV removed.  Pt taken off telemetry and CCMD notified.  Pt left with all of their personal belongings.  AVS documentation reviewed with Pt and all questions answered.

## 2020-04-19 NOTE — Discharge Summary (Signed)
Physician Discharge Summary  Ronald Cameron EXB:[id number] DOB: [birth year] DOA: 04/09/2020  PCP: Claretta Fraise, MD  Admit date: 04/09/2020 Discharge date: 04/19/2020  Admitted From: Home Disposition:  Discharged to home.   Recommendations for Outpatient Follow-up:  1. Follow up with PCP in 1 week. 2. Please obtain BMP/CBC in one week. 3. Follow up with Vascular Surgery as scheduled.  4. Follow up with Neurology as scheduled.  Discharge Condition: Stable  CODE STATUS: FULL   Brief/Interim Summary: Patient is a 64 year old male with history of rheumatoid arthritis, chronic pain syndrome, diverticulitis who presents to the emergency department with complaints of palpitations, chest discomfort. On presentation, EKG showed inferolateral T wave inversions. CT head was negative. Acute intracranial abnormalities. CT chest/abdomen/pelvis was negative for aortic dissection or aneurysm. CTA neck was concerning of severe near occlusive atheromatous stenosis involving the left ICA. Cardiology and vascular surgery consulted. He underwent cardiac catheter with finding of nonobstructive coronary disease. Venous duplex of the carotid arteries confirmed 80 to 99% stenosis of the left ICA. Vascular surgery initially planning for stent placement as an outpatient. He was planned for discharge on 04/11/20 but he continue to remain dizzy. MRI of the brain showed 2 punctate right frontal acute infarcts. Neurology consulted. Due to suspicion of ongoing TIA, vascular surgery again notified.Plan for carotoid stenting on Wednesday.  7/25: Stable on dopamine gtt. Ok to d/c from [facility] surgery perspective. Will discharge to home. Discharge instructions reviewed with pt. He voices understanding and agreement.   Discharge Diagnoses:  Acute ischemic stroke - MRI of the brain showed 2 punctuate right frontal acute infarcts.  - This is most likely associated with post cath complication and unlikely  the cause of dizziness.  - PT/OT/speech evaluation done: OP PT, no OT  - Hemoglobin A1c of 5.7. LDL of 184.  - Continue Lipitor, aspirin, Plavix. - Carotid stenosis noted, see below - Neurology has s/o'd.  Severe carotid stenosis - CTA neck and venous duplex of the carotid artery confirmed severe stenosis of the left ICA.  - Vascular surgery was consulted.  - Continue aspirin and Plavix. - Due to ongoing neurological symptom, vascular surgery placed stent; appreciate assistance -s/p carotid stent placement. ASA, plavix.     - 7/25: Per Vasc Surg: " He has been off the dopamine since yesterday and blood pressures look good per documentation.  He is ready to go home.  Will need aspirin Plavix statin at discharge and will arrange follow-up in 1 month with carotid duplex in the office."  Dizziness - Complained of intermittent dizziness with left blurry vision while ambulating.  - Continue meclizine for symptomatic relief for now. 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 Vasc surgery following; continue dopamine     - 7/25: Per Vasc Surg: " He has been off the dopamine since yesterday and blood pressures look good per documentation.  He is ready to go home.  Will need aspirin Plavix statin at discharge and will arrange follow-up in 1 month with carotid duplex in the office."  Chest pain/palpitations - Presented with 1 week history of intermittent palpitations, chest discomfort.  - EKG showed T wave inversions.  - Troponins were normal.  - No acute pathology as per CT chest abdomen/pelvis.  - Cardiology was consulted and he underwent cardiac cath which showed proximal RCA lesion with 60% stenosis but nonobstructive.  - Echo showed ejection fraction of 55 to 60%, mild left ventricular  hypertrophy, grade 1  diastolic dysfunction, no regional wall motion abnormalities.  - Cardiology has signed off; appreciate assistance  Hypokalemia -resolved.  Rheumatoid arthritis  - Stable. Follow-up outpatient.  Lisette Grinder, prednisone and methotrexate  Hyperlipidemia - LDL of 184. - continue lipitor  Discharge Instructions  Discharge Instructions    Ambulatory referral to Neurology   Complete by: As directed    Follow up in stroke clinic at [facility] with Frann Rider, NP in about 4 weeks. If not available, consider Dr. Antony Contras, Dr. Bess Harvest, or Dr. Sarina Ill.     Allergies as of 04/19/2020   No Known Allergies     Medication List    STOP taking these medications   tamsulosin 0.4 MG Caps capsule Commonly known as: FLOMAX     TAKE these medications   alendronate 70 MG tablet Commonly known as: FOSAMAX Take 70 mg by mouth once a week.   aspirin 81 MG tablet Take 81 mg by mouth daily.   atorvastatin 80 MG tablet Commonly known as: LIPITOR Take 1 tablet (80 mg total) by mouth daily.   Calcium-Vitamin D 600-400 MG-UNIT Tabs Take 1 tablet by mouth daily. 1200mg  daily   clopidogrel 75 MG tablet Commonly known as: PLAVIX Take 1 tablet (75 mg total) by mouth daily.   folic acid 1 MG tablet Commonly known as: FOLVITE Take 1 mg by mouth daily.   methotrexate (PF) 50 MG/2ML injection Inject 12.5 mg into the muscle once a week.   nitroGLYCERIN 0.4 MG SL tablet Commonly known as: NITROSTAT Place 1 tablet (0.4 mg total) under the tongue every 5 (five) minutes x 3 doses as needed for up to 30 doses for chest pain.   predniSONE 10 MG tablet Commonly known as: DELTASONE Take 10 mg by mouth daily.   traMADol 50 MG tablet Commonly known as: ULTRAM Take 1 tablet (50 mg total) by mouth every 6 (six) hours as needed. What changed:   how much to take  reasons to take this   traZODone 50 MG tablet Commonly known  as: DESYREL Take 50 mg by mouth at bedtime as needed for sleep.   vitamin C 1000 MG tablet Take 1,000 mg by mouth daily.   Xeljanz XR 11 MG Tb24 Generic drug: Tofacitinib Citrate ER Take 11 mg by mouth daily.       Follow-up Information    Branch, Alphonse Guild, MD Follow up.   Specialty: Cardiology Why: office scheduler will contact you to arrange 3 weeks follow up, please give [place] a call if you do not hear from our scheduler in 3 business days after discharge.  Contact information: [address] [place] [address] [phone]        Elam Dutch, MD In 4 weeks.   Specialties: Vascular Surgery, Cardiology Why: The office will call the patient with an appointment(sent) Contact information: [address] [place] [address] [phone]        [facility] Follow up in 4 week(s).   Specialty: Neurology Why: stroke clinic. office will call with appt date and time.  Contact information: [address] [place] [phone]             No Known Allergies  Consultations:  Neurology  Vascular Surgery  Cardiology  Procedures/Studies: DG Chest 2 View  Result Date: 04/09/2020 CLINICAL DATA:  Chest pain and dizziness. EXAM: CHEST - 2 VIEW COMPARISON:  Remote exam 10/18/2003 FINDINGS: Stable  upper normal heart size.The cardiomediastinal contours are normal. Mild bronchial thickening. Pulmonary vasculature is normal. No consolidation, pleural effusion, or pneumothorax. No acute osseous abnormalities are seen. IMPRESSION: Mild bronchial thickening. Electronically Signed   By: Keith Rake M.D.   On: 04/09/2020 16:05   CT Head Wo Contrast  Result Date: 04/09/2020 CLINICAL DATA:  Headache and dizziness. EXAM: CT HEAD WITHOUT CONTRAST TECHNIQUE: Contiguous axial images were obtained from the base of the skull through the vertex without intravenous contrast. COMPARISON:  12/04/2016 FINDINGS: Brain: No  intracranial hemorrhage, mass effect, or midline shift. No hydrocephalus. Brain volume is normal for age. The basilar cisterns are patent. No evidence of territorial infarct or acute ischemia. No extra-axial or intracranial fluid collection. Vascular: Atherosclerosis of skullbase vasculature without hyperdense vessel or abnormal calcification. Skull: No fracture or focal lesion. Sinuses/Orbits: Paranasal sinuses and mastoid air cells are clear. The visualized orbits are unremarkable. Bilateral lens extraction. Other: None. IMPRESSION: No acute intracranial abnormality. Electronically Signed   By: Keith Rake M.D.   On: 04/09/2020 21:47   CT Angio Neck W and/or Wo Contrast  Result Date: 04/09/2020 CLINICAL DATA:  Initial evaluation for acute headache, dizziness. EXAM: CT ANGIOGRAPHY NECK TECHNIQUE: Multidetector CT imaging of the neck was performed using the standard protocol during bolus administration of intravenous contrast. Multiplanar CT image reconstructions and MIPs were obtained to evaluate the vascular anatomy. Carotid stenosis measurements (when applicable) are obtained utilizing NASCET criteria, using the distal internal carotid diameter as the denominator. CONTRAST:  56mL OMNIPAQUE IOHEXOL 350 MG/ML SOLN COMPARISON:  Prior head CT from earlier the same day. FINDINGS: Aortic arch: Visualized aortic arch of normal caliber with normal branch pattern. Mild atheromatous change within the arch itself. No high-grade stenosis about the origin of the great vessels. Right carotid system: Mild scattered nonstenotic plaque present within the right common carotid artery without significant stenosis. Bulky mixed plaque about the right bifurcation/proximal right ICA with associated stenosis of up to 35% by NASCET criteria. Right ICA otherwise patent without stenosis, dissection or occlusion. Left carotid system: Eccentric soft plaque seen throughout the left CCA without high-grade stenosis. Extensive  predominantly soft plaque about the left bifurcation/proximal left ICA with associated severe near occlusive stenosis (series 7, image 122). A radiographic string sign is present. Area of involvement begins at the left bifurcation and measures approximately 19 mm in length. Distally, left ICA patent without stenosis, dissection, or occlusion. Vertebral arteries: Both vertebral arteries arise from the subclavian arteries. Short-segment stenosis of approximately 65% noted within the proximal left subclavian artery prior to the takeoff of the left vertebral artery (series 8, image 113). Atheromatous plaque at the origin of the left vertebral artery with estimated 50% stenosis. Mild plaque at the origin of the right vertebral artery without significant stenosis. Right vertebral artery is dominant. Additional scattered calcified plaque within the proximal V1/V2 segments bilaterally without high-grade stenosis. Vertebral arteries otherwise patent without stenosis, dissection, or occlusion. Skeleton: No acute osseous abnormality. No discrete or worrisome osseous lesions. Other neck: No other acute soft tissue abnormality within the neck. No mass lesion or adenopathy. Few subcentimeter hypodense thyroid nodules measuring up to 4 mm noted, felt to be of doubtful significance given size and patient age. No follow-up imaging recommended regarding these lesions. Upper chest: Chronic fibrotic changes noted within the visualized lungs, better evaluated on concomitant chest CT. IMPRESSION: 1. Negative CTA for acute dissection or other abnormality. 2. 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 Vascular  surgery consultation and referral recommended for further evaluation and consideration of possible CEA. 3. Bulky mixed plaque about the right carotid bifurcation/proximal right ICA with associated stenosis of up to 35% by NASCET criteria. 4. Short-segment 65% atheromatous stenosis within the  proximal left subclavian artery. 5. 50% atheromatous stenosis at the origin of the left vertebral artery. Right vertebral artery is dominant. Electronically Signed   By: Jeannine Boga M.D.   On: 04/09/2020 23:01   MR BRAIN WO CONTRAST  Result Date: 04/11/2020 CLINICAL DATA:  Dizziness EXAM: MRI HEAD WITHOUT CONTRAST TECHNIQUE: Multiplanar, multiecho pulse sequences of the brain and surrounding structures were obtained without intravenous contrast. COMPARISON:  None. FINDINGS: Brain: Two punctate nearby foci of diffusion hyperintensity at the posterior aspect of the right superior frontal gyrus. Small size limits evaluation on ADC. There is no evidence of intracranial hemorrhage. Patchy T2 hyperintensity in the supratentorial white matter is nonspecific but may reflect minor chronic microvascular ischemic changes. Ventricles and sulci are within normal limits in size and configuration. There is no intracranial mass or mass effect. There is no hydrocephalus or extra-axial fluid collection. Vascular: Major vessel flow voids at the skull base are preserved. Skull and upper cervical spine: Normal marrow signal is preserved. Sinuses/Orbits: Trace paranasal sinus mucosal thickening. Bilateral lens replacements. Other: Sella is unremarkable.  Mastoid air cells are clear. IMPRESSION: Two punctate right frontal acute infarcts. Electronically Signed   By: Macy Mis M.D.   On: 04/11/2020 16:18   CARDIAC CATHETERIZATION  Result Date: 04/10/2020  Prox RCA lesion is 65% stenosed.  LV end diastolic pressure is mildly elevated.  1. Nonobstructive CAD. The mid RCA has an eccentric stenosis. In some view it only appears 40% narrowed but in other views it appears to be 70%. DFR is normal indicating it is not flow limiting. 2. Mildly elevated LVEDP 18 mm Hg Plan: medical management.   ECHOCARDIOGRAM COMPLETE  Result Date: 04/10/2020    ECHOCARDIOGRAM REPORT   Patient Name:   Ronald Cameron Date of Exam:  04/10/2020 Medical Rec #:  [id number]            Height:       71.0 in Accession #:    [id number]           Weight:       221.0 lb Date of Birth:  1955-12-14           BSA:          2.200 m Patient Age:    81 years             BP:           125/112 mmHg Patient Gender: M                    HR:           59 bpm. Exam Location:  Inpatient Procedure: 2D Echo, Cardiac Doppler and Color Doppler Indications:    CAD Native Vessel                 Chest pain  History:        Patient has prior history of Echocardiogram examinations, most                 recent 04/03/2015. Signs/Symptoms:Chest Pain; Risk Factors:Former                 Smoker. Carotid stenosis.  Sonographer:    Clayton Lefort RDCS (AE) Referring Phys:  [id number] SEYED A SHAHMEHDI  Sonographer Comments: Suboptimal subcostal window and patient is morbidly obese. Dr. Jenkins Rouge at bedside during echo. IMPRESSIONS  1. Left ventricular ejection fraction, by estimation, is 55 to 60%. The left ventricle has normal function. The left ventricle has no regional wall motion abnormalities. There is mild left ventricular hypertrophy. Left ventricular diastolic parameters are consistent with Grade I diastolic dysfunction (impaired relaxation).  2. Right ventricular systolic function is normal. The right ventricular size is normal.  3. The mitral valve is normal in structure. No evidence of mitral valve regurgitation. No evidence of mitral stenosis.  4. The aortic valve is normal in structure. Aortic valve regurgitation is mild to moderate. Mild aortic valve sclerosis is present, with no evidence of aortic valve stenosis.  5. The inferior vena cava is normal in size with greater than 50% respiratory variability, suggesting right atrial pressure of 3 mmHg. FINDINGS  Left Ventricle: Left ventricular ejection fraction, by estimation, is 55 to 60%. The left ventricle has normal function. The left ventricle has no regional wall motion abnormalities. The left ventricular internal cavity  size was normal in size. There is  mild left ventricular hypertrophy. Left ventricular diastolic parameters are consistent with Grade I diastolic dysfunction (impaired relaxation). Right Ventricle: The right ventricular size is normal. No increase in right ventricular wall thickness. Right ventricular systolic function is normal. Left Atrium: Left atrial size was normal in size. Right Atrium: Right atrial size was normal in size. Pericardium: There is no evidence of pericardial effusion. Mitral Valve: The mitral valve is normal in structure. Normal mobility of the mitral valve leaflets. Mild mitral annular calcification. No evidence of mitral valve regurgitation. No evidence of mitral valve stenosis. Tricuspid Valve: The tricuspid valve is normal in structure. Tricuspid valve regurgitation is not demonstrated. No evidence of tricuspid stenosis. Aortic Valve: The aortic valve is normal in structure. Aortic valve regurgitation is mild to moderate. Aortic regurgitation PHT measures 789 msec. Mild aortic valve sclerosis is present, with no evidence of aortic valve stenosis. Aortic valve mean gradient measures 5.0 mmHg. Aortic valve peak gradient measures 9.7 mmHg. Aortic valve area, by VTI measures 2.04 cm. Pulmonic Valve: The pulmonic valve was normal in structure. Pulmonic valve regurgitation is not visualized. No evidence of pulmonic stenosis. Aorta: The aortic root is normal in size and structure. Venous: The inferior vena cava is normal in size with greater than 50% respiratory variability, suggesting right atrial pressure of 3 mmHg. IAS/Shunts: No atrial level shunt detected by color flow Doppler.  LEFT VENTRICLE PLAX 2D LVIDd:         4.90 cm  Diastology LVIDs:         3.40 cm  LV e' lateral:   8.49 cm/s LV PW:         1.40 cm  LV E/e' lateral: 7.6 LV IVS:        1.40 cm  LV e' medial:    6.64 cm/s LVOT diam:     2.10 cm  LV E/e' medial:  9.7 LV SV:         74 LV SV Index:   34 LVOT Area:     3.46 cm  RIGHT  VENTRICLE RV Basal diam:  3.10 cm TAPSE (M-mode): 1.3 cm LEFT ATRIUM           Index       RIGHT ATRIUM           Index LA diam:      3.10  cm 1.41 cm/m  RA Area:     12.20 cm LA Vol (A2C): 44.0 ml 20.00 ml/m RA Volume:   26.80 ml  12.18 ml/m LA Vol (A4C): 58.4 ml 26.55 ml/m  AORTIC VALVE AV Area (Vmax):    2.09 cm AV Area (Vmean):   1.95 cm AV Area (VTI):     2.04 cm AV Vmax:           156.00 cm/s AV Vmean:          103.000 cm/s AV VTI:            0.363 m AV Peak Grad:      9.7 mmHg AV Mean Grad:      5.0 mmHg LVOT Vmax:         94.00 cm/s LVOT Vmean:        58.100 cm/s LVOT VTI:          0.214 m LVOT/AV VTI ratio: 0.59 AI PHT:            789 msec  AORTA Ao Root diam: 3.50 cm Ao Asc diam:  3.40 cm MITRAL VALVE MV Area (PHT): 2.24 cm    SHUNTS MV Decel Time: 338 msec    Systemic VTI:  0.21 m MV E velocity: 64.70 cm/s  Systemic Diam: 2.10 cm MV A velocity: 85.70 cm/s MV E/A ratio:  0.75 Candee Furbish MD Electronically signed by Candee Furbish MD Signature Date/Time: 04/10/2020/11:43:36 AM    Final    CT Angio Chest/Abd/Pel for Dissection W and/or Wo Contrast  Result Date: 04/09/2020 CLINICAL DATA:  64 year old male with chest and back pain. Dizziness. EXAM: CT ANGIOGRAPHY CHEST, ABDOMEN AND PELVIS TECHNIQUE: Multidetector CT imaging through the chest, abdomen and pelvis was performed using the standard protocol during bolus administration of intravenous contrast. Multiplanar reconstructed images and MIPs were obtained and reviewed to evaluate the vascular anatomy. CONTRAST:  38mL OMNIPAQUE IOHEXOL 350 MG/ML SOLN COMPARISON:  Noncontrast CT Abdomen and Pelvis 02/04/2018. CTA abdomen and pelvis 09/02/2017. Thoracic spine CT 10/19/2003 FINDINGS: CTA CHEST FINDINGS Cardiovascular: Calcified coronary artery atherosclerosis. Comparatively mild calcified plaque of the thoracic aorta. Negative for thoracic aortic dissection or aneurysm. Mild central pulmonary artery enlargement. Bilateral central pulmonary arteries  also appear to be patent. Mild cardiomegaly. No pericardial effusion. Mediastinum/Nodes: Negative.  No lymphadenopathy. Lungs/Pleura: Major airways are patent. Larger lung volumes compared to 2018. Scattered mild bilateral subpleural reticular and/or ground-glass opacity which has a chronic postinflammatory appearance. No pleural effusion. There is a stable small right lower lobe lung nodule since 09/02/2017 (benign) on series 5, image 45. Musculoskeletal: Chronic T5 compression fracture demonstrated in 2005. Stable mild T11 endplate irregularity since 2018. No acute osseous abnormality identified. Review of the MIP images confirms the above findings. CTA ABDOMEN AND PELVIS FINDINGS VASCULAR From the renal arteries distally there is extensive Aortoiliac calcified atherosclerosis. But the major arterial structures remain patent in the abdomen and pelvis. Negative for aneurysm or dissection. The celiac origin and SMA origin remain normal. No mesenteric atherosclerosis identified. The IMA is patent. Review of the MIP images confirms the above findings. NON-VASCULAR Hepatobiliary: Negative liver and gallbladder. Pancreas: Negative aside from some fatty pancreatic atrophy. Spleen: Negative. Adrenals/Urinary Tract: Normal adrenal glands. Bilateral renal enhancement is symmetric and normal. No hydronephrosis. Normal ureters to the bladder. Unremarkable urinary bladder. Renal vascular calcification suspected, nephrolithiasis felt less likely. Stomach/Bowel: Diverticulosis of the sigmoid colon with no active inflammation. Occasional descending colon diverticula without inflammation. Normal retrocecal appendix (series 7, image 184).  No large bowel inflammation. Negative terminal ileum. No dilated small bowel. Decompressed stomach. Negative duodenum. No free air, free fluid, or mesenteric stranding. Lymphatic: No lymphadenopathy. Reproductive: Small chronic fat containing left inguinal hernia, otherwise negative. Other: No  pelvic free fluid. Musculoskeletal: Transitional lumbosacral anatomy with sacralized L5 level. No acute osseous abnormality identified. Review of the MIP images confirms the above findings. IMPRESSION: 1. Negative for aortic dissection or aneurysm. Positive for Aortic Atherosclerosis (ICD10-I70.0), from the renal artery level distally. And positive for calcified coronary artery atherosclerosis. Major arterial structures remain patent. 2. No acute or inflammatory process identified in the chest, abdomen, or pelvis. 3. Mild chronic lung disease. Diverticulosis of the sigmoid colon. Questionable punctate nephrolithiasis. Chronic T5 compression fracture. Electronically Signed   By: Genevie Ann M.D.   On: 04/09/2020 22:10   VAS US CAROTID  Result Date: 04/11/2020 Carotid Arterial Duplex Study Indications:                           Near-occlusive left ICA stenosis seen on                                        CTA. Comparison Study:                      04/03/2015- carotid artery duplex within                                        normal limits Pre-Surgical Evaluation & Surgical     Stenosis at left ICA only. ICA is normal Correlation                            past the stenosis. Anatomy on the left                                        is within normal limits.Left bifurcation                                        is located near the [place]. Performing Technologist: Maudry Mayhew MHA, RDMS, RVT, RDCS  Examination Guidelines: A complete evaluation includes B-mode imaging, spectral Doppler, color Doppler, and power Doppler as needed of all accessible portions of each vessel. Bilateral testing is considered an integral part of a complete examination. Limited examinations for reoccurring indications may be performed as noted.  Left Carotid Findings: +----------+-------+-------+--------+---------------------------------+--------+           PSV    EDV    StenosisPlaque Description               Comments            cm/s   cm/s                                                     +----------+-------+-------+--------+---------------------------------+--------+ CCA Prox  99  6                                                        +----------+-------+-------+--------+---------------------------------+--------+ CCA Distal41     4                                                        +----------+-------+-------+--------+---------------------------------+--------+ ICA Prox  456    125    80-99%  irregular, heterogenous and                                               calcific                                  +----------+-------+-------+--------+---------------------------------+--------+ ICA Distal35     11                                                       +----------+-------+-------+--------+---------------------------------+--------+ ECA       386    31                                                       +----------+-------+-------+--------+---------------------------------+--------+ +---------+--------+--+--------+--+--------+ VertebralPSV cm/s43EDV cm/s14Atypical +---------+--------+--+--------+--+--------+   Summary:  Left Carotid: 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               The ECA appears >50% stenosed. Vertebrals: Left vertebral artery demonstrates antegrade flow. *See table(s) above for measurements and observations.  Electronically signed by Antony Contras MD on 04/11/2020 at 12:07:40 PM.   Final    Structural Heart Procedure  Result Date: 04/15/2020 See surgical note for result.  HYBRID OR IMAGING ([facility] ONLY)  Result Date: 04/15/2020 There is no interpretation for this exam.  This order is for images obtained during a surgical procedure.  Please See "Surgeries" Tab for more information regarding the procedure.     Subjective: "They said you'd be the one."  Discharge Exam: Vitals:   04/18/20 2323 04/19/20  0527  BP: (!) 115/50 (!) 111/50  Pulse: 48 51  Resp: 15 16  Temp: 98.4 F (36.9 C) 98.6 F (37 C)  SpO2: 97% 99%   Vitals:   04/18/20 1930 04/18/20 2138 04/18/20 2323 04/19/20 0527  BP: 105/73 105/73 (!) 115/50 (!) 111/50  Pulse: 65 65 48 51  Resp: 18 18 15 16   Temp:  98.1 F (36.7 C) 98.4 F (36.9 C) 98.6 F (37 C)  TempSrc:   Oral Oral  SpO2: 92% 92% 97% 99%  Weight:      Height:        General: 65 y.o. male resting  in chair in NAD Cardiovascular: RRR, +S1, S2, no m/g/r Respiratory: CTABL, no w/r/r, normal WOB GI: BS+, NDNT, no masses noted, no organomegaly noted MSK: No e/c/c Neuro: A&O x 3, no focal deficits Psyc: Appropriate interaction and affect, calm/cooperative   The results of significant diagnostics from this hospitalization (including imaging, microbiology, ancillary and laboratory) are listed below for reference.     Microbiology: Recent Results (from the past 240 hour(s))  SARS Coronavirus 2 by RT PCR (hospital order, performed in [facility] hospital lab) Nasopharyngeal Nasopharyngeal Swab     Status: None   Collection Time: 04/09/20 11:39 PM   Specimen: Nasopharyngeal Swab  Result Value Ref Range Status   SARS Coronavirus 2 NEGATIVE NEGATIVE Final    Comment: (NOTE) SARS-CoV-2 target nucleic acids are NOT DETECTED.  The SARS-CoV-2 RNA is generally detectable in upper and lower respiratory specimens during the acute phase of infection. The lowest concentration of SARS-CoV-2 viral copies this assay can detect is 250 copies / mL. A negative result does not preclude SARS-CoV-2 infection and should not be used as the sole basis for treatment or other patient management decisions.  A negative result may occur with improper specimen collection / handling, submission of specimen other than nasopharyngeal swab, presence of viral mutation(s) within the areas targeted by this assay, and inadequate number of viral copies (<250 copies / mL). A negative result  must be combined with clinical observations, patient history, and epidemiological information.  Fact Sheet for Patients:   StrictlyIdeas.no  Fact Sheet for Healthcare Providers: BankingDealers.co.za  This test is not yet approved or  cleared by the [place] FDA and has been authorized for detection and/or diagnosis of SARS-CoV-2 by FDA under an Emergency Use Authorization (EUA).  This EUA will remain in effect (meaning this test can be used) for the duration of the COVID-19 declaration under Section 564(b)(1) of the Act, 21 U.S.C. section 360bbb-3(b)(1), unless the authorization is terminated or revoked sooner.  Performed at [facility], [place] [address], [place], [place] [address]   Surgical pcr screen     Status: None   Collection Time: 04/15/20 11:25 AM   Specimen: Nasal Mucosa; Nasal Swab  Result Value Ref Range Status   MRSA, PCR NEGATIVE NEGATIVE Final   Staphylococcus aureus NEGATIVE NEGATIVE Final    Comment: (NOTE) The Xpert SA Assay (FDA approved for NASAL specimens in patients 41 years of age and older), is one component of a comprehensive surveillance program. It is not intended to diagnose infection nor to guide or monitor treatment. Performed at [facility], [place] [address] [place], [place] [address]      Labs: BNP (last 3 results) No results for input(s): BNP in the last 8760 hours. Basic Metabolic Panel: Recent Labs  Lab 04/15/20 0623 04/15/20 1650 04/16/20 0412 04/17/20 0317 04/18/20 0317  NA 139  --  138 137 139  K 3.8  --  3.6 4.0 3.9  CL 109  --  111 108 107  CO2 21*  --  20* 21* 22  GLUCOSE 78  --  86 133* 87  BUN 17  --  12 7* 14  CREATININE 1.23 1.14 0.99 0.95 0.93  CALCIUM 8.5*  --  8.0* 8.5* 8.4*  MG  --   --  1.7 2.0 2.0  PHOS  --   --   --  3.0 3.5   Liver Function Tests: Recent Labs  Lab 04/17/20 0317 04/18/20 0317  ALBUMIN 2.8* 2.8*  No results for input(s):  LIPASE, AMYLASE in the last 168 hours. No results for input(s): AMMONIA in the last 168 hours. CBC: Recent Labs  Lab 04/15/20 0623 04/15/20 1650 04/16/20 0412 04/17/20 0317 04/18/20 0317  WBC 9.5 11.8* 9.6 9.2 9.4  NEUTROABS  --   --   --  6.1 5.8  HGB 14.9 14.0 13.7 14.3 14.0  HCT 45.3 43.1 41.0 43.5 42.2  MCV 96.8 98.2 97.9 96.9 97.0  PLT 423* 402* 381 394 376   Cardiac Enzymes: No results for input(s): CKTOTAL, CKMB, CKMBINDEX, TROPONINI in the last 168 hours. BNP: Invalid input(s): POCBNP CBG: No results for input(s): GLUCAP in the last 168 hours. D-Dimer No results for input(s): DDIMER in the last 72 hours. Hgb A1c No results for input(s): HGBA1C in the last 72 hours. Lipid Profile No results for input(s): CHOL, HDL, LDLCALC, TRIG, CHOLHDL, LDLDIRECT in the last 72 hours. Thyroid function studies No results for input(s): TSH, T4TOTAL, T3FREE, THYROIDAB in the last 72 hours.  Invalid input(s): FREET3 Anemia work up No results for input(s): VITAMINB12, FOLATE, FERRITIN, TIBC, IRON, RETICCTPCT in the last 72 hours. Urinalysis    Component Value Date/Time   COLORURINE YELLOW 02/03/2018 2151   APPEARANCEUR CLEAR 02/03/2018 2151   LABSPEC 1.027 02/03/2018 2151   PHURINE 6.0 02/03/2018 2151   GLUCOSEU NEGATIVE 02/03/2018 2151   HGBUR MODERATE (A) 02/03/2018 2151   [place] NEGATIVE 02/03/2018 2151   KETONESUR 5 (A) 02/03/2018 2151   PROTEINUR 30 (A) 02/03/2018 2151   NITRITE NEGATIVE 02/03/2018 2151   LEUKOCYTESUR NEGATIVE 02/03/2018 2151   Sepsis Labs Invalid input(s): PROCALCITONIN,  WBC,  LACTICIDVEN Microbiology Recent Results (from the past 240 hour(s))  SARS Coronavirus 2 by RT PCR (hospital order, performed in [facility] hospital lab) Nasopharyngeal Nasopharyngeal Swab     Status: None   Collection Time: 04/09/20 11:39 PM   Specimen: Nasopharyngeal Swab  Result Value Ref Range Status   SARS Coronavirus 2 NEGATIVE NEGATIVE Final    Comment:  (NOTE) SARS-CoV-2 target nucleic acids are NOT DETECTED.  The SARS-CoV-2 RNA is generally detectable in upper and lower respiratory specimens during the acute phase of infection. The lowest concentration of SARS-CoV-2 viral copies this assay can detect is 250 copies / mL. A negative result does not preclude SARS-CoV-2 infection and should not be used as the sole basis for treatment or other patient management decisions.  A negative result may occur with improper specimen collection / handling, submission of specimen other than nasopharyngeal swab, presence of viral mutation(s) within the areas targeted by this assay, and inadequate number of viral copies (<250 copies / mL). A negative result must be combined with clinical observations, patient history, and epidemiological information.  Fact Sheet for Patients:   StrictlyIdeas.no  Fact Sheet for Healthcare Providers: BankingDealers.co.za  This test is not yet approved or  cleared by the [place] FDA and has been authorized for detection and/or diagnosis of SARS-CoV-2 by FDA under an Emergency Use Authorization (EUA).  This EUA will remain in effect (meaning this test can be used) for the duration of the COVID-19 declaration under Section 564(b)(1) of the Act, 21 U.S.C. section 360bbb-3(b)(1), unless the authorization is terminated or revoked sooner.  Performed at [facility], [place] [address] [place], [place] [address]   Surgical pcr screen     Status: None   Collection Time: 04/15/20 11:25 AM   Specimen: Nasal Mucosa; Nasal Swab  Result Value Ref Range Status   MRSA,  PCR NEGATIVE NEGATIVE Final   Staphylococcus aureus NEGATIVE NEGATIVE Final    Comment: (NOTE) The Xpert SA Assay (FDA approved for NASAL specimens in patients 68 years of age and older), is one component of a comprehensive surveillance program. It is not intended to diagnose infection nor to guide or  monitor treatment. Performed at [facility], [place] [address], [place], [place] [address]      Time coordinating discharge: 35 minutes  SIGNED:   Jonnie Finner, DO  [facility] 04/19/2020, 7:10 AM   If 7PM-7AM, please contact night-coverage [email or web address]

## 2020-04-20 ENCOUNTER — Telehealth: Payer: Self-pay | Admitting: Family Medicine

## 2020-04-20 NOTE — Telephone Encounter (Signed)
Called patient and r/s.

## 2020-04-20 NOTE — Telephone Encounter (Signed)
TRANSITIONAL CARE MANAGEMENT TELEPHONE OUTREACH NOTE   Contact Date: 04/20/2020 Contacted By: [place] INFORMATION Date of Discharge:04/19/2020 Discharge Facility: [facility]  Principal Discharge Diagnosis:chest pain    Outpatient Follow Up Recommendations (copied from discharge summary) Recommendations for Outpatient Follow-up:  1. Follow up with PCP in 1 week. 2. Please obtain BMP/CBC in one week. 3. Follow up with Vascular Surgery as scheduled.  4. Follow up with Neurology as scheduled  Ronald Cameron is a male primary care patient of Stacks, Cletus Gash, MD. An outgoing telephone call was made today and I spoke with Ronald Cameron.  Ronald Cameron condition(s) and treatment(s) were discussed. An opportunity to ask questions was provided and all were answered or forwarded as appropriate.    ACTIVITIES OF DAILY LIVING  Ronald Cameron lives with their spouse and he can perform ADLs independently. his primary caregiver is his self. he is able to depend on his primary caregiver(s) for consistent help. Transportation to appointments, to pick up medications, and to run errands is not a problem.  (Consider referral to [facility] if transportation or a consistent caregiver is a problem)   Fall Risk Fall Risk  12/18/2019 02/13/2018  Falls in the past year? 0 No    low Crane Modifications/Assistive Devices Wheelchair: No Cane: No Ramp: No Bedside Toilet: No Hospital Bed:  No Other: walker if needed   [place] he is not receiving home health not services.     MEDICATION RECONCILIATION  Ronald Cameron has been able to pick-up all prescribed discharge medications from the pharmacy.   A post discharge medication reconciliation was performed and the complete medication list was reviewed with the patient/caregiver and is current as of 04/20/2020. Changes highlighted below.  Discontinued Medications   Current Medication List Allergies as  of 04/20/2020   No Known Allergies     Medication List       Accurate as of April 20, 2020 11:28 AM. If you have any questions, ask your nurse or doctor.        alendronate 70 MG tablet Commonly known as: FOSAMAX Take 70 mg by mouth once a week.   aspirin 81 MG tablet Take 81 mg by mouth daily.   atorvastatin 80 MG tablet Commonly known as: LIPITOR Take 1 tablet (80 mg total) by mouth daily.   Calcium-Vitamin D 600-400 MG-UNIT Tabs Take 1 tablet by mouth daily. 1200mg  daily   clopidogrel 75 MG tablet Commonly known as: PLAVIX Take 1 tablet (75 mg total) by mouth daily.   folic acid 1 MG tablet Commonly known as: FOLVITE Take 1 mg by mouth daily.   methotrexate (PF) 50 MG/2ML injection Inject 12.5 mg into the muscle once a week.   nitroGLYCERIN 0.4 MG SL tablet Commonly known as: NITROSTAT Place 1 tablet (0.4 mg total) under the tongue every 5 (five) minutes x 3 doses as needed for up to 30 doses for chest pain.   predniSONE 10 MG tablet Commonly known as: DELTASONE Take 10 mg by mouth daily.   traMADol 50 MG tablet Commonly known as: ULTRAM Take 1 tablet (50 mg total) by mouth every 6 (six) hours as needed. What changed:   how much to take  reasons to take this   traZODone 50 MG tablet Commonly known as: DESYREL Take 50 mg by mouth at bedtime as needed for sleep.   vitamin C 1000 MG tablet Take 1,000 mg by mouth daily.  Xeljanz XR 11 MG Tb24 Generic drug: Tofacitinib Citrate ER Take 11 mg by mouth daily.        PATIENT EDUCATION & FOLLOW-UP PLAN  An appointment for Transitional Care Management is scheduled with Ronald Sow FNP on 04/23/2020 at 2:10.  Take all medications as prescribed  Contact our office by calling [phone] if you have any questions or concerns

## 2020-04-22 ENCOUNTER — Encounter: Payer: Self-pay | Admitting: Family Medicine

## 2020-04-22 ENCOUNTER — Other Ambulatory Visit: Payer: Self-pay

## 2020-04-22 ENCOUNTER — Ambulatory Visit (INDEPENDENT_AMBULATORY_CARE_PROVIDER_SITE_OTHER): Payer: PPO | Admitting: Family Medicine

## 2020-04-22 VITALS — BP 122/76 | HR 82 | Temp 97.1°F | Ht 71.0 in | Wt 225.4 lb

## 2020-04-22 DIAGNOSIS — I519 Heart disease, unspecified: Secondary | ICD-10-CM

## 2020-04-22 DIAGNOSIS — I5189 Other ill-defined heart diseases: Secondary | ICD-10-CM

## 2020-04-22 DIAGNOSIS — Z9889 Other specified postprocedural states: Secondary | ICD-10-CM | POA: Diagnosis not present

## 2020-04-22 DIAGNOSIS — R42 Dizziness and giddiness: Secondary | ICD-10-CM | POA: Diagnosis not present

## 2020-04-22 DIAGNOSIS — Z8673 Personal history of transient ischemic attack (TIA), and cerebral infarction without residual deficits: Secondary | ICD-10-CM | POA: Diagnosis not present

## 2020-04-22 DIAGNOSIS — Z95828 Presence of other vascular implants and grafts: Secondary | ICD-10-CM | POA: Diagnosis not present

## 2020-04-22 NOTE — Progress Notes (Signed)
Assessment & Plan:  1. History of left common carotid artery stent placement Doing well.  Patient to follow-up with vascular surgery.  2. Grade I diastolic dysfunction Doing well.  Patient to follow-up with cardiology.  3. History of ischemic stroke Doing well.  Patient to follow-up with neurology. - BMP8+EGFR - CBC with Differential/Platelet  4. Dizziness Resolved.   Return if symptoms worsen or fail to improve.  Hendricks Limes, MSN, APRN, FNP-C [facility]  Subjective:    Patient ID: Ronald Cameron, male    DOB: [birth year], 64 y.o.   MRN: [id number]  Patient Care Team: Sharion Balloon, FNP as PCP - General (Family Medicine) [name] [name], MD as Consulting Physician (Gastroenterology)   Chief Complaint:  Chief Complaint  Patient presents with  . Transitions Of Care    7/15 [facility]- chest pain & TIA    HPI: Ronald Cameron is a 64 y.o. male presenting on 04/22/2020 for Transitions Of Care (7/15 [facility]- chest pain & TIA)  Patient is here for a hospital follow-up.  He was admitted to [facility] from 04/09/2020 - 04/09/2020 after presenting with complaints of palpitations and chest discomfort.  Patient had cardiac cath which showed proximal RCA lesion with 60% stenosis but nonobstructive.  Echo showed EF of 55 to 60%, mild left ventricular hypertrophy, grade 1 diastolic dysfunction, no regional wall motion abnormalities.  Patient was experiencing a lot of dizziness.  He had an MRI of the brain which showed 2 punctuate right frontal acute infarcts which was felt to be associated with a post-cath complication.  CTA of the neck and venous duplex of the carotid artery confirms severe stenosis of the left ICA.  Vascular surgery placed a stent.  Patient is to continue aspirin, Plavix, and atorvastatin.  Patient experienced very labile blood pressures post stent placement.  Once patient stabilized he was discharged home with instructions to follow-up  with PCP in 1 week for BMP/CBC, as well as follow-ups with vascular surgery, neurology, cardiology, and physical therapy.  Patient is taking medications as prescribed.  He denies any further episodes of dizziness and therefore does not feel he needs physical therapy for vestibular rehab.  He does have follow-up appointments already scheduled with vascular, neurology, and cardiology.  New complaints: None  Social history:  Relevant past medical, surgical, family and social history reviewed and updated as indicated. Interim medical history since our last visit reviewed.  Allergies and medications reviewed and updated.  DATA REVIEWED: CHART IN EPIC  ROS: Negative unless specifically indicated above in HPI.    Current Outpatient Medications:  .  alendronate (FOSAMAX) 70 MG tablet, Take 70 mg by mouth once a week. , Disp: , Rfl:  .  Ascorbic Acid (VITAMIN C) 1000 MG tablet, Take 500 mg by mouth daily. , Disp: , Rfl:  .  aspirin 81 MG tablet, Take 81 mg by mouth daily., Disp: , Rfl:  .  atorvastatin (LIPITOR) 80 MG tablet, Take 1 tablet (80 mg total) by mouth daily., Disp: 30 tablet, Rfl: 0 .  Calcium Carb-Cholecalciferol (CALCIUM-VITAMIN D) 600-400 MG-UNIT TABS, Take 2 tablets by mouth daily. 1273m daily, Disp: , Rfl:  .  clopidogrel (PLAVIX) 75 MG tablet, Take 1 tablet (75 mg total) by mouth daily., Disp: 30 tablet, Rfl: 0 .  folic acid (FOLVITE) 1 MG tablet, Take 1 mg by mouth daily., Disp: , Rfl:  .  Methotrexate Sodium (METHOTREXATE, PF,) 50 MG/2ML injection, Inject 12.5 mg into the muscle  once a week. , Disp: , Rfl: 3 .  nitroGLYCERIN (NITROSTAT) 0.4 MG SL tablet, Place 1 tablet (0.4 mg total) under the tongue every 5 (five) minutes x 3 doses as needed for up to 30 doses for chest pain., Disp: 30 tablet, Rfl: 0 .  predniSONE (DELTASONE) 10 MG tablet, Take 10 mg by mouth daily., Disp: , Rfl:  .  Tofacitinib Citrate ER (XELJANZ XR) 11 MG TB24, Take 11 mg by mouth daily., Disp: , Rfl:  .   traMADol (ULTRAM) 50 MG tablet, Take 1 tablet (50 mg total) by mouth every 6 (six) hours as needed. (Patient taking differently: Take 50-100 mg by mouth every 6 (six) hours as needed for moderate pain. ), Disp: 20 tablet, Rfl: 0 .  traZODone (DESYREL) 50 MG tablet, Take 50 mg by mouth at bedtime as needed for sleep. , Disp: , Rfl:    No Known Allergies Past Medical History:  Diagnosis Date  . Arthritis   . Bowel obstruction ([place])   . Cataract   . Collagen vascular disease ([facility])    Rheumatoid Arthritis  . Colon polyps   . Diverticulosis   . Kidney stones   . Rheumatoid arthritis [facility])     Past Surgical History:  Procedure Laterality Date  . CATARACT EXTRACTION, BILATERAL    . COLONOSCOPY N/A 04/13/2016   Dr. [name]: 14 mm polyp in the ascending colon removed, diverticulosis in the distal descending colon and sigmoid colon, nonbleeding internal hemorrhoids. Pathology-tubular adenoma. Next colonoscopy 5 years.  . ELBOW SURGERY     X 3 last year. Staph infection but not MRSA per patient. infectious disease. out of work 6 months.   . ENDOVASCULAR STENT INSERTION Left 04/15/2020   Procedure: INSERTION OF ENDOVASCULAR STENT INTO THE LEFT INTERNAL COMMON CAROTID ARTERY;  Surgeon: Elam Dutch, MD;  Location: [facility];  Service: Vascular;  Laterality: Left;  . INTRAVASCULAR PRESSURE WIRE/FFR STUDY N/A 04/10/2020   Procedure: INTRAVASCULAR PRESSURE WIRE/FFR STUDY;  Surgeon: Martinique, Peter M, MD;  Location: [facility];  Service: Cardiovascular;  Laterality: N/A;  . LEFT HEART CATH AND CORONARY ANGIOGRAPHY N/A 04/10/2020   Procedure: LEFT HEART CATH AND CORONARY ANGIOGRAPHY;  Surgeon: Martinique, Peter M, MD;  Location: [facility];  Service: Cardiovascular;  Laterality: N/A;  . TONSILLECTOMY    . TRANSCAROTID ARTERY REVASCULARIZATION Left 04/15/2020   Procedure: TRANSCAROTID ARTERY REVASCULARIZATION;  Surgeon: Elam Dutch, MD;  Location: [facility];  Service: Vascular;  Laterality: Left;   . ULTRASOUND GUIDANCE FOR VASCULAR ACCESS Right 04/15/2020   Procedure: ULTRASOUND GUIDANCE FOR VASCULAR ACCESS;  Surgeon: Elam Dutch, MD;  Location: [facility];  Service: Vascular;  Laterality: Right;    Social History   Socioeconomic History  . Marital status: Married    Spouse name: Not on file  . Number of children: 0  . Years of education: Not on file  . Highest education level: 12th grade  Occupational History  . Occupation: Disbility  Tobacco Use  . Smoking status: Former Smoker    Packs/day: 1.00    Years: 39.00    Pack years: 39.00    Types: Cigarettes    Start date: 07/11/1975    Quit date: 04/26/2014    Years since quitting: 5.9  . Smokeless tobacco: Never Used  . Tobacco comment: Quit x 3 years  Vaping Use  . Vaping Use: Never used  Substance and Sexual Activity  . Alcohol use: No    Alcohol/week: 0.0 standard  drinks  . Drug use: No  . Sexual activity: Not Currently  Other Topics Concern  . Not on file  Social History Narrative  . Not on file   Social Determinants of Health   Financial Resource Strain:   . Difficulty of Paying Living Expenses:   Food Insecurity:   . Worried About Charity fundraiser in the Last Year:   . Arboriculturist in the Last Year:   Transportation Needs:   . Film/video editor (Medical):   Marland Kitchen Lack of Transportation (Non-Medical):   Physical Activity:   . Days of Exercise per Week:   . Minutes of Exercise per Session:   Stress:   . Feeling of Stress :   Social Connections:   . Frequency of Communication with Friends and Family:   . Frequency of Social Gatherings with Friends and Family:   . Attends Religious Services:   . Active Member of Clubs or Organizations:   . Attends Archivist Meetings:   Marland Kitchen Marital Status:   Intimate Partner Violence:   . Fear of Current or Ex-Partner:   . Emotionally Abused:   Marland Kitchen Physically Abused:   . Sexually Abused:         Objective:    BP 122/76   Pulse 82   Temp (!)  97.1 F (36.2 C) (Temporal)   Ht '5\' 11"'  (1.803 m)   Wt (!) 225 lb 6.4 oz (102.2 kg)   SpO2 95%   BMI 31.44 kg/m   Wt Readings from Last 3 Encounters:  04/22/20 (!) 225 lb 6.4 oz (102.2 kg)  04/15/20 219 lb 1.6 oz (99.4 kg)  12/18/19 235 lb (106.6 kg)    Physical Exam Vitals reviewed.  Constitutional:      General: He is not in acute distress.    Appearance: Normal appearance. He is obese. He is not ill-appearing, toxic-appearing or diaphoretic.  HENT:     Head: Normocephalic and atraumatic.  Eyes:     General: No scleral icterus.       Right eye: No discharge.        Left eye: No discharge.     Conjunctiva/sclera: Conjunctivae normal.  Cardiovascular:     Rate and Rhythm: Normal rate and regular rhythm.     Heart sounds: Normal heart sounds. No murmur heard.  No friction rub. No gallop.   Pulmonary:     Effort: Pulmonary effort is normal. No respiratory distress.     Breath sounds: Normal breath sounds. No stridor. No wheezing, rhonchi or rales.  Musculoskeletal:        General: Normal range of motion.     Cervical back: Normal range of motion.  Skin:    General: Skin is warm and dry.  Neurological:     Mental Status: He is alert and oriented to person, place, and time. Mental status is at baseline.  Psychiatric:        Mood and Affect: Mood normal.        Behavior: Behavior normal.        Thought Content: Thought content normal.        Judgment: Judgment normal.     No results found for: TSH Lab Results  Component Value Date   WBC 9.4 04/18/2020   HGB 14.0 04/18/2020   HCT 42.2 04/18/2020   MCV 97.0 04/18/2020   PLT 376 04/18/2020   Lab Results  Component Value Date   NA 139 04/18/2020   K 3.9  04/18/2020   CO2 22 04/18/2020   GLUCOSE 87 04/18/2020   BUN 14 04/18/2020   CREATININE 0.93 04/18/2020   BILITOT 1.0 09/03/2017   ALKPHOS 39 09/03/2017   AST 24 09/03/2017   ALT 33 09/03/2017   PROT 6.5 09/03/2017   ALBUMIN 2.8 (L) 04/18/2020   CALCIUM  8.4 (L) 04/18/2020   ANIONGAP 10 04/18/2020   Lab Results  Component Value Date   CHOL 235 (H) 04/10/2020   Lab Results  Component Value Date   HDL 32 (L) 04/10/2020   Lab Results  Component Value Date   LDLCALC 184 (H) 04/10/2020   Lab Results  Component Value Date   TRIG 96 04/10/2020   Lab Results  Component Value Date   CHOLHDL 7.3 04/10/2020   Lab Results  Component Value Date   HGBA1C 5.7 (H) 04/11/2020

## 2020-04-23 ENCOUNTER — Ambulatory Visit: Payer: PPO | Admitting: Family

## 2020-04-23 LAB — BMP8+EGFR
BUN/Creatinine Ratio: 15 (ref 10–24)
BUN: 16 mg/dL (ref 8–27)
CO2: 22 mmol/L (ref 20–29)
Calcium: 9.6 mg/dL (ref 8.6–10.2)
Chloride: 104 mmol/L (ref 96–106)
Creatinine, Ser: 1.05 mg/dL (ref 0.76–1.27)
GFR calc Af Amer: 88 mL/min/{1.73_m2} (ref >59)
GFR calc non Af Amer: 76 mL/min/{1.73_m2} (ref >59)
Glucose: 90 mg/dL (ref 65–99)
Potassium: 4.2 mmol/L (ref 3.5–5.2)
Sodium: 139 mmol/L (ref 134–144)

## 2020-04-23 LAB — CBC WITH DIFFERENTIAL/PLATELET
Basophils Absolute: 0.1 10*3/uL (ref 0.0–0.2)
Basos: 1 %
EOS (ABSOLUTE): 0.1 10*3/uL (ref 0.0–0.4)
Eos: 1 %
Hematocrit: 44.2 % (ref 37.5–51.0)
Hemoglobin: 15.2 g/dL (ref 13.0–17.7)
Immature Grans (Abs): 0.1 10*3/uL (ref 0.0–0.1)
Immature Granulocytes: 1 %
Lymphocytes Absolute: 0.9 10*3/uL (ref 0.7–3.1)
Lymphs: 8 %
MCH: 32 pg (ref 26.6–33.0)
MCHC: 34.4 g/dL (ref 31.5–35.7)
MCV: 93 fL (ref 79–97)
Monocytes Absolute: 0.7 10*3/uL (ref 0.1–0.9)
Monocytes: 6 %
Neutrophils Absolute: 9.9 10*3/uL — ABNORMAL HIGH (ref 1.4–7.0)
Neutrophils: 83 %
Platelets: 297 10*3/uL (ref 150–450)
RBC: 4.75 x10E6/uL (ref 4.14–5.80)
RDW: 13.2 % (ref 11.6–15.4)
WBC: 11.7 10*3/uL — ABNORMAL HIGH (ref 3.4–10.8)

## 2020-04-25 ENCOUNTER — Encounter: Payer: Self-pay | Admitting: Family Medicine

## 2020-05-05 ENCOUNTER — Other Ambulatory Visit: Payer: Self-pay

## 2020-05-06 ENCOUNTER — Other Ambulatory Visit: Payer: Self-pay

## 2020-05-06 DIAGNOSIS — I6522 Occlusion and stenosis of left carotid artery: Secondary | ICD-10-CM

## 2020-05-11 ENCOUNTER — Other Ambulatory Visit: Payer: Self-pay | Admitting: Family

## 2020-05-13 ENCOUNTER — Ambulatory Visit: Payer: PPO | Admitting: Student

## 2020-05-14 ENCOUNTER — Ambulatory Visit (HOSPITAL_COMMUNITY): Payer: PPO

## 2020-05-14 ENCOUNTER — Encounter: Payer: PPO | Admitting: Vascular Surgery

## 2020-05-18 ENCOUNTER — Ambulatory Visit (INDEPENDENT_AMBULATORY_CARE_PROVIDER_SITE_OTHER): Payer: PPO | Admitting: Nurse Practitioner

## 2020-05-18 ENCOUNTER — Encounter: Payer: Self-pay | Admitting: Nurse Practitioner

## 2020-05-18 VITALS — Temp 99.1°F

## 2020-05-18 DIAGNOSIS — R059 Cough, unspecified: Secondary | ICD-10-CM | POA: Insufficient documentation

## 2020-05-18 DIAGNOSIS — R05 Cough: Secondary | ICD-10-CM

## 2020-05-18 MED ORDER — BENZONATATE 100 MG PO CAPS: 100.0000 mg | ORAL_CAPSULE | Freq: Three times a day (TID) | ORAL | 0 refills | Status: AC | PRN

## 2020-05-18 NOTE — Progress Notes (Signed)
   Virtual Visit via telephone Note Due to COVID-19 pandemic this visit was conducted virtually. This visit type was conducted due to national recommendations for restrictions regarding the COVID-19 Pandemic (e.g. social distancing, sheltering in place) in an effort to limit this patient's exposure and mitigate transmission in our community. All issues noted in this document were discussed and addressed.  A physical exam was not performed with this format.  I connected with Ronald Cameron on 05/18/20 at 02:20pm by telephone and verified that I am speaking with the correct person using two identifiers. Ronald Cameron is currently located at home and spouse is currently with patient during visit. The provider, Ivy Lynn, NP is located in their office at time of visit.  I discussed the limitations, risks, security and privacy concerns of performing an evaluation and management service by telephone and the availability of in person appointments. I also discussed with the patient that there may be a patient responsible charge related to this service. The patient expressed understanding and agreed to proceed.   History and Present Illness:  Cough This is a new problem. The current episode started in the past 7 days. The problem has been gradually worsening. The problem occurs constantly. The cough is non-productive. Associated symptoms include chills, a fever, headaches, myalgias and sweats. Pertinent negatives include no ear pain. He has tried OTC cough suppressant for the symptoms. The treatment provided no relief.      Review of Systems  Constitutional: Positive for chills and fever.  HENT: Negative for ear pain.   Respiratory: Positive for cough.   Cardiovascular: Negative.   Gastrointestinal: Negative.   Genitourinary: Negative.   Musculoskeletal: Positive for myalgias.  Skin: Negative.   Neurological: Positive for headaches.     Observations/Objective: Visit was done  virtual  Assessment and Plan: Cough Patient is a 64 year old male who is seen today via virtual visit for cough and flulike symptoms.  Patient is reporting headache, fever, chills fever diarrhea, loss of appetite, and sweating.  Patient reports current temperature is 99.1, said today temperature was 100.1.  Patient has tried ibuprofen, over-the-counter cough suppressant with no therapeutic effect. Advised patient to call [phone] for Covid testing.  Educated patient to continue hydration, Tylenol for headache and fever, Tessalon Perles started for cough.  Advised patient to stay at home until COVID-19 test results come back.  Follow-up with worsening or unresolved symptoms.   Follow Up Instructions: Follow-up with worsening or unresolved symptoms.    I discussed the assessment and treatment plan with the patient. The patient was provided an opportunity to ask questions and all were answered. The patient agreed with the plan and demonstrated an understanding of the instructions.   The patient was advised to call back or seek an in-person evaluation if the symptoms worsen or if the condition fails to improve as anticipated.  The above assessment and management plan was discussed with the patient. The patient verbalized understanding of and has agreed to the management plan. Patient is aware to call the clinic if symptoms persist or worsen. Patient is aware when to return to the clinic for a follow-up visit. Patient educated on when it is appropriate to go to the emergency department.   Time call ended: 02: 30 pm  I provided 10 minutes of non-face-to-face time during this encounter.    Ivy Lynn, NP

## 2020-05-18 NOTE — Patient Instructions (Signed)

## 2020-05-18 NOTE — Assessment & Plan Note (Signed)
Patient is a 64 year old male who is seen today via virtual visit for cough and flulike symptoms.  Patient is reporting headache, fever, chills fever diarrhea, loss of appetite, and sweating.  Patient reports current temperature is 99.1, said today temperature was 100.1.  Patient has tried ibuprofen, over-the-counter cough suppressant with no therapeutic effect. Advised patient to call [phone] for Covid testing.  Educated patient to continue hydration, Tylenol for headache and fever, Tessalon Perles started for cough.  Advised patient to stay at home until COVID-19 test results come back.  Follow-up with worsening or unresolved symptoms.

## 2020-05-19 ENCOUNTER — Other Ambulatory Visit: Payer: Self-pay

## 2020-05-19 ENCOUNTER — Inpatient Hospital Stay (HOSPITAL_COMMUNITY)
Admission: EM | Admit: 2020-05-19 | Discharge: 2020-05-27 | DRG: 871 | Disposition: E | Payer: PPO | Attending: Internal Medicine | Admitting: Internal Medicine

## 2020-05-19 ENCOUNTER — Emergency Department (HOSPITAL_COMMUNITY): Payer: PPO

## 2020-05-19 ENCOUNTER — Inpatient Hospital Stay (HOSPITAL_COMMUNITY): Payer: PPO

## 2020-05-19 ENCOUNTER — Other Ambulatory Visit: Payer: PPO

## 2020-05-19 DIAGNOSIS — R7989 Other specified abnormal findings of blood chemistry: Secondary | ICD-10-CM

## 2020-05-19 DIAGNOSIS — U071 COVID-19: Secondary | ICD-10-CM | POA: Diagnosis present

## 2020-05-19 DIAGNOSIS — R748 Abnormal levels of other serum enzymes: Secondary | ICD-10-CM | POA: Diagnosis not present

## 2020-05-19 DIAGNOSIS — K529 Noninfective gastroenteritis and colitis, unspecified: Secondary | ICD-10-CM | POA: Diagnosis not present

## 2020-05-19 DIAGNOSIS — Z87891 Personal history of nicotine dependence: Secondary | ICD-10-CM

## 2020-05-19 DIAGNOSIS — N179 Acute kidney failure, unspecified: Secondary | ICD-10-CM | POA: Diagnosis not present

## 2020-05-19 DIAGNOSIS — I2510 Atherosclerotic heart disease of native coronary artery without angina pectoris: Secondary | ICD-10-CM | POA: Diagnosis not present

## 2020-05-19 DIAGNOSIS — I248 Other forms of acute ischemic heart disease: Secondary | ICD-10-CM | POA: Diagnosis not present

## 2020-05-19 DIAGNOSIS — I517 Cardiomegaly: Secondary | ICD-10-CM | POA: Diagnosis not present

## 2020-05-19 DIAGNOSIS — Z9842 Cataract extraction status, left eye: Secondary | ICD-10-CM | POA: Diagnosis not present

## 2020-05-19 DIAGNOSIS — I6529 Occlusion and stenosis of unspecified carotid artery: Secondary | ICD-10-CM | POA: Diagnosis not present

## 2020-05-19 DIAGNOSIS — Z7982 Long term (current) use of aspirin: Secondary | ICD-10-CM | POA: Diagnosis not present

## 2020-05-19 DIAGNOSIS — Z8249 Family history of ischemic heart disease and other diseases of the circulatory system: Secondary | ICD-10-CM

## 2020-05-19 DIAGNOSIS — R6520 Severe sepsis without septic shock: Secondary | ICD-10-CM | POA: Diagnosis not present

## 2020-05-19 DIAGNOSIS — Z6830 Body mass index (BMI) 30.0-30.9, adult: Secondary | ICD-10-CM

## 2020-05-19 DIAGNOSIS — Z8673 Personal history of transient ischemic attack (TIA), and cerebral infarction without residual deficits: Secondary | ICD-10-CM

## 2020-05-19 DIAGNOSIS — Z79899 Other long term (current) drug therapy: Secondary | ICD-10-CM

## 2020-05-19 DIAGNOSIS — D849 Immunodeficiency, unspecified: Secondary | ICD-10-CM | POA: Diagnosis not present

## 2020-05-19 DIAGNOSIS — K760 Fatty (change of) liver, not elsewhere classified: Secondary | ICD-10-CM | POA: Diagnosis present

## 2020-05-19 DIAGNOSIS — M069 Rheumatoid arthritis, unspecified: Secondary | ICD-10-CM | POA: Diagnosis present

## 2020-05-19 DIAGNOSIS — E785 Hyperlipidemia, unspecified: Secondary | ICD-10-CM | POA: Diagnosis present

## 2020-05-19 DIAGNOSIS — R778 Other specified abnormalities of plasma proteins: Secondary | ICD-10-CM | POA: Diagnosis present

## 2020-05-19 DIAGNOSIS — Z8719 Personal history of other diseases of the digestive system: Secondary | ICD-10-CM

## 2020-05-19 DIAGNOSIS — J9601 Acute respiratory failure with hypoxia: Secondary | ICD-10-CM | POA: Diagnosis not present

## 2020-05-19 DIAGNOSIS — A419 Sepsis, unspecified organism: Secondary | ICD-10-CM | POA: Diagnosis present

## 2020-05-19 DIAGNOSIS — K219 Gastro-esophageal reflux disease without esophagitis: Secondary | ICD-10-CM | POA: Diagnosis present

## 2020-05-19 DIAGNOSIS — A4189 Other specified sepsis: Secondary | ICD-10-CM | POA: Diagnosis not present

## 2020-05-19 DIAGNOSIS — R0602 Shortness of breath: Secondary | ICD-10-CM | POA: Diagnosis not present

## 2020-05-19 DIAGNOSIS — M47814 Spondylosis without myelopathy or radiculopathy, thoracic region: Secondary | ICD-10-CM | POA: Diagnosis not present

## 2020-05-19 DIAGNOSIS — Z9841 Cataract extraction status, right eye: Secondary | ICD-10-CM | POA: Diagnosis not present

## 2020-05-19 DIAGNOSIS — Z7902 Long term (current) use of antithrombotics/antiplatelets: Secondary | ICD-10-CM

## 2020-05-19 DIAGNOSIS — G894 Chronic pain syndrome: Secondary | ICD-10-CM | POA: Diagnosis not present

## 2020-05-19 DIAGNOSIS — E875 Hyperkalemia: Secondary | ICD-10-CM | POA: Diagnosis not present

## 2020-05-19 DIAGNOSIS — J1282 Pneumonia due to coronavirus disease 2019: Secondary | ICD-10-CM | POA: Diagnosis present

## 2020-05-19 DIAGNOSIS — R000 Tachycardia, unspecified: Secondary | ICD-10-CM | POA: Diagnosis not present

## 2020-05-19 DIAGNOSIS — E860 Dehydration: Secondary | ICD-10-CM | POA: Diagnosis not present

## 2020-05-19 DIAGNOSIS — I4891 Unspecified atrial fibrillation: Secondary | ICD-10-CM | POA: Diagnosis not present

## 2020-05-19 DIAGNOSIS — I468 Cardiac arrest due to other underlying condition: Secondary | ICD-10-CM | POA: Diagnosis not present

## 2020-05-19 DIAGNOSIS — Z66 Do not resuscitate: Secondary | ICD-10-CM | POA: Diagnosis not present

## 2020-05-19 DIAGNOSIS — Z95828 Presence of other vascular implants and grafts: Secondary | ICD-10-CM | POA: Diagnosis not present

## 2020-05-19 DIAGNOSIS — Z833 Family history of diabetes mellitus: Secondary | ICD-10-CM

## 2020-05-19 DIAGNOSIS — Z87442 Personal history of urinary calculi: Secondary | ICD-10-CM

## 2020-05-19 DIAGNOSIS — Z7952 Long term (current) use of systemic steroids: Secondary | ICD-10-CM

## 2020-05-19 DIAGNOSIS — I700 Atherosclerosis of aorta: Secondary | ICD-10-CM | POA: Diagnosis not present

## 2020-05-19 LAB — COMPREHENSIVE METABOLIC PANEL
ALT: 55 U/L — ABNORMAL HIGH (ref 0–44)
ALT: 49 U/L — ABNORMAL HIGH (ref 0–44)
AST: 90 U/L — ABNORMAL HIGH (ref 15–41)
AST: 87 U/L — ABNORMAL HIGH (ref 15–41)
Albumin: 2.8 g/dL — ABNORMAL LOW (ref 3.5–5.0)
Albumin: 2.4 g/dL — ABNORMAL LOW (ref 3.5–5.0)
Alkaline Phosphatase: 40 U/L (ref 38–126)
Alkaline Phosphatase: 37 U/L — ABNORMAL LOW (ref 38–126)
Anion gap: 14 (ref 5–15)
Anion gap: 15 (ref 5–15)
BUN: 34 mg/dL — ABNORMAL HIGH (ref 8–23)
BUN: 36 mg/dL — ABNORMAL HIGH (ref 8–23)
CO2: 19 mmol/L — ABNORMAL LOW (ref 22–32)
CO2: 17 mmol/L — ABNORMAL LOW (ref 22–32)
Calcium: 8.3 mg/dL — ABNORMAL LOW (ref 8.9–10.3)
Calcium: 7.7 mg/dL — ABNORMAL LOW (ref 8.9–10.3)
Chloride: 107 mmol/L (ref 98–111)
Chloride: 107 mmol/L (ref 98–111)
Creatinine, Ser: 1.81 mg/dL — ABNORMAL HIGH (ref 0.61–1.24)
Creatinine, Ser: 1.56 mg/dL — ABNORMAL HIGH (ref 0.61–1.24)
GFR calc Af Amer: 46 mL/min — ABNORMAL LOW (ref >60)
GFR calc Af Amer: 55 mL/min — ABNORMAL LOW (ref >60)
GFR calc non Af Amer: 39 mL/min — ABNORMAL LOW (ref >60)
GFR calc non Af Amer: 47 mL/min — ABNORMAL LOW (ref >60)
Glucose, Bld: 132 mg/dL — ABNORMAL HIGH (ref 70–99)
Glucose, Bld: 142 mg/dL — ABNORMAL HIGH (ref 70–99)
Potassium: 5.2 mmol/L — ABNORMAL HIGH (ref 3.5–5.1)
Potassium: 5.1 mmol/L (ref 3.5–5.1)
Sodium: 140 mmol/L (ref 135–145)
Sodium: 139 mmol/L (ref 135–145)
Total Bilirubin: 0.6 mg/dL (ref 0.3–1.2)
Total Bilirubin: 0.7 mg/dL (ref 0.3–1.2)
Total Protein: 7.3 g/dL (ref 6.5–8.1)
Total Protein: 6.5 g/dL (ref 6.5–8.1)

## 2020-05-19 LAB — CBC WITH DIFFERENTIAL/PLATELET
Abs Immature Granulocytes: 0 10*3/uL (ref 0.00–0.07)
Abs Immature Granulocytes: 0 10*3/uL (ref 0.00–0.07)
Basophils Absolute: 0 10*3/uL (ref 0.0–0.1)
Basophils Absolute: 0 10*3/uL (ref 0.0–0.1)
Basophils Relative: 0 %
Basophils Relative: 0 %
Eosinophils Absolute: 0 10*3/uL (ref 0.0–0.5)
Eosinophils Absolute: 0 10*3/uL (ref 0.0–0.5)
Eosinophils Relative: 0 %
Eosinophils Relative: 0 %
HCT: 60.1 % — ABNORMAL HIGH (ref 39.0–52.0)
HCT: 56.9 % — ABNORMAL HIGH (ref 39.0–52.0)
Hemoglobin: 19.3 g/dL — ABNORMAL HIGH (ref 13.0–17.0)
Hemoglobin: 18.1 g/dL — ABNORMAL HIGH (ref 13.0–17.0)
Lymphocytes Relative: 1 %
Lymphocytes Relative: 1 %
Lymphs Abs: 0.1 10*3/uL — ABNORMAL LOW (ref 0.7–4.0)
Lymphs Abs: 0.1 10*3/uL — ABNORMAL LOW (ref 0.7–4.0)
MCH: 31.9 pg (ref 26.0–34.0)
MCH: 31.2 pg (ref 26.0–34.0)
MCHC: 32.1 g/dL (ref 30.0–36.0)
MCHC: 31.8 g/dL (ref 30.0–36.0)
MCV: 99.3 fL (ref 80.0–100.0)
MCV: 97.9 fL (ref 80.0–100.0)
Monocytes Absolute: 0 10*3/uL — ABNORMAL LOW (ref 0.1–1.0)
Monocytes Absolute: 0.1 10*3/uL (ref 0.1–1.0)
Monocytes Relative: 0 %
Monocytes Relative: 1 %
Neutro Abs: 11.9 10*3/uL — ABNORMAL HIGH (ref 1.7–7.7)
Neutro Abs: 12.7 10*3/uL — ABNORMAL HIGH (ref 1.7–7.7)
Neutrophils Relative %: 99 %
Neutrophils Relative %: 98 %
Platelets: 221 10*3/uL (ref 150–400)
Platelets: 227 10*3/uL (ref 150–400)
RBC: 6.05 MIL/uL — ABNORMAL HIGH (ref 4.22–5.81)
RBC: 5.81 MIL/uL (ref 4.22–5.81)
RDW: 15.6 % — ABNORMAL HIGH (ref 11.5–15.5)
RDW: 15.4 % (ref 11.5–15.5)
WBC: 12 10*3/uL — ABNORMAL HIGH (ref 4.0–10.5)
WBC: 13 10*3/uL — ABNORMAL HIGH (ref 4.0–10.5)
nRBC: 0 % (ref 0.0–0.2)
nRBC: 0 /100 WBC
nRBC: 0 % (ref 0.0–0.2)
nRBC: 0 /100 WBC

## 2020-05-19 LAB — TROPONIN I (HIGH SENSITIVITY)
Troponin I (High Sensitivity): 324 ng/L (ref <18)
Troponin I (High Sensitivity): 220 ng/L (ref <18)

## 2020-05-19 LAB — BRAIN NATRIURETIC PEPTIDE: B Natriuretic Peptide: 805.4 pg/mL — ABNORMAL HIGH (ref 0.0–100.0)

## 2020-05-19 LAB — ABO/RH: ABO/RH(D): A POS

## 2020-05-19 LAB — FIBRINOGEN: Fibrinogen: 683 mg/dL — ABNORMAL HIGH (ref 210–475)

## 2020-05-19 LAB — TRIGLYCERIDES: Triglycerides: 133 mg/dL (ref <150)

## 2020-05-19 LAB — FERRITIN: Ferritin: 2074 ng/mL — ABNORMAL HIGH (ref 24–336)

## 2020-05-19 LAB — SARS CORONAVIRUS 2 BY RT PCR (HOSPITAL ORDER, PERFORMED IN ~~LOC~~ HOSPITAL LAB): SARS Coronavirus 2: POSITIVE — AB

## 2020-05-19 LAB — LACTATE DEHYDROGENASE: LDH: 743 U/L — ABNORMAL HIGH (ref 98–192)

## 2020-05-19 LAB — C-REACTIVE PROTEIN: CRP: 11.3 mg/dL — ABNORMAL HIGH (ref <1.0)

## 2020-05-19 LAB — LACTIC ACID, PLASMA
Lactic Acid, Venous: 5.3 mmol/L (ref 0.5–1.9)
Lactic Acid, Venous: 5.5 mmol/L (ref 0.5–1.9)

## 2020-05-19 LAB — D-DIMER, QUANTITATIVE: D-Dimer, Quant: 4.94 ug/mL-FEU — ABNORMAL HIGH (ref 0.00–0.50)

## 2020-05-19 LAB — PROCALCITONIN: Procalcitonin: 1.26 ng/mL

## 2020-05-19 LAB — CBG MONITORING, ED: Glucose-Capillary: 156 mg/dL — ABNORMAL HIGH (ref 70–99)

## 2020-05-19 LAB — HEPATITIS B SURFACE ANTIGEN: Hepatitis B Surface Ag: NONREACTIVE

## 2020-05-19 MED ORDER — DEXAMETHASONE SODIUM PHOSPHATE 10 MG/ML IJ SOLN
10.0000 mg | Freq: Once | INTRAMUSCULAR | Status: AC
  Administered 2020-05-19: 10 mg via INTRAVENOUS
  Filled 2020-05-19: qty 1

## 2020-05-19 MED ORDER — ALBUTEROL SULFATE HFA 108 (90 BASE) MCG/ACT IN AERS
2.0000 | INHALATION_SPRAY | Freq: Four times a day (QID) | RESPIRATORY_TRACT | Status: DC
  Administered 2020-05-19 – 2020-05-20 (×4): 2 via RESPIRATORY_TRACT
  Filled 2020-05-19: qty 6.7

## 2020-05-19 MED ORDER — SODIUM CHLORIDE 0.9 % IV SOLN
1.0000 g | INTRAVENOUS | Status: DC
  Administered 2020-05-19 – 2020-05-20 (×2): 1 g via INTRAVENOUS
  Filled 2020-05-19 (×2): qty 10

## 2020-05-19 MED ORDER — FOLIC ACID 1 MG PO TABS
1.0000 mg | ORAL_TABLET | Freq: Every day | ORAL | Status: DC
  Administered 2020-05-20: 1 mg via ORAL
  Filled 2020-05-19: qty 1

## 2020-05-19 MED ORDER — ACETAMINOPHEN 325 MG PO TABS: 650.0000 mg | ORAL_TABLET | Freq: Four times a day (QID) | ORAL | Status: DC | PRN

## 2020-05-19 MED ORDER — ATORVASTATIN CALCIUM 80 MG PO TABS
80.0000 mg | ORAL_TABLET | Freq: Every day | ORAL | Status: DC
  Administered 2020-05-20: 80 mg via ORAL
  Filled 2020-05-19 (×2): qty 1

## 2020-05-19 MED ORDER — SODIUM CHLORIDE 0.9 % IV SOLN
200.0000 mg | Freq: Once | INTRAVENOUS | Status: AC
  Administered 2020-05-19: 200 mg via INTRAVENOUS
  Filled 2020-05-19: qty 40

## 2020-05-19 MED ORDER — ONDANSETRON HCL 4 MG/2ML IJ SOLN: 4.0000 mg | Freq: Four times a day (QID) | INTRAMUSCULAR | Status: DC | PRN

## 2020-05-19 MED ORDER — HYDROCOD POLST-CPM POLST ER 10-8 MG/5ML PO SUER: 5.0000 mL | Freq: Two times a day (BID) | ORAL | Status: DC | PRN

## 2020-05-19 MED ORDER — ENOXAPARIN SODIUM 40 MG/0.4ML ~~LOC~~ SOLN
40.0000 mg | SUBCUTANEOUS | Status: DC
  Administered 2020-05-19 – 2020-05-20 (×2): 40 mg via SUBCUTANEOUS
  Filled 2020-05-19 (×2): qty 0.4

## 2020-05-19 MED ORDER — SODIUM CHLORIDE 0.9 % IV BOLUS
500.0000 mL | Freq: Once | INTRAVENOUS | Status: AC
  Administered 2020-05-19: 500 mL via INTRAVENOUS

## 2020-05-19 MED ORDER — AEROCHAMBER PLUS FLO-VU LARGE MISC: 1.0000 | Freq: Once | Status: DC

## 2020-05-19 MED ORDER — TOFACITINIB CITRATE ER 11 MG PO TB24: 11.0000 mg | ORAL_TABLET | Freq: Every day | ORAL | Status: DC

## 2020-05-19 MED ORDER — IOHEXOL 350 MG/ML SOLN
60.0000 mL | Freq: Once | INTRAVENOUS | Status: AC | PRN
  Administered 2020-05-19: 60 mL via INTRAVENOUS

## 2020-05-19 MED ORDER — METHYLPREDNISOLONE SODIUM SUCC 125 MG IJ SOLR
50.0000 mg | Freq: Two times a day (BID) | INTRAMUSCULAR | Status: DC
  Administered 2020-05-19 – 2020-05-20 (×3): 50 mg via INTRAVENOUS
  Filled 2020-05-19 (×3): qty 2

## 2020-05-19 MED ORDER — SODIUM CHLORIDE 0.9 % IV SOLN
500.0000 mg | INTRAVENOUS | Status: DC
  Administered 2020-05-20 (×2): 500 mg via INTRAVENOUS
  Filled 2020-05-19 (×2): qty 500

## 2020-05-19 MED ORDER — SODIUM ZIRCONIUM CYCLOSILICATE 10 G PO PACK
10.0000 g | PACK | Freq: Once | ORAL | Status: AC
  Administered 2020-05-19: 10 g via ORAL
  Filled 2020-05-19: qty 1

## 2020-05-19 MED ORDER — ALBUTEROL SULFATE HFA 108 (90 BASE) MCG/ACT IN AERS
2.0000 | INHALATION_SPRAY | Freq: Once | RESPIRATORY_TRACT | Status: AC
  Administered 2020-05-19: 2 via RESPIRATORY_TRACT
  Filled 2020-05-19: qty 6.7

## 2020-05-19 MED ORDER — SODIUM CHLORIDE 0.9 % IV SOLN
100.0000 mg | Freq: Every day | INTRAVENOUS | Status: DC
  Administered 2020-05-20: 100 mg via INTRAVENOUS
  Filled 2020-05-19 (×2): qty 20

## 2020-05-19 MED ORDER — ASCORBIC ACID 500 MG PO TABS
500.0000 mg | ORAL_TABLET | Freq: Every day | ORAL | Status: DC
  Administered 2020-05-20: 500 mg via ORAL
  Filled 2020-05-19: qty 1

## 2020-05-19 MED ORDER — ONDANSETRON HCL 4 MG PO TABS: 4.0000 mg | ORAL_TABLET | Freq: Four times a day (QID) | ORAL | Status: DC | PRN

## 2020-05-19 MED ORDER — GUAIFENESIN-DM 100-10 MG/5ML PO SYRP: 10.0000 mL | ORAL_SOLUTION | ORAL | Status: DC | PRN

## 2020-05-19 MED ORDER — TRAZODONE HCL 50 MG PO TABS: 50.0000 mg | ORAL_TABLET | Freq: Every evening | ORAL | Status: DC | PRN

## 2020-05-19 MED ORDER — ZINC SULFATE 220 (50 ZN) MG PO CAPS
220.0000 mg | ORAL_CAPSULE | Freq: Every day | ORAL | Status: DC
  Administered 2020-05-19 – 2020-05-20 (×2): 220 mg via ORAL
  Filled 2020-05-19 (×2): qty 1

## 2020-05-19 MED ORDER — CLOPIDOGREL BISULFATE 75 MG PO TABS
75.0000 mg | ORAL_TABLET | Freq: Every day | ORAL | Status: DC
  Administered 2020-05-19 – 2020-05-20 (×2): 75 mg via ORAL
  Filled 2020-05-19 (×2): qty 1

## 2020-05-19 MED ORDER — ASPIRIN EC 81 MG PO TBEC
81.0000 mg | DELAYED_RELEASE_TABLET | Freq: Every day | ORAL | Status: DC
  Administered 2020-05-19 – 2020-05-20 (×2): 81 mg via ORAL
  Filled 2020-05-19 (×2): qty 1

## 2020-05-19 MED ORDER — METHOTREXATE SODIUM CHEMO INJECTION (PF) 50 MG/2ML: 12.5000 mg | INTRAMUSCULAR | Status: DC

## 2020-05-19 MED ORDER — NITROGLYCERIN 0.4 MG SL SUBL: 0.4000 mg | SUBLINGUAL_TABLET | SUBLINGUAL | Status: DC | PRN

## 2020-05-19 MED ORDER — ALBUTEROL SULFATE (2.5 MG/3ML) 0.083% IN NEBU: 5.0000 mg | INHALATION_SOLUTION | Freq: Once | RESPIRATORY_TRACT | Status: DC

## 2020-05-19 NOTE — ED Triage Notes (Signed)
Pt arrived via POV with SOB.and fever(100.9) from home. Sx started feeling bad Tuesday of last week. Pt has not improved over time. Family PCP advised to be test. Pt has no known exposure.

## 2020-05-19 NOTE — ED Provider Notes (Signed)
[facility] EMERGENCY DEPARTMENT Provider Note   CSN: [id number] Arrival date & time: 05/23/2020  1000     History Chief Complaint  Patient presents with  . Fever  . Shortness of Breath    Ronald Cameron is a 64 y.o. male with a history of rheumatoid arthritis on methotrexate, xeljanz, & prednisone, hyperlipidemia, prior stroke, & is S/p endovascular stenting to left internal common carotid who presents to the ED with complaints of feeling poorly x 1 week. He reports fevers to 100.9, chills, generalized weakness, nasal congestion, dry cough, dyspnea (worse with exertion), poor PO intake, diarrhea, & chest pain which is intermittent, lasts a few minutes at a time, and is worse with a deep breath. Otherwise no significant alleviating/aggravating factors in his sxs. He did not receive his COVID 19 vaccine. He denies syncope, vomiting, abdominal pain, leg pain/swelling, hemoptysis, recent long travel, hormone use, personal hx of cancer, or hx of DVT/PE.   HPI     Past Medical History:  Diagnosis Date  . Arthritis   . Bowel obstruction ([place])   . Cataract   . Collagen vascular disease (H[facility])    Rheumatoid Arthritis  . Colon polyps   . Diverticulosis   . Kidney stones   . Rheumatoid arthritis [facility])     Patient Active Problem List   Diagnosis Date Noted  . Cough 05/18/2020  . Acute ischemic stroke ([place]) 04/16/2020  . Hyperlipidemia 04/16/2020  . Chest pain 04/09/2020  . Carotid stenosis 04/09/2020  . Hypokalemia 04/09/2020  . Displaced fracture of greater tuberosity of right humerus, initial encounter for closed fracture 08/29/2018  . Anterior dislocation of right shoulder 08/29/2018  . Enteritis   . Generalized abdominal pain   . Immunosuppressed status ([place]) 09/02/2017  . Morbid obesity with body mass index of 40.0-49.9 ([place]) 09/02/2017  . Aortic atherosclerosis ([place]) 09/01/2017  . Acute bilateral lower abdominal pain 08/31/2017  . Diarrhea 11/17/2016    . Abnormal hepatitis serology 11/17/2016  . Renal lesion 05/06/2016  . History of colonic polyps   . Diverticulosis of colon without hemorrhage   . Bloating 04/08/2016  . Encounter for screening colonoscopy 04/08/2016  . Colitis 04/06/2015  . Fatty liver 04/06/2015  . Early satiety 02/20/2015  . Abdominal pain, epigastric 02/20/2015  . Abdominal swelling, generalized 02/20/2015  . High risk medication use 02/20/2015  . Septic olecranon bursitis of right elbow 12/16/2013  . Pain in elbow 12/16/2013  . EXTERNAL HEMORRHOIDS 12/18/2008  . GERD 12/18/2008  . NEPHROLITHIASIS, HX OF 12/18/2008  . Rheumatoid arthritis ([place]) 12/17/2008  . Hemorrhoids 12/15/2008  . RECTAL BLEEDING 12/15/2008  . RECTAL PAIN 12/15/2008  . Lower abdominal pain 12/15/2008    Past Surgical History:  Procedure Laterality Date  . CATARACT EXTRACTION, BILATERAL    . COLONOSCOPY N/A 04/13/2016   Dr. [name]: 14 mm polyp in the ascending colon removed, diverticulosis in the distal descending colon and sigmoid colon, nonbleeding internal hemorrhoids. Pathology-tubular adenoma. Next colonoscopy 5 years.  . ELBOW SURGERY     X 3 last year. Staph infection but not MRSA per patient. infectious disease. out of work 6 months.   . ENDOVASCULAR STENT INSERTION Left 04/15/2020   Procedure: INSERTION OF ENDOVASCULAR STENT INTO THE LEFT INTERNAL COMMON CAROTID ARTERY;  Surgeon: Elam Dutch, MD;  Location: [place];  Service: Vascular;  Laterality: Left;  . INTRAVASCULAR PRESSURE WIRE/FFR STUDY N/A 04/10/2020   Procedure: INTRAVASCULAR PRESSURE WIRE/FFR STUDY;  Surgeon: Martinique, Peter M, MD;  Location:  [place] [facility];  Service: Cardiovascular;  Laterality: N/A;  . LEFT HEART CATH AND CORONARY ANGIOGRAPHY N/A 04/10/2020   Procedure: LEFT HEART CATH AND CORONARY ANGIOGRAPHY;  Surgeon: Martinique, Peter M, MD;  Location: [place] [facility];  Service: Cardiovascular;  Laterality: N/A;  . TONSILLECTOMY    . TRANSCAROTID ARTERY  REVASCULARIZATION Left 04/15/2020   Procedure: TRANSCAROTID ARTERY REVASCULARIZATION;  Surgeon: Elam Dutch, MD;  Location: [place];  Service: Vascular;  Laterality: Left;  . ULTRASOUND GUIDANCE FOR VASCULAR ACCESS Right 04/15/2020   Procedure: ULTRASOUND GUIDANCE FOR VASCULAR ACCESS;  Surgeon: Elam Dutch, MD;  Location: [facility];  Service: Vascular;  Laterality: Right;       Family History  Problem Relation Age of Onset  . Heart attack Father   . Heart disease Father   . Diabetes Mother   . Congestive Heart Failure Mother   . Heart disease Sister   . Heart disease Brother   . Heart disease Sister   . Colon cancer Neg Hx   . Liver disease Neg Hx     Social History   Tobacco Use  . Smoking status: Former Smoker    Packs/day: 1.00    Years: 39.00    Pack years: 39.00    Types: Cigarettes    Start date: 07/11/1975    Quit date: 04/26/2014    Years since quitting: 6.0  . Smokeless tobacco: Never Used  . Tobacco comment: Quit x 3 years  Vaping Use  . Vaping Use: Never used  Substance Use Topics  . Alcohol use: No    Alcohol/week: 0.0 standard drinks  . Drug use: No    Home Medications Prior to Admission medications   Medication Sig Start Date End Date Taking? Authorizing Provider  clopidogrel (PLAVIX) 75 MG tablet TAKE 1 TABLET BY MOUTH EVERY DAY 05/11/20   Evelina Dun A, FNP  alendronate (FOSAMAX) 70 MG tablet Take 70 mg by mouth once a week.  09/12/19   [provider]  Ascorbic Acid (VITAMIN C) 1000 MG tablet Take 500 mg by mouth daily.     [provider]  aspirin 81 MG tablet Take 81 mg by mouth daily.    [provider]  atorvastatin (LIPITOR) 80 MG tablet Take 1 tablet (80 mg total) by mouth daily. 04/19/20 05/14/2020  Cherylann Ratel A, DO  benzonatate (TESSALON PERLES) 100 MG capsule Take 1 capsule (100 mg total) by mouth 3 (three) times daily as needed for cough. 05/18/20   Ivy Lynn, NP  Calcium Carb-Cholecalciferol  (CALCIUM-VITAMIN D) 600-400 MG-UNIT TABS Take 2 tablets by mouth daily. 1200mg  daily    [provider]  folic acid (FOLVITE) 1 MG tablet Take 1 mg by mouth daily.    [provider]  Methotrexate Sodium (METHOTREXATE, PF,) 50 MG/2ML injection Inject 12.5 mg into the muscle once a week.  09/12/17   [provider]  nitroGLYCERIN (NITROSTAT) 0.4 MG SL tablet Place 1 tablet (0.4 mg total) under the tongue every 5 (five) minutes x 3 doses as needed for up to 30 doses for chest pain. 04/19/20   Cherylann Ratel A, DO  predniSONE (DELTASONE) 10 MG tablet Take 10 mg by mouth daily. 04/01/20   [provider]  Tofacitinib Citrate ER (XELJANZ XR) 11 MG TB24 Take 11 mg by mouth daily.    [provider]  traMADol (ULTRAM) 50 MG tablet Take 1 tablet (50 mg total) by mouth every 6 (six) hours as needed.  Patient taking differently: Take 50-100 mg by mouth every 6 (six) hours as needed for moderate pain.  08/31/17   Milton Ferguson, MD  traZODone (DESYREL) 50 MG tablet Take 50 mg by mouth at bedtime as needed for sleep.     [provider]    Allergies    Patient has no known allergies.  Review of Systems   Review of Systems  Constitutional: Positive for chills, fatigue and fever.  HENT: Positive for congestion.   Respiratory: Positive for cough and shortness of breath.   Cardiovascular: Positive for chest pain. Negative for leg swelling.  Gastrointestinal: Positive for diarrhea. Negative for abdominal pain, blood in stool and vomiting.  Genitourinary: Negative for dysuria.  Neurological: Negative for syncope.  All other systems reviewed and are negative.   Physical Exam Updated Vital Signs BP 95/61 (BP Location: Right Arm)   Pulse (!) 117   Temp 98.4 F (36.9 C) (Oral)   Resp (!) 22   SpO2 93%   Physical Exam Vitals and nursing note reviewed.  Constitutional:      Appearance: He is well-developed. He is ill-appearing.  HENT:     Head:  Normocephalic and atraumatic.  Eyes:     General:        Right eye: No discharge.        Left eye: No discharge.     Conjunctiva/sclera: Conjunctivae normal.  Cardiovascular:     Rate and Rhythm: Regular rhythm. Tachycardia present.  Pulmonary:     Effort: No respiratory distress.     Breath sounds: Rhonchi (bibasilar) present.     Comments: SpO2 87% on RA with tachypnea, improved with application of 4L via [place].  Abdominal:     General: There is no distension.     Palpations: Abdomen is soft.     Tenderness: There is no abdominal tenderness.  Musculoskeletal:     Cervical back: Neck supple.     Right lower leg: No tenderness. No edema.     Left lower leg: No tenderness. No edema.  Skin:    General: Skin is warm and dry.     Findings: No rash.  Neurological:     Mental Status: He is alert.     Comments: Clear speech.   Psychiatric:        Behavior: Behavior normal.     ED Results / Procedures / Treatments   Labs (all labs ordered are listed, but only abnormal results are displayed) Labs Reviewed  SARS CORONAVIRUS 2 BY RT PCR ([facility]) - Abnormal; Notable for the following components:      Result Value   SARS Coronavirus 2 POSITIVE (*)    All other components within normal limits  LACTIC ACID, PLASMA - Abnormal; Notable for the following components:   Lactic Acid, Venous 5.3 (*)    All other components within normal limits  COMPREHENSIVE METABOLIC PANEL - Abnormal; Notable for the following components:   Potassium 5.2 (*)    CO2 19 (*)    Glucose, Bld 132 (*)    BUN 34 (*)    Creatinine, Ser 1.81 (*)    Calcium 8.3 (*)    Albumin 2.8 (*)    AST 90 (*)    ALT 55 (*)    GFR calc non Af Amer 39 (*)    GFR calc Af Amer 46 (*)    All other components within normal limits  D-DIMER, QUANTITATIVE (NOT AT [facility]) - Abnormal;  Notable for the following components:   D-Dimer, Quant 4.94 (*)    All other components within normal  limits  LACTATE DEHYDROGENASE - Abnormal; Notable for the following components:   LDH 743 (*)    All other components within normal limits  FIBRINOGEN - Abnormal; Notable for the following components:   Fibrinogen 683 (*)    All other components within normal limits  CBG MONITORING, ED - Abnormal; Notable for the following components:   Glucose-Capillary 156 (*)    All other components within normal limits  TROPONIN I (HIGH SENSITIVITY) - Abnormal; Notable for the following components:   Troponin I (High Sensitivity) 324 (*)    All other components within normal limits  CULTURE, BLOOD (ROUTINE X 2)  CULTURE, BLOOD (ROUTINE X 2)  TRIGLYCERIDES  LACTIC ACID, PLASMA  CBC WITH DIFFERENTIAL/PLATELET  PROCALCITONIN  FERRITIN  C-REACTIVE PROTEIN  TROPONIN I (HIGH SENSITIVITY)    EKG EKG Interpretation  Date/Time:  Tuesday May 19 2020 11:02:57 EDT Ventricular Rate:  116 PR Interval:  152 QRS Duration: 92 QT Interval:  302 QTC Calculation: 419 R Axis:   102 Text Interpretation: Sinus tachycardia Rightward axis Nonspecific T wave abnormality Abnormal ECG Confirmed by Quintella Reichert [id number]) on 04/29/2020 12:29:23 PM   Radiology DG Chest Portable 1 View  Result Date: 05/06/2020 CLINICAL DATA:  Fever, shortness of breath, COVID-19 positive EXAM: PORTABLE CHEST 1 VIEW COMPARISON:  04/09/2020 FINDINGS: Cardiomegaly. Chronically increased interstitial markings within the peripheral and basilar aspects of the bilateral lung fields. No definite new superimposed airspace consolidation. No pleural effusion or pneumothorax. IMPRESSION: Similar appearance of chronic bilateral interstitial thickening without definite new superimposed airspace consolidation. Electronically Signed   By: Davina Poke D.O.   On: 04/29/2020 13:04    Procedures .Critical Care Performed by: Amaryllis Dyke, PA-C Authorized by: Amaryllis Dyke, PA-C    CRITICAL CARE Performed by: Kennith Maes   Total critical care time: 45 minutes  Critical care time was exclusive of separately billable procedures and treating other patients.  Critical care was necessary to treat or prevent imminent or life-threatening deterioration.  Critical care was time spent personally by me on the following activities: development of treatment plan with patient and/or surrogate as well as nursing, discussions with consultants, evaluation of patient's response to treatment, examination of patient, obtaining history from patient or surrogate, ordering and performing treatments and interventions, ordering and review of laboratory studies, ordering and review of radiographic studies, pulse oximetry and re-evaluation of patient's condition.    (including critical care time)  Medications Ordered in ED Medications  albuterol (PROVENTIL) (2.5 MG/3ML) 0.083% nebulizer solution 5 mg (has no administration in time range)    ED Course  I have reviewed the triage vital signs and the nursing notes.  Pertinent labs & imaging results that were available during my care of the patient were reviewed by me and considered in my medical decision making (see chart for details).  Ronald Cameron was evaluated in Emergency Department on 05/19/2020 for the symptoms described in the history of present illness. He/she was evaluated in the context of the global COVID-19 pandemic, which necessitated consideration that the patient might be at risk for infection with the SARS-CoV-2 virus that causes COVID-19. Institutional protocols and algorithms that pertain to the evaluation of patients at risk for COVID-19 are in a state of rapid change based on information released by regulatory bodies including the CDC and federal and state organizations. These policies and algorithms were  followed during the patient's care in the ED.    MDM Rules/Calculators/A&P                         Patient presents to the ED with complaints  of generally not feeling well with primarily respiratory & constitutional complaints. On my assessment patient is tachycardic & hypoxic requiring 4L via [place] to maintain SpO2 > 90%. He has rhonchi at the bases. No peripheral edema.   His COVID test per triage is POSITIVE.  Fluids, decadron, remdesivir per pharmacy, and albuterol ordered.   Additional history obtained:  Additional history obtained from chart review & nursing note review. Previous records obtained and reviewed.   EKG: Sinus tachycardia Rightward axis Nonspecific T wave abnormality Abnormal   Lab Tests:  I Ordered, reviewed, and interpreted labs, which included:  CBC: Mild leukocytosis, hgb/hct elevated suspect secondary to dehydartion.  CMP: Mild hyperkalemia with AKI BUN/creatinine each elevated. Transaminitis- suspect COVID related.  D-dimer, LDH, fibrinogen,--> elevated Lactic acid 5.3---> while lactic acid is > 4, feel he is more so dehydrated and that his presentation is secondary to covid 19 as opposed to sepsis, BP > 480 systolic & MAP > 65, will continue to give small fluids boluses- additional 500 ccs ordered & monitor per discussion with Dr. Ralene Bathe.  Troponin: Elevated @ 324, EKG with nonspecific T wave changes, patient's chest pain is intermittent lasting a few minutes and pleuritic felt to be less likely ACS, possibly demand- will obtain CTA to evaluate for PE.   Imaging Studies ordered:  I ordered imaging studies which included CXR & CTA, I independently visualized and interpreted imaging which showed Similar appearance of chronic bilateral interstitial thickening without definite new superimposed airspace consolidation on CXR, CTA pending.   Patient with acute hypoxic respiratory failure requiring 4L via [place], likely dehydrated receiving small fluid boluses with close monitoring as lactic acidosis felt to be more dehydration/COVID related as opposed to sepsis (procalcitonin pending), AKI present, elevated troponin with CTA  pending suspected to be demand related. Will discuss w/ hospitalist service for admission.   This is a shared visit with supervising physician Dr. Ralene Bathe who has independently evaluated patient & provided guidance in evaluation/management/disposition, in agreement with care   15:14: CONSULT: Discussed case with hospitalist Dr. Doristine Bosworth- accepts admission.   Portions of this note were generated with [employer]. Dictation errors may occur despite best attempts at proofreading.  Final Clinical Impression(s) / ED Diagnoses Final diagnoses:  [id number]  Acute hypoxemic respiratory failure [facility])    Rx / DC Orders ED Discharge Orders    None       Amaryllis Dyke, PA-C 05/02/2020 1700    Quintella Reichert, MD 05/15/2020 [phone]

## 2020-05-19 NOTE — ED Notes (Signed)
Patient transported to CT 

## 2020-05-19 NOTE — H&P (Addendum)
History and Physical    Ronald Cameron WCH:[id number] DOB: September 02, 1956 DOA: 05/04/2020  PCP: Sharion Balloon, FNP  Patient coming from: home I have personally briefly reviewed patient's old medical records in [place]  Chief Complaint: Shortness of breath and fever since 1 week HPI: Ronald Cameron is a 64 y.o. 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  Patient reports that he is not vaccinated against COVID-19.  Reports he has fever of 100.9, chills, generalized weakness, decreased p.o. intake, loose stool, cough, congestion, exertional dyspnea, chest pain with cough and deep breath, body ache, lethargy since 1 week however due to persistent  symptoms his PCP advised him to go to ER for further evaluation and management.  No history of known Covid exposure.  Denies wheezing, leg swelling, orthopnea, PND, headache, blurry vision, lightheadedness, dizziness, nausea, vomiting, abdominal pain, urinary symptoms, history of DVT/PE, leg swelling, erythema.  No history of smoking, alcohol, listed drug use.  ED Course: Upon arrival to ED: Patient tachycardic, tachypneic, hypoxic requiring 4 L of oxygen via nasal cannula, COVID-19 positive.  Blood pressure on lower side, CBC shows leukocytosis of 12.0, lactic acid: 5.3, CMP shows potassium of 5.2, AKI, AST: 90, ALT: 55, D-dimer: 4.94, troponin: 324.  Patient received 1000 cc of IV fluid bolus, remdesivir, Decadron in ED.  CT angio of chest was ordered and is pending.  [facility] hospitalist consulted for admission for acute hypoxemic respiratory failure secondary to COVID-19 pneumonia.  Review of Systems: As per HPI otherwise negative.    Past Medical History:  Diagnosis  Date  . Arthritis   . Bowel obstruction ([place])   . Cataract   . Collagen vascular disease (HCC)    Rheumatoid Arthritis  . Colon polyps   . Diverticulosis   . Kidney stones   . Rheumatoid arthritis [facility])     Past Surgical History:  Procedure Laterality Date  . CATARACT EXTRACTION, BILATERAL    . COLONOSCOPY N/A 04/13/2016   Dr. [name]: 14 mm polyp in the ascending colon removed, diverticulosis in the distal descending colon and sigmoid colon, nonbleeding internal hemorrhoids. Pathology-tubular adenoma. Next colonoscopy 5 years.  . ELBOW SURGERY     X 3 last year. Staph infection but not MRSA per patient. infectious disease. out of work 6 months.   . ENDOVASCULAR STENT INSERTION Left 04/15/2020   Procedure: INSERTION OF ENDOVASCULAR STENT INTO THE LEFT INTERNAL COMMON CAROTID ARTERY;  Surgeon: Elam Dutch, MD;  Location: [place];  Service: Vascular;  Laterality: Left;  . INTRAVASCULAR PRESSURE WIRE/FFR STUDY N/A 04/10/2020   Procedure: INTRAVASCULAR PRESSURE WIRE/FFR STUDY;  Surgeon: Martinique, Peter M, MD;  Location: [place] CV LAB;  Service: Cardiovascular;  Laterality: N/A;  . LEFT HEART CATH AND CORONARY ANGIOGRAPHY N/A 04/10/2020   Procedure: LEFT HEART CATH AND CORONARY ANGIOGRAPHY;  Surgeon: Martinique, Peter M, MD;  Location: [place] CV LAB;  Service: Cardiovascular;  Laterality: N/A;  . TONSILLECTOMY    . TRANSCAROTID ARTERY REVASCULARIZATION Left 04/15/2020   Procedure: TRANSCAROTID ARTERY REVASCULARIZATION;  Surgeon: Elam Dutch, MD;  Location: [place];  Service: Vascular;  Laterality: Left;  . ULTRASOUND GUIDANCE FOR VASCULAR ACCESS Right 04/15/2020   Procedure: ULTRASOUND GUIDANCE FOR VASCULAR ACCESS;  Surgeon: Elam Dutch, MD;  Location: [place];  Service: Vascular;  Laterality: Right;     reports that he quit smoking about 6 years ago. His smoking use included cigarettes. He started smoking about 44 years ago. He has a 39.00 pack-year smoking history. He has never  used smokeless tobacco. He reports that he does not drink alcohol and does not use drugs.  No Known Allergies  Family History  Problem Relation Age of Onset  . Heart attack Father   . Heart disease Father   . Diabetes Mother   . Congestive Heart Failure Mother   . Heart disease Sister   . Heart disease Brother   . Heart disease Sister   . Colon cancer Neg Hx   . Liver disease Neg Hx     Prior to Admission medications   Medication Sig Start Date End Date Taking? Authorizing Provider  alendronate (FOSAMAX) 70 MG tablet Take 70 mg by mouth once a week. Pt takes on Sunday 09/12/19  Yes [provider]  Ascorbic Acid (VITAMIN C) 1000 MG tablet Take 500 mg by mouth daily.    Yes [provider]  aspirin 81 MG tablet Take 81 mg by mouth daily.   Yes [provider]  atorvastatin (LIPITOR) 80 MG tablet Take 1 tablet (80 mg total) by mouth daily. 04/19/20 05/10/2020 Yes Kyle, Tyrone A, DO  benzonatate (TESSALON PERLES) 100 MG capsule Take 1 capsule (100 mg total) by mouth 3 (three) times daily as needed for cough. 05/18/20  Yes Ivy Lynn, NP  Calcium Carb-Cholecalciferol (CALCIUM-VITAMIN D) 600-400 MG-UNIT TABS Take 2 tablets by mouth daily. 1200mg  daily   Yes [provider]  clopidogrel (PLAVIX) 75 MG tablet TAKE 1 TABLET BY MOUTH EVERY DAY 05/11/20  Yes Hawks, Christy A, FNP  folic acid (FOLVITE) 1 MG tablet Take 1 mg by mouth daily.   Yes [provider]  nitroGLYCERIN (NITROSTAT) 0.4 MG SL tablet Place 1 tablet (0.4 mg total) under the tongue every 5 (five) minutes x 3 doses as needed for up to 30 doses for chest pain. 04/19/20  Yes Kyle, Tyrone A, DO  predniSONE (DELTASONE) 10 MG tablet Take 10 mg by mouth daily. 04/01/20  Yes [provider]  Tofacitinib Citrate ER (XELJANZ XR) 11 MG TB24 Take 11 mg by mouth daily.   Yes [provider]  traMADol (ULTRAM) 50 MG tablet Take 1 tablet (50 mg total) by mouth every 6 (six) hours as  needed. Patient taking differently: Take 50-100 mg by mouth every 6 (six) hours as needed for moderate pain.  08/31/17  Yes Milton Ferguson, MD  traZODone (DESYREL) 50 MG tablet Take 50 mg by mouth at bedtime as needed for sleep.    Yes [provider]  Methotrexate Sodium (METHOTREXATE, PF,) 50 MG/2ML injection Inject 12.5 mg into the muscle once a week. Pt takes on Sunday 09/12/17   [provider]    Physical Exam: Vitals:   04/28/2020 1415 05/18/2020 1430 05/04/2020 1445 05/22/2020 1500  BP: 103/79 96/79 102/76 103/74  Pulse: (!) 102 98 93 87  Resp: (!) 22 (!) 22 (!) 27 18  Temp:      TempSrc:      SpO2: (!) 89% 93% 93% 96%    Constitutional: NAD, calm, comfortable, on 4 L of oxygen via nasal cannula, obese, communicating well Eyes: PERRL, lids and conjunctivae normal ENMT: Mucous membranes are moist. Posterior pharynx clear of any exudate or lesions.Normal dentition.  Neck: normal, supple, no masses, no thyromegaly Respiratory: Tachypneic.  No wheezing, rhonchi.  Cardiovascular: Tachycardic, no murmurs / rubs / gallops. No extremity edema. 2+ pedal pulses. No carotid bruits.  Abdomen: no tenderness, no masses palpated. No hepatosplenomegaly. Bowel sounds positive.  Musculoskeletal: no clubbing / cyanosis. No joint deformity upper and lower extremities. Good ROM, no contractures. Normal muscle tone.  Skin: no rashes, lesions, ulcers. No induration Neurologic: CN 2-12 grossly intact. Sensation intact, DTR normal. Strength 5/5 in all 4.  Psychiatric: Normal judgment and insight. Alert and oriented x 3. Normal mood.    Labs on Admission: I have personally reviewed following labs and imaging studies  CBC: Recent Labs  Lab 05/15/2020 1246  WBC 12.0*  NEUTROABS 11.9*  HGB 19.3*  HCT 60.1*  MCV 99.3  PLT 742   Basic Metabolic Panel: Recent Labs  Lab 05/26/2020 1246  NA 140  K 5.2*  CL 107  CO2 19*  GLUCOSE 132*  BUN 34*  CREATININE 1.81*  CALCIUM 8.3*    GFR: CrCl cannot be calculated (Unknown ideal weight.). Liver Function Tests: Recent Labs  Lab 05/23/2020 1246  AST 90*  ALT 55*  ALKPHOS 40  BILITOT 0.6  PROT 7.3  ALBUMIN 2.8*   No results for input(s): LIPASE, AMYLASE in the last 168 hours. No results for input(s): AMMONIA in the last 168 hours. Coagulation Profile: No results for input(s): INR, PROTIME in the last 168 hours. Cardiac Enzymes: No results for input(s): CKTOTAL, CKMB, CKMBINDEX, TROPONINI in the last 168 hours. BNP (last 3 results) No results for input(s): PROBNP in the last 8760 hours. HbA1C: No results for input(s): HGBA1C in the last 72 hours. CBG: Recent Labs  Lab 05/16/2020 1132  GLUCAP 156*   Lipid Profile: Recent Labs    05/13/2020 1246  TRIG 133   Thyroid Function Tests: No results for input(s): TSH, T4TOTAL, FREET4, T3FREE, THYROIDAB in the last 72 hours. Anemia Panel: No results for input(s): VITAMINB12, FOLATE, FERRITIN, TIBC, IRON, RETICCTPCT in the last 72 hours. Urine analysis:    Component Value Date/Time   COLORURINE YELLOW 02/03/2018 2151   APPEARANCEUR CLEAR 02/03/2018 2151   LABSPEC 1.027 02/03/2018 2151   PHURINE 6.0 02/03/2018 2151   GLUCOSEU NEGATIVE 02/03/2018 2151   HGBUR MODERATE (A) 02/03/2018 2151   BILIRUBINUR NEGATIVE 02/03/2018 2151   KETONESUR 5 (A) 02/03/2018 2151   PROTEINUR 30 (A) 02/03/2018 2151   NITRITE NEGATIVE 02/03/2018 2151   LEUKOCYTESUR NEGATIVE 02/03/2018 2151    Radiological Exams on Admission: DG Chest Portable 1 View  Result Date: 05/22/2020 CLINICAL DATA:  Fever, shortness of breath, COVID-19 positive EXAM: PORTABLE CHEST 1 VIEW COMPARISON:  04/09/2020 FINDINGS: Cardiomegaly. Chronically increased interstitial markings within the peripheral and basilar aspects of the bilateral lung fields. No definite new superimposed airspace consolidation. No pleural effusion or pneumothorax. IMPRESSION: Similar appearance of chronic bilateral interstitial  thickening without definite new superimposed airspace consolidation. Electronically Signed   By: Davina Poke D.O.   On: 05/25/2020 13:04    EKG: Independently reviewed.  Sinus tachycardia, rightward axis deviation.  Nonspecific T wave abnormality.    Assessment/Plan Principal Problem:   Acute hypoxemic respiratory failure due to COVID-19 [facility]) Active Problems:   Rheumatoid arthritis (HCC)   Hyperlipidemia   Sepsis (HCC)   Hyperkalemia   Elevated liver enzymes   AKI (acute kidney injury) (HCC)   Elevated troponin   Severe Sepsis/acute hypoxemic respiratory failure due to COVID-19 pneumonia: Patient tachycardic, tachypneic, hypoxemic requiring 4 L of oxygen via nasal cannula, COVID-19 positive.  Blood pressure on lower side.  Lactic  acid: 5.3, CMP shows AKI. -Reviewed chest x-ray.  Patient received remdesivir and Decadron and 1000 cc of IV fluid bolus -Admit patient stepdown unit for close monitoring.  On continuous pulse ox.  We will try to wean off of oxygen as tolerated. -Continue remdesivir.  Start patient on IV Solu-Medrol.  Start p.o. vitamin/antitussive. -Inflammatory markers elevated.  D-dimer: 4.94 -CT angio of chest is ordered and is pending. -Start patient on azithromycin and Rocephin. -Repeat inflammatory markers tomorrow AM. -Patient was told that if COVID-19 pneumonitis gets worse we might potentially use Actemra off label, he denies known history of tuberculosis or hepatitis and wants to proceed with Actemra treatment if required. -Monitor vitals closely.  AKI: Likely secondary to dehydration secondary to decreased p.o. intake: -Received IV fluid in ED.  Avoid nephrotoxic medication. -Repeat BMP tomorrow a.m.  Hyperkalemia: Likely secondary to AKI -Potassium: 5.2.  Continue IV fluids -We will give a dose of Lokelma.  Repeat BMP tomorrow a.m.  Reviewed EKG.  Elevated troponin: Likely secondary to demand ischemia secondary to COVID-19 infection. -Trend troponin.   Reviewed EKG. nitro as needed for chest pain. -Get CT angio of chest to rule out PE.  Elevated liver enzymes:  -Likely in the setting of COVID-19 infection.  AST: 90, ALT: 55 -Repeat CMP tomorrow AM.  Rheumatoid arthritis: On methotrexate, Xeljanz and prednisone at home. -Discussed with pharmacy.  Continue methotrexate and Xeljanz.  CVA: With no residual weakness. -Continue aspirin, statin, Plavix  Carotid artery stenosis: -S/p revascularization of left carotid artery in July 21. -Continue aspirin, statin, Plavix.  Hyperlipidemia: Continue statin  DVT prophylaxis: Lovenox/SCD Code Status: Full code Family Communication: None present at bedside.  Plan of care discussed with patient in length and he verbalized understanding and agreed with it. Disposition Plan: Home in 3 to 4 days Consults called: None Admission status: Inpatient   Mckinley Jewel MD [facility]  If 7PM-7AM, please contact night-coverage [email or web address] Password [email or web address]  04/30/2020, 3:20 PM

## 2020-05-20 ENCOUNTER — Ambulatory Visit: Payer: PPO | Admitting: Student

## 2020-05-20 ENCOUNTER — Inpatient Hospital Stay (HOSPITAL_COMMUNITY): Payer: PPO

## 2020-05-20 DIAGNOSIS — N179 Acute kidney failure, unspecified: Secondary | ICD-10-CM

## 2020-05-20 DIAGNOSIS — R748 Abnormal levels of other serum enzymes: Secondary | ICD-10-CM

## 2020-05-20 LAB — CBC WITH DIFFERENTIAL/PLATELET
Abs Immature Granulocytes: 0 10*3/uL (ref 0.00–0.07)
Basophils Absolute: 0 10*3/uL (ref 0.0–0.1)
Basophils Relative: 0 %
Eosinophils Absolute: 0 10*3/uL (ref 0.0–0.5)
Eosinophils Relative: 0 %
HCT: 54.9 % — ABNORMAL HIGH (ref 39.0–52.0)
Hemoglobin: 17.6 g/dL — ABNORMAL HIGH (ref 13.0–17.0)
Lymphocytes Relative: 0 %
Lymphs Abs: 0 10*3/uL — ABNORMAL LOW (ref 0.7–4.0)
MCH: 30.8 pg (ref 26.0–34.0)
MCHC: 32.1 g/dL (ref 30.0–36.0)
MCV: 96.1 fL (ref 80.0–100.0)
Monocytes Absolute: 0.1 10*3/uL (ref 0.1–1.0)
Monocytes Relative: 1 %
Neutro Abs: 11.2 10*3/uL — ABNORMAL HIGH (ref 1.7–7.7)
Neutrophils Relative %: 99 %
Platelets: 232 10*3/uL (ref 150–400)
RBC: 5.71 MIL/uL (ref 4.22–5.81)
RDW: 15.3 % (ref 11.5–15.5)
WBC: 11.3 10*3/uL — ABNORMAL HIGH (ref 4.0–10.5)
nRBC: 0 % (ref 0.0–0.2)
nRBC: 0 /100 WBC

## 2020-05-20 LAB — COMPREHENSIVE METABOLIC PANEL
ALT: 43 U/L (ref 0–44)
AST: 83 U/L — ABNORMAL HIGH (ref 15–41)
Albumin: 2.3 g/dL — ABNORMAL LOW (ref 3.5–5.0)
Alkaline Phosphatase: 41 U/L (ref 38–126)
Anion gap: 15 (ref 5–15)
BUN: 36 mg/dL — ABNORMAL HIGH (ref 8–23)
CO2: 19 mmol/L — ABNORMAL LOW (ref 22–32)
Calcium: 7.6 mg/dL — ABNORMAL LOW (ref 8.9–10.3)
Chloride: 107 mmol/L (ref 98–111)
Creatinine, Ser: 1.56 mg/dL — ABNORMAL HIGH (ref 0.61–1.24)
GFR calc Af Amer: 55 mL/min — ABNORMAL LOW (ref >60)
GFR calc non Af Amer: 47 mL/min — ABNORMAL LOW (ref >60)
Glucose, Bld: 170 mg/dL — ABNORMAL HIGH (ref 70–99)
Potassium: 5.3 mmol/L — ABNORMAL HIGH (ref 3.5–5.1)
Sodium: 141 mmol/L (ref 135–145)
Total Bilirubin: 0.3 mg/dL (ref 0.3–1.2)
Total Protein: 6.3 g/dL — ABNORMAL LOW (ref 6.5–8.1)

## 2020-05-20 LAB — TROPONIN I (HIGH SENSITIVITY): Troponin I (High Sensitivity): 275 ng/L (ref <18)

## 2020-05-20 LAB — PHOSPHORUS: Phosphorus: 3.9 mg/dL (ref 2.5–4.6)

## 2020-05-20 LAB — D-DIMER, QUANTITATIVE: D-Dimer, Quant: 4.36 ug/mL-FEU — ABNORMAL HIGH (ref 0.00–0.50)

## 2020-05-20 LAB — C-REACTIVE PROTEIN: CRP: 13.7 mg/dL — ABNORMAL HIGH (ref <1.0)

## 2020-05-20 LAB — FERRITIN: Ferritin: 2484 ng/mL — ABNORMAL HIGH (ref 24–336)

## 2020-05-20 LAB — MAGNESIUM: Magnesium: 2.1 mg/dL (ref 1.7–2.4)

## 2020-05-20 MED ORDER — AEROCHAMBER PLUS FLO-VU LARGE MISC
Status: AC
  Filled 2020-05-20: qty 1

## 2020-05-20 MED ORDER — SODIUM ZIRCONIUM CYCLOSILICATE 10 G PO PACK
10.0000 g | PACK | Freq: Once | ORAL | Status: AC
  Administered 2020-05-20: 10 g via ORAL
  Filled 2020-05-20: qty 1

## 2020-05-20 MED ORDER — PANTOPRAZOLE SODIUM 40 MG PO TBEC
40.0000 mg | DELAYED_RELEASE_TABLET | Freq: Every day | ORAL | Status: DC
  Administered 2020-05-20: 40 mg via ORAL
  Filled 2020-05-20 (×3): qty 1

## 2020-05-20 MED ORDER — TOFACITINIB CITRATE ER 11 MG PO TB24: 11.0000 mg | ORAL_TABLET | Freq: Every day | ORAL | Status: DC

## 2020-05-21 ENCOUNTER — Encounter: Payer: PPO | Admitting: Vascular Surgery

## 2020-05-21 ENCOUNTER — Other Ambulatory Visit: Payer: Self-pay | Admitting: Family

## 2020-05-21 ENCOUNTER — Inpatient Hospital Stay: Payer: PPO | Admitting: Adult Health

## 2020-05-21 ENCOUNTER — Encounter (HOSPITAL_COMMUNITY): Payer: PPO

## 2020-05-21 DIAGNOSIS — R7989 Other specified abnormal findings of blood chemistry: Secondary | ICD-10-CM

## 2020-05-21 DIAGNOSIS — Z8673 Personal history of transient ischemic attack (TIA), and cerebral infarction without residual deficits: Secondary | ICD-10-CM

## 2020-05-21 MED FILL — Medication: Qty: 1 | Status: AC

## 2020-05-24 LAB — CULTURE, BLOOD (ROUTINE X 2): Culture: NO GROWTH

## 2020-05-27 NOTE — ED Notes (Signed)
Per provider keep epi off.  Provider is calling wife to come visit.  Pt is now a DNR.  No more compressions should his heart stop.  Will continue to keep course

## 2020-05-27 NOTE — ED Notes (Signed)
Time of death - 01-14-14

## 2020-05-27 NOTE — ED Notes (Signed)
Pt's wife is here, provider aware. [place] called.

## 2020-05-27 NOTE — ED Notes (Signed)
2nd epi given [phone] - pulse check , no pulse, resume CPR Provider starting to intubate - success 3rd epi given [phone] - pulse check - pt has pulse

## 2020-05-27 NOTE — ED Provider Notes (Signed)
Department of Emergency Medicine   Code Blue CONSULT NOTE  Chief Complaint: Cardiac arrest/unresponsive   Level V Caveat: Unresponsive  History of present illness: I was contacted by the ED nurse for a CODE BLUE cardiac arrest in the ED and presented to the patient's bedside.   ROS: Unable to obtain, Level V caveat  Scheduled Meds: . AeroChamber Plus Flo-Vu Large  1 each Other Once  . albuterol  2 puff Inhalation Q6H  . vitamin C  500 mg Oral Daily  . aspirin EC  81 mg Oral Daily  . atorvastatin  80 mg Oral Daily  . clopidogrel  75 mg Oral Daily  . enoxaparin (LOVENOX) injection  40 mg Subcutaneous Q24H  . folic acid  1 mg Oral Daily  . methylPREDNISolone (SOLU-MEDROL) injection  50 mg Intravenous Q12H  . pantoprazole  40 mg Oral Daily  . [START ON 05/18/2020] Tofacitinib Citrate ER  11 mg Oral Daily  . zinc sulfate  220 mg Oral Daily   Continuous Infusions: . azithromycin Stopped (06-11-20 2031)  . cefTRIAXone (ROCEPHIN)  IV Stopped (11-Jun-2020 2031)  . remdesivir 100 mg in NS 100 mL Stopped (06/11/2020 1007)   PRN Meds:.acetaminophen, chlorpheniramine-HYDROcodone, guaiFENesin-dextromethorphan, nitroGLYCERIN, ondansetron **OR** ondansetron (ZOFRAN) IV, traZODone Past Medical History:  Diagnosis Date  . Arthritis   . Bowel obstruction ([place])   . Cataract   . Collagen vascular disease (HCC)    Rheumatoid Arthritis  . Colon polyps   . Diverticulosis   . Kidney stones   . Rheumatoid arthritis [facility])    Past Surgical History:  Procedure Laterality Date  . CATARACT EXTRACTION, BILATERAL    . COLONOSCOPY N/A 04/13/2016   Dr. [name]: 14 mm polyp in the ascending colon removed, diverticulosis in the distal descending colon and sigmoid colon, nonbleeding internal hemorrhoids. Pathology-tubular adenoma. Next colonoscopy 5 years.  . ELBOW SURGERY     X 3 last year. Staph infection but not MRSA per patient. infectious disease. out of work 6 months.   . ENDOVASCULAR STENT INSERTION  Left 04/15/2020   Procedure: INSERTION OF ENDOVASCULAR STENT INTO THE LEFT INTERNAL COMMON CAROTID ARTERY;  Surgeon: Elam Dutch, MD;  Location: [place];  Service: Vascular;  Laterality: Left;  . INTRAVASCULAR PRESSURE WIRE/FFR STUDY N/A 04/10/2020   Procedure: INTRAVASCULAR PRESSURE WIRE/FFR STUDY;  Surgeon: Martinique, Peter M, MD;  Location: [place] CV LAB;  Service: Cardiovascular;  Laterality: N/A;  . LEFT HEART CATH AND CORONARY ANGIOGRAPHY N/A 04/10/2020   Procedure: LEFT HEART CATH AND CORONARY ANGIOGRAPHY;  Surgeon: Martinique, Peter M, MD;  Location: [place] CV LAB;  Service: Cardiovascular;  Laterality: N/A;  . TONSILLECTOMY    . TRANSCAROTID ARTERY REVASCULARIZATION Left 04/15/2020   Procedure: TRANSCAROTID ARTERY REVASCULARIZATION;  Surgeon: Elam Dutch, MD;  Location: [place];  Service: Vascular;  Laterality: Left;  . ULTRASOUND GUIDANCE FOR VASCULAR ACCESS Right 04/15/2020   Procedure: ULTRASOUND GUIDANCE FOR VASCULAR ACCESS;  Surgeon: Elam Dutch, MD;  Location: [facility] OR;  Service: Vascular;  Laterality: Right;   Social History   Socioeconomic History  . Marital status: Married    Spouse name: Not on file  . Number of children: 0  . Years of education: Not on file  . Highest education level: 12th grade  Occupational History  . Occupation: Disbility  Tobacco Use  . Smoking status: Former Smoker    Packs/day: 1.00    Years: 39.00    Pack years: 39.00    Types: Cigarettes  Start date: 07/11/1975    Quit date: 04/26/2014    Years since quitting: 6.0  . Smokeless tobacco: Never Used  . Tobacco comment: Quit x 3 years  Vaping Use  . Vaping Use: Never used  Substance and Sexual Activity  . Alcohol use: No    Alcohol/week: 0.0 standard drinks  . Drug use: No  . Sexual activity: Not Currently  Other Topics Concern  . Not on file  Social History Narrative  . Not on file   Social Determinants of Health   Financial Resource Strain:   . Difficulty of Paying  Living Expenses: Not on file  Food Insecurity:   . Worried About Charity fundraiser in the Last Year: Not on file  . Ran Out of Food in the Last Year: Not on file  Transportation Needs:   . Lack of Transportation (Medical): Not on file  . Lack of Transportation (Non-Medical): Not on file  Physical Activity:   . Days of Exercise per Week: Not on file  . Minutes of Exercise per Session: Not on file  Stress:   . Feeling of Stress : Not on file  Social Connections:   . Frequency of Communication with Friends and Family: Not on file  . Frequency of Social Gatherings with Friends and Family: Not on file  . Attends Religious Services: Not on file  . Active Member of Clubs or Organizations: Not on file  . Attends Archivist Meetings: Not on file  . Marital Status: Not on file  Intimate Partner Violence:   . Fear of Current or Ex-Partner: Not on file  . Emotionally Abused: Not on file  . Physically Abused: Not on file  . Sexually Abused: Not on file   No Known Allergies  Last set of Vital Signs (not current) Vitals:   06/09/20 2030 06-09-20 2035  BP: 136/69 117/86  Pulse: (!) 207   Resp: 14 11  Temp:    SpO2: (!) 27%       Physical Exam  Gen: unresponsive Cardiovascular: pulseless  Resp: apneic. Breath sounds equal bilaterally with bagging  Abd: nondistended  Neuro: GCS 3, unresponsive to pain  HEENT: No blood in posterior pharynx, gag reflex absent  Neck: No crepitus  Musculoskeletal: No deformity  Skin: warm  Procedures   Cardiopulmonary Resuscitation (CPR) Procedure Note   Directed/Performed by: Margette Fast I personally directed ancillary staff and/or performed CPR in an effort to regain return of spontaneous circulation and to maintain cardiac, neuro and systemic perfusion.   Procedure Name: Intubation Date/Time: 2020-06-09 8:44 PM Performed by: Margette Fast, MD Pre-anesthesia Checklist: Suction available and Patient being monitored Oxygen  Delivery Method: Non-rebreather mask Ventilation: Mask ventilation without difficulty Laryngoscope Size: Glidescope and 3 Grade View: Grade III Tube size: 7.5 mm Number of attempts: 1 Airway Equipment and Method: Video-laryngoscopy Placement Confirmation: ETT inserted through vocal cords under direct vision,  CO2 detector and Breath sounds checked- equal and bilateral Secured at: 25 cm Tube secured with: ETT holder Dental Injury: Teeth and Oropharynx as per pre-operative assessment  Difficulty Due To: Difficulty was anticipated Comments: Patient was intubated with CPR in progress.  No pause and chest compressions during intubation.  No complications or difficulty with the procedure.    .Critical Care Performed by: Margette Fast, MD Authorized by: Margette Fast, MD   Critical care provider statement:    Critical care time (minutes):  35   Critical care time was exclusive of:  Separately billable procedures and treating other patients and teaching time   Critical care was necessary to treat or prevent imminent or life-threatening deterioration of the following conditions:  Respiratory failure and cardiac failure   Critical care was time spent personally by me on the following activities:  Development of treatment plan with patient or surrogate, discussions with consultants, ventilator management, ordering and performing treatments and interventions, ordering and review of laboratory studies, ordering and review of radiographic studies, pulse oximetry, re-evaluation of patient's condition and examination of patient   I assumed direction of critical care for this patient from another provider in my specialty: no       Medical Decision making  Patient has been boarding in the emergency department awaiting stepdown bed.  He has been on high levels of oxygen.  He is Covid positive.  His CODE STATUS is full code.  I responded to the bedside to find the patient without pulses and CPR in  progress.  I directed ACLS.  The decision was made to intubate the patient during CPR with presumed respiratory cause for his cardiac arrest.  After intubation and multiple rounds of epinephrine the patient regained pulses.  See code documentation for timing of doses and number.  Patient in A. fib RVR after epinephrine.  Will discuss with the ICU.  Assessment and Plan   - Portable CXR and repeat labs.  - ICU admit  09:10 PM  Had a [place] discussion with the patient's wife on the phone.  We discussed his critical status and she has decided that he would not want additional CPR or ACLS if he lost pulses again.  I have made him DNR.  She does not want to terminally extubate him she would like to keep him on the ventilator and do everything possible for him but if he were to code again she would want him to pass peacefully and not have CPR.  I have documented this in the note and discussed with nursing staff.  The wife is in route to be with the patient as I suspect he is near end-of-life.  She understands the risk of contracting COVID-19.  09:17 PM  Called back into the room by nursing staff.  Patient's blood pressure has bottomed out and he has lost pulses again.  Per discussion with wife will not begin CPR.  Time of death 21:15.   Nanda Quinton, MD Emergency Medicine    Jamal Pavon, [facility], MD 05/25/20 [phone]

## 2020-05-27 NOTE — Death Summary Note (Signed)
DEATH SUMMARY   Patient Details  Name: Ronald Cameron MRN: [id number] DOB: 1956-02-20  Admission/Discharge Information   Admit Date:  Jun 16, 2020  Date of Death: Date of Death: 17-Jun-2020  Time of Death: Time of Death: 01/13/14  Length of Stay: 2  Referring Physician: Sharion Balloon, FNP   Reason(s) for Hospitalization   Acute hypoxic respiratory failure due to COVID-19 pneumonia  Diagnoses  Preliminary cause of death:    Acute hypoxic respiratory failure due to COVID-19 pneumonia  Secondary Diagnoses (including complications and co-morbidities):  Principal Problem:   Acute hypoxemic respiratory failure due to COVID-19 [facility]) Active Problems:   Rheumatoid arthritis ([place])   Colitis   Fatty liver   Aortic atherosclerosis (HCC)   Immunosuppressed status (HCC)   Carotid stenosis   Hyperlipidemia   Sepsis ([place])   Hyperkalemia   Elevated liver enzymes   AKI (acute kidney injury) ([place])   Elevated troponin   History of CVA (cerebrovascular accident)   Elevated d-dimer   Brief Hospital Course (including significant findings, care, treatment, and services provided and events leading to death)  Ronald Cameron is a 64 y.o. year old male with past medical history significant for rheumatoid arthritis, on immunosuppressive therapy, hyperlipidemia, chronic pain syndrome, recent admission with acute CVA, with diagnosis of severe carotid artery stenosis status post left transcarotid artery revascularization on 04/15/2020, tobacco use, patient is unvaccinated against COVID-19, patient presents to ED on 2023/06/17 with complaints of fever, chills, generalized weakness, poor oral intake, loose stool, cough, congestion, and dyspnea, he did report musculoskeletal chest pain related to cough as well, for last week, PCP recommended patient to come to ED, patient with no history of COVID-19 exposure, but he is unvaccinated, work-up was significant for COVID-19 infection, will patient presents with  severe sepsis with evidence of endorgan damage with elevated lactic acid at 5.3, elevated creatinine, leukocytosis, tachycardia and tachypnea, work-up significant for severe COVID-19 infection, evidence of severe COVID-19 pneumonia bilaterally with multifocal opacities, significant ARDS/hypoxia requiring 15 L NRB/15 L high flow nasal cannula, he was started on IV Rocephin and azithromycin given his sepsis, elevated procalcitonin, he was started on IV steroids, IV remdesivir for his treatment, he was not given Actemra given elevated procalcitonin and history of colitis and he is already on immunosuppressive therapy Morrie Sheldon which is Jak inhibitor, he had elevated D-dimers, but his CTA chest with no evidence of PE, as well work-up significant for AKI, hyperkalemia with potassium of 5.3 for which he received Lokelma, he was hydrated, he had elevated troponins in the setting of demand ischemia due to hypoxemia, transaminitis were related to COVID-19 infection, patient was intermittently on BiPAP support, but he maintained good oxygen saturation on NRB and high flow nasal cannula, patient respiratory status has decompensated, for which he had respiratory arrest, where he had CPR performed, unfortunately was unsuccessful, patient passed away 06/17/2020, with time of death 21:15pm.    Pertinent Labs and Studies  Significant Diagnostic Studies CT Angio Chest PE W/Cm &/Or Wo Cm  Result Date: 06-16-20 CLINICAL DATA:  COVID positivity with shortness of breath, initial encounter EXAM: CT ANGIOGRAPHY CHEST WITH CONTRAST TECHNIQUE: Multidetector CT imaging of the chest was performed using the standard protocol during bolus administration of intravenous contrast. Multiplanar CT image reconstructions and MIPs were obtained to evaluate the vascular anatomy. CONTRAST:  40mL OMNIPAQUE IOHEXOL 350 MG/ML SOLN COMPARISON:  Chest x-ray from earlier in the same day, CT from 04/09/2020. FINDINGS: Cardiovascular: Thoracic aorta  demonstrates atherosclerotic calcifications without aneurysmal  dilatation. No significant opacification to evaluate for dissection is noted. No cardiac enlargement is seen. Coronary calcifications are noted. The pulmonary artery shows a normal branching pattern bilaterally. No filling defect to suggest pulmonary embolism is noted. Mediastinum/Nodes: Thoracic inlet is within normal limits. No sizable hilar or mediastinal adenopathy is noted. The esophagus as visualized is within normal limits. Lungs/Pleura: The lungs are well aerated bilaterally but demonstrate peripheral ground-glass opacities throughout both lungs consistent with the given clinical history of COVID-19 positivity. They appear significantly greater than that seen on the recent chest x-ray from earlier in the same day. No sizable effusion or pneumothorax is noted. No sizable parenchymal nodule is seen. Upper Abdomen: Visualized upper abdomen is within normal limits. Musculoskeletal: Degenerative changes of the thoracic spine are noted. No acute rib abnormality is seen. Chronic appearing compression deformity of T5 is noted. This is stable from the prior CT. Stable T11 compression deformity is noted as well. Review of the MIP images confirms the above findings. IMPRESSION: No evidence of pulmonary emboli. Diffuse bilateral ground-glass opacities consistent with the given clinical history of COVID-19 positivity. These are greater than that seen on recent chest x-ray. No other acute abnormality is noted. Aortic Atherosclerosis (ICD10-I70.0). Electronically Signed   By: Inez Catalina M.D.   On: 05/18/2020 19:22   DG Chest Portable 1 View  Result Date: 05/23/2020 CLINICAL DATA:  Fever, shortness of breath, COVID-19 positive EXAM: PORTABLE CHEST 1 VIEW COMPARISON:  04/09/2020 FINDINGS: Cardiomegaly. Chronically increased interstitial markings within the peripheral and basilar aspects of the bilateral lung fields. No definite new superimposed airspace  consolidation. No pleural effusion or pneumothorax. IMPRESSION: Similar appearance of chronic bilateral interstitial thickening without definite new superimposed airspace consolidation. Electronically Signed   By: Davina Poke D.O.   On:  13:04    Microbiology Recent Results (from the past 240 hour(s))  SARS Coronavirus 2 by RT PCR (hospital order, performed in [facility] hospital lab) Nasopharyngeal Nasopharyngeal Swab     Status: Abnormal   Collection Time: 04/29/2020 10:50 AM   Specimen: Nasopharyngeal Swab  Result Value Ref Range Status   SARS Coronavirus 2 POSITIVE (A) NEGATIVE Final    Comment: RESULT CALLED TO, READ BACK BY AND VERIFIED WITH: [place] RN 12:30 04/28/2020 ([email or web address]) (NOTE) SARS-CoV-2 target nucleic acids are DETECTED  SARS-CoV-2 RNA is generally detectable in upper respiratory specimens  during the acute phase of infection.  Positive results are indicative  of the presence of the identified virus, but do not rule out bacterial infection or co-infection with other pathogens not detected by the test.  Clinical correlation with patient history and  other diagnostic information is necessary to determine patient infection status.  The expected result is negative.  Fact Sheet for Patients:   StrictlyIdeas.no   Fact Sheet for Healthcare Providers:   BankingDealers.co.za    This test is not yet approved or cleared by the [place] FDA and  has been authorized for detection and/or diagnosis of SARS-CoV-2 by FDA under an Emergency Use Authorization (EUA).  This EUA will remain in effect (meaning this test  can be used) for the duration of  the COVID-19 declaration under Section 564(b)(1) of the Act, 21 U.S.C. section 360-bbb-3(b)(1), unless the authorization is terminated or revoked sooner.  Performed at [facility], [place] [address] [place], [place] [address]   Blood Culture (routine x 2)     Status:  None (Preliminary result)   Collection Time: 05/22/2020  1:15 PM   Specimen:  BLOOD RIGHT ARM  Result Value Ref Range Status   Specimen Description BLOOD RIGHT ARM  Final   Special Requests   Final    BOTTLES DRAWN AEROBIC ONLY Blood Culture results may not be optimal due to an inadequate volume of blood received in culture bottles   Culture   Final    NO GROWTH 2 DAYS Performed at [facility], [place] [address], [place], [place] [address]    Report Status PENDING  Incomplete    Lab Basic Metabolic Panel: Recent Labs  Lab 05/16/2020 1246 05/13/2020 1730 May 27, 2020 0301  NA 140 139 141  K 5.2* 5.1 5.3*  CL 107 107 107  CO2 19* 17* 19*  GLUCOSE 132* 142* 170*  BUN 34* 36* 36*  CREATININE 1.81* 1.56* 1.56*  CALCIUM 8.3* 7.7* 7.6*  MG  --   --  2.1  PHOS  --   --  3.9   Liver Function Tests: Recent Labs  Lab 05/11/2020 1246 05/10/2020 1730 2020-05-27 0301  AST 90* 87* 83*  ALT 55* 49* 43  ALKPHOS 40 37* 41  BILITOT 0.6 0.7 0.3  PROT 7.3 6.5 6.3*  ALBUMIN 2.8* 2.4* 2.3*   No results for input(s): LIPASE, AMYLASE in the last 168 hours. No results for input(s): AMMONIA in the last 168 hours. CBC: Recent Labs  Lab 05/25/2020 1246 05/14/2020 1730 2020-05-27 0301  WBC 12.0* 13.0* 11.3*  NEUTROABS 11.9* 12.7* 11.2*  HGB 19.3* 18.1* 17.6*  HCT 60.1* 56.9* 54.9*  MCV 99.3 97.9 96.1  PLT 221 227 232   Cardiac Enzymes: No results for input(s): CKTOTAL, CKMB, CKMBINDEX, TROPONINI in the last 168 hours. Sepsis Labs: Recent Labs  Lab 05/25/2020 1246 05/23/2020 1310 05/18/2020 1730 May 27, 2020 0301  PROCALCITON 1.26  --   --   --   WBC 12.0*  --  13.0* 11.3*  LATICACIDVEN  --  5.3* 5.5*  --     Procedures/Operations     [employer] 05/19/2020, 5:46 PM

## 2020-05-27 NOTE — ED Notes (Signed)
Admitting at bedside 

## 2020-05-27 NOTE — ED Notes (Signed)
Epi paused went in to vtac

## 2020-05-27 NOTE — Progress Notes (Signed)
PROGRESS NOTE                                                                                                                                                                                                             Patient Demographics:    Ronald Cameron, is a 64 y.o. male, DOB - 05-Aug-1956, XLK:[id number]  Admit date - 05/18/2020   Admitting Physician Mckinley Jewel, MD  Outpatient Primary MD for the patient is Sharion Balloon, FNP  LOS - 1   Chief Complaint  Patient presents with  . Fever  . Shortness of Breath       Brief Narrative     Ronald Cameron is a 64 y.o. 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  Patient reports that he is not vaccinated against COVID-19.  Reports he has fever of 100.9, chills, generalized weakness, decreased p.o. intake, loose stool, cough, congestion, exertional dyspnea, chest pain with cough and deep breath, body ache, lethargy since 1 week however due to persistent  symptoms his PCP advised him to go to ER for further evaluation and management.  No history of known Covid exposure.  Denies wheezing, leg swelling, orthopnea, PND, headache, blurry vision, lightheadedness, dizziness, nausea, vomiting, abdominal pain, urinary symptoms, history of DVT/PE, leg swelling, erythema.      Subjective:    Ronald Cameron today still reports dyspnea, cough, any abdominal pain, nausea or vomiting .    Assessment  & Plan :    Principal Problem:   Acute hypoxemic respiratory failure due to COVID-19 [facility]) Active Problems:   Rheumatoid arthritis (HCC)   Hyperlipidemia   Sepsis (HCC)   Hyperkalemia   Elevated liver enzymes   AKI  (acute kidney injury) (HCC)   Elevated troponin     Severe Sepsis/acute hypoxemic respiratory failure due to COVID-19 pneumonia: -Patient imaging significant with severe Covid pneumonia bilaterally, with multifocal opacities, he is with severe respiratory failure, requiring 15 L high flow nasal cannula and 15 NRB, as well he did require BiPAP briefly. -Discussed with the patient, encouraged to do incentive spirometry, flutter valve, to sit once able, and to prone once in  bed. -Procalcitonin is elevated at 1.26, lactic acid elevated at 5.3, continue with IV Rocephin and azithromycin to cover for component of community-acquired pneumonia. -Continue with IV Solu-Medrol. -Continue with IV remdesivir. -Given elevated procalcitonin and history of colitis I will hold on given Actemra, especially he is on Jak inhibitor Xeljanz.  patient tachycardic, tachypneic, hypoxemic requiring 4 L of oxygen via nasal cannula, COVID-19 positive.  Blood pressure on lower side.  Lactic acid: 5.3, CMP shows AKI.  -Continue to trend inflammatory markers closely. -D-dimers elevated on admission 4.9, CTA chest negative for PE, will check venous Doppler.  AKI: Likely secondary to dehydration secondary to decreased p.o. intake: -Received IV fluid in ED.  Avoid nephrotoxic medication.  Hyperkalemia: Likely secondary to AKI -Potassium is 5.3 today, will give Lokelma  Elevated troponin:  - Likely secondary to demand ischemia secondary to COVID-19 infection.  Elevated liver enzymes:  -Trending down  Rheumatoid arthritis: On methotrexate, Xeljanz and prednisone at home. -Discussed with pharmacy.  Continue methotrexate and Xeljanz.  CVA: With no residual weakness. -Continue aspirin, statin, Plavix  Carotid artery stenosis: -S/p revascularization of left carotid artery in July 21. -Continue aspirin, statin, Plavix.  Hyperlipidemia: Continue statin   COVID-19 Labs  Recent Labs    05/13/2020 1246  Jun 18, 2020 0301  DDIMER 4.94* 4.36*  FERRITIN 2,074* 2,484*  LDH 743*  --   CRP 11.3* 13.7*    Lab Results  Component Value Date   SARSCOV2NAA POSITIVE (A) 05/11/2020   [place] NEGATIVE 04/09/2020     Code Status : Full  Family Communication  :   Disposition Plan  :  Status is: Inpatient  Remains inpatient appropriate because:IV treatments appropriate due to intensity of illness or inability to take PO   Dispo: The patient is from: Home              Anticipated d/c is to: Home              Anticipated d/c date is: > 3 days              Patient currently is not medically stable to d/c.    Consults  :  none  Procedures  : None  DVT Prophylaxis  :  [place] lovenox  Lab Results  Component Value Date   PLT 232 06-18-20    Antibiotics  :    Anti-infectives (From admission, onward)   Start     Dose/Rate Route Frequency Ordered Stop   June 18, 2020 1000  remdesivir 100 mg in sodium chloride 0.9 % 100 mL IVPB       "Followed by" Linked Group Details   100 mg 200 mL/hr over 30 Minutes Intravenous Daily 05/05/2020 1301 05/24/20 0959   05/14/2020 1830  cefTRIAXone (ROCEPHIN) 1 g in sodium chloride 0.9 % 100 mL IVPB        1 g 200 mL/hr over 30 Minutes Intravenous Every 24 hours 05/14/2020 1827     05/11/2020 1830  azithromycin (ZITHROMAX) 500 mg in sodium chloride 0.9 % 250 mL IVPB        500 mg 250 mL/hr over 60 Minutes Intravenous Every 24 hours 05/26/2020 1827     04/30/2020 1400  remdesivir 200 mg in sodium chloride 0.9% 250 mL IVPB       "Followed by" Linked Group Details   200 mg 580 mL/hr over 30 Minutes Intravenous Once 05/19/20 1301 05/19/20 1502        Objective:   Vitals:   06/18/2020 1345 18-Jun-2020 1400  2020-06-06 1415 Jun 06, 2020 1430  BP: (!) 123/91 (!) 122/110 (!) 131/99 (!) 120/57  Pulse: (!) 107 (!) 109 (!) 109 (!) 110  Resp: (!) 21 (!) 22 (!) 21 (!) 29  Temp:      TempSrc:      SpO2: 98% 99% 97% 96%  Weight:        Wt Readings from Last 3 Encounters:   05/11/2020 100.5 kg  04/22/20 (!) 102.2 kg  04/15/20 99.4 kg     Intake/Output Summary (Last 24 hours) at June 06, 2020 1455 Last data filed at Jun 06, 2020 1007 Gross per 24 hour  Intake 600 ml  Output --  Net 600 ml     Physical Exam  Awake Alert, Oriented X 3, No new F.N deficits, Normal affect Symmetrical Chest wall movement, patient is mildly tachypneic, but good air entry, no wheezing RRR,No Gallops,Rubs or new Murmurs, No Parasternal Heave +ve B.Sounds, Abd Soft, No tenderness, No rebound - guarding or rigidity. No Cyanosis, Clubbing or edema, No new Rash or bruise       Data Review:    CBC Recent Labs  Lab 05/08/2020 1246 05/06/2020 1730 06/06/2020 0301  WBC 12.0* 13.0* 11.3*  HGB 19.3* 18.1* 17.6*  HCT 60.1* 56.9* 54.9*  PLT 221 227 232  MCV 99.3 97.9 96.1  MCH 31.9 31.2 30.8  MCHC 32.1 31.8 32.1  RDW 15.6* 15.4 15.3  LYMPHSABS 0.1* 0.1* 0.0*  MONOABS 0.0* 0.1 0.1  EOSABS 0.0 0.0 0.0  BASOSABS 0.0 0.0 0.0    Chemistries  Recent Labs  Lab 04/30/2020 1246 05/11/2020 1730 06-Jun-2020 0301  NA 140 139 141  K 5.2* 5.1 5.3*  CL 107 107 107  CO2 19* 17* 19*  GLUCOSE 132* 142* 170*  BUN 34* 36* 36*  CREATININE 1.81* 1.56* 1.56*  CALCIUM 8.3* 7.7* 7.6*  MG  --   --  2.1  AST 90* 87* 83*  ALT 55* 49* 43  ALKPHOS 40 37* 41  BILITOT 0.6 0.7 0.3   ------------------------------------------------------------------------------------------------------------------ Recent Labs    05/18/2020 1246  TRIG 133    Lab Results  Component Value Date   HGBA1C 5.7 (H) 04/11/2020   ------------------------------------------------------------------------------------------------------------------ No results for input(s): TSH, T4TOTAL, T3FREE, THYROIDAB in the last 72 hours.  Invalid input(s): FREET3 ------------------------------------------------------------------------------------------------------------------ Recent Labs    05/24/2020 1246 06-Jun-2020 0301  FERRITIN 2,074*  2,484*    Coagulation profile No results for input(s): INR, PROTIME in the last 168 hours.  Recent Labs    05/15/2020 1246 06/06/2020 0301  DDIMER 4.94* 4.36*    Cardiac Enzymes No results for input(s): CKMB, TROPONINI, MYOGLOBIN in the last 168 hours.  Invalid input(s): CK ------------------------------------------------------------------------------------------------------------------    Component Value Date/Time   BNP 805.4 (H) 04/30/2020 1720    Inpatient Medications  Scheduled Meds: . AeroChamber Plus Flo-Vu Large  1 each Other Once  . AeroChamber Plus Flo-Vu Large      . albuterol  2 puff Inhalation Q6H  . vitamin C  500 mg Oral Daily  . aspirin EC  81 mg Oral Daily  . atorvastatin  80 mg Oral Daily  . clopidogrel  75 mg Oral Daily  . enoxaparin (LOVENOX) injection  40 mg Subcutaneous Q24H  . folic acid  1 mg Oral Daily  . methylPREDNISolone (SOLU-MEDROL) injection  50 mg Intravenous Q12H  . [START ON 05/10/2020] Tofacitinib Citrate ER  11 mg Oral Daily  . zinc sulfate  220 mg Oral Daily   Continuous Infusions: . azithromycin Stopped (06/06/2020 0253)  .  cefTRIAXone (ROCEPHIN)  IV Stopped (05/18/2020 2200)  . remdesivir 100 mg in NS 100 mL Stopped (06-04-2020 1007)   PRN Meds:.acetaminophen, chlorpheniramine-HYDROcodone, guaiFENesin-dextromethorphan, nitroGLYCERIN, ondansetron **OR** ondansetron (ZOFRAN) IV, traZODone  Micro Results Recent Results (from the past 240 hour(s))  SARS Coronavirus 2 by RT PCR (hospital order, performed in [facility] hospital lab) Nasopharyngeal Nasopharyngeal Swab     Status: Abnormal   Collection Time: 05/26/2020 10:50 AM   Specimen: Nasopharyngeal Swab  Result Value Ref Range Status   SARS Coronavirus 2 POSITIVE (A) NEGATIVE Final    Comment: RESULT CALLED TO, READ BACK BY AND VERIFIED WITH: [place] RN 12:30 05/01/2020 ([email or web address]) (NOTE) SARS-CoV-2 target nucleic acids are DETECTED  SARS-CoV-2 RNA is generally detectable in upper respiratory  specimens  during the acute phase of infection.  Positive results are indicative  of the presence of the identified virus, but do not rule out bacterial infection or co-infection with other pathogens not detected by the test.  Clinical correlation with patient history and  other diagnostic information is necessary to determine patient infection status.  The expected result is negative.  Fact Sheet for Patients:   StrictlyIdeas.no   Fact Sheet for Healthcare Providers:   BankingDealers.co.za    This test is not yet approved or cleared by the [place] FDA and  has been authorized for detection and/or diagnosis of SARS-CoV-2 by FDA under an Emergency Use Authorization (EUA).  This EUA will remain in effect (meaning this test  can be used) for the duration of  the COVID-19 declaration under Section 564(b)(1) of the Act, 21 U.S.C. section 360-bbb-3(b)(1), unless the authorization is terminated or revoked sooner.  Performed at [facility], [place] [address], [place], [place] [address]   Blood Culture (routine x 2)     Status: None (Preliminary result)   Collection Time: 05/24/2020  1:15 PM   Specimen: BLOOD RIGHT ARM  Result Value Ref Range Status   Specimen Description BLOOD RIGHT ARM  Final   Special Requests   Final    BOTTLES DRAWN AEROBIC ONLY Blood Culture results may not be optimal due to an inadequate volume of blood received in culture bottles   Culture   Final    NO GROWTH < 24 HOURS Performed at [facility], [place] [address], [place],  [address]    Report Status PENDING  Incomplete    Radiology Reports CT Angio Chest PE W/Cm &/Or Wo Cm  Result Date: 04/28/2020 CLINICAL DATA:  COVID positivity with shortness of breath, initial encounter EXAM: CT ANGIOGRAPHY CHEST WITH CONTRAST TECHNIQUE: Multidetector CT imaging of the chest was performed using the standard protocol during bolus administration of  intravenous contrast. Multiplanar CT image reconstructions and MIPs were obtained to evaluate the vascular anatomy. CONTRAST:  41mL OMNIPAQUE IOHEXOL 350 MG/ML SOLN COMPARISON:  Chest x-ray from earlier in the same day, CT from 04/09/2020. FINDINGS: Cardiovascular: Thoracic aorta demonstrates atherosclerotic calcifications without aneurysmal dilatation. No significant opacification to evaluate for dissection is noted. No cardiac enlargement is seen. Coronary calcifications are noted. The pulmonary artery shows a normal branching pattern bilaterally. No filling defect to suggest pulmonary embolism is noted. Mediastinum/Nodes: Thoracic inlet is within normal limits. No sizable hilar or mediastinal adenopathy is noted. The esophagus as visualized is within normal limits. Lungs/Pleura: The lungs are well aerated bilaterally but demonstrate peripheral ground-glass opacities throughout both lungs consistent with the given clinical history of COVID-19 positivity. They appear significantly greater than that seen on the recent chest  x-ray from earlier in the same day. No sizable effusion or pneumothorax is noted. No sizable parenchymal nodule is seen. Upper Abdomen: Visualized upper abdomen is within normal limits. Musculoskeletal: Degenerative changes of the thoracic spine are noted. No acute rib abnormality is seen. Chronic appearing compression deformity of T5 is noted. This is stable from the prior CT. Stable T11 compression deformity is noted as well. Review of the MIP images confirms the above findings. IMPRESSION: No evidence of pulmonary emboli. Diffuse bilateral ground-glass opacities consistent with the given clinical history of COVID-19 positivity. These are greater than that seen on recent chest x-ray. No other acute abnormality is noted. Aortic Atherosclerosis (ICD10-I70.0). Electronically Signed   By: Inez Catalina M.D.   On: 05/04/2020 19:22   DG Chest Portable 1 View  Result Date: 05/24/2020 CLINICAL DATA:   Fever, shortness of breath, COVID-19 positive EXAM: PORTABLE CHEST 1 VIEW COMPARISON:  04/09/2020 FINDINGS: Cardiomegaly. Chronically increased interstitial markings within the peripheral and basilar aspects of the bilateral lung fields. No definite new superimposed airspace consolidation. No pleural effusion or pneumothorax. IMPRESSION: Similar appearance of chronic bilateral interstitial thickening without definite new superimposed airspace consolidation. Electronically Signed   By: Davina Poke D.O.   On: 04/27/2020 13:04     Phillips Climes M.D on 05-25-20 at 2:55 PM    [facility] -  Office  [phone]

## 2020-05-27 NOTE — ED Notes (Signed)
CPR -  Epi given 822 Pulse check - no pulse, continue CPR Bicarb in 823

## 2020-05-27 NOTE — ED Notes (Signed)
Wife bedside

## 2020-05-27 NOTE — ED Notes (Addendum)
Pt taken off Bipap by Nicki B. RT On 15 L NRB and 15 L salter Ernest with humidity

## 2020-05-27 NOTE — ED Notes (Addendum)
Dr. Waldron Labs notified about pts current condition. Per Dr. Amie Critchley pt needs heated High Flow [employer] [facility] or Bipap if high flow not available. RT notified.   Pts hands are cold with delayed cap refill. Pts skin is clammy. Pt has increased work of breathing, head bobbing. Pt can only say 1-2 words at a time. Pt does state that he is getting tired. Pt currently on 15 L salter [place] with humidity and 15 L NRB for a total of 30 L.

## 2020-05-27 NOTE — ED Notes (Signed)
Incentive spirometer used °

## 2020-05-27 NOTE — ED Notes (Addendum)
Pt working working hard to breath, only able to say one word at a time.

## 2020-05-27 NOTE — Progress Notes (Signed)
RT unsuccessful on ABG.

## 2020-05-27 NOTE — ED Notes (Signed)
Epi drip going at 26mcg/min

## 2020-05-27 NOTE — ED Notes (Signed)
1gram Ca given

## 2020-05-27 DEATH — deceased

## 2020-06-04 ENCOUNTER — Other Ambulatory Visit: Payer: Self-pay | Admitting: Family

## 2021-01-09 IMAGING — MR MR HEAD W/O CM
9 of 10 series · 35 of 48 positions shown · non-contrast
Comparison: None.

CLINICAL DATA: Dizziness

EXAM:
MRI HEAD WITHOUT CONTRAST
TECHNIQUE: Multiplanar, multiecho pulse sequences of the brain and surrounding
structures were obtained without intravenous contrast.

[Series 2: DWI · axial · 3.0mm · 0.98mm/px · z∈[-42,+105]mm · 8 of 100 slices shown (1 of 2)]
[im 1/100]
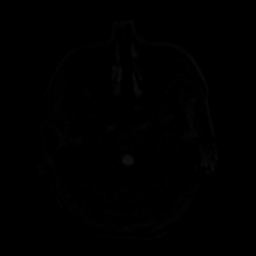
[im 12/100]
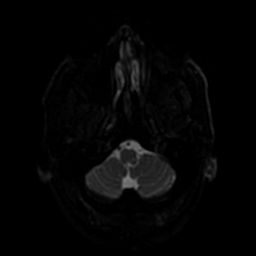
[im 34/100]
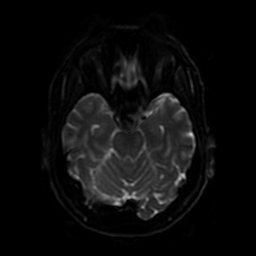
[im 45/100]
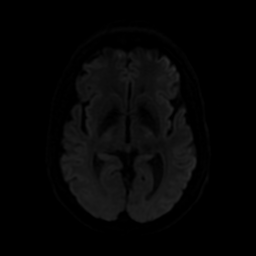
[im 56/100]
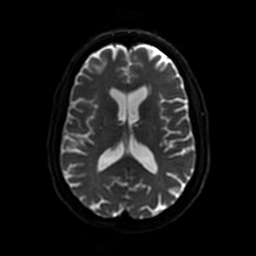
[im 67/100]
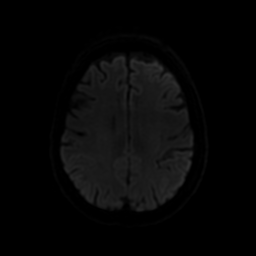
[im 89/100]
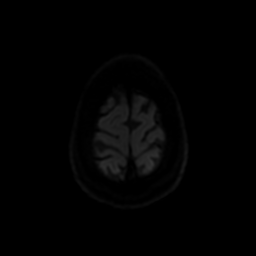
[im 100/100]
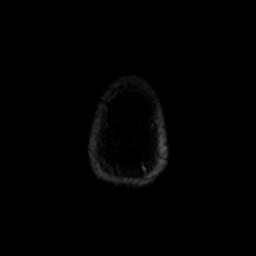

[Series 3: DWI · coronal · 4.0mm · 0.94mm/px · 7 of 74 slices shown (2 of 2)]
[im 1/74]
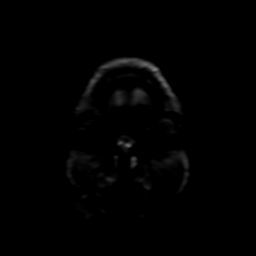
[im 13/74]
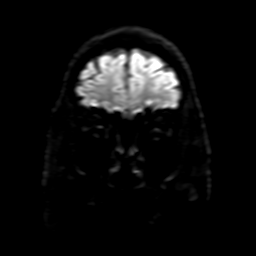
[im 25/74]
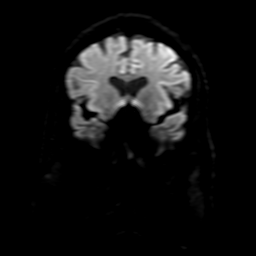
[im 37/74]
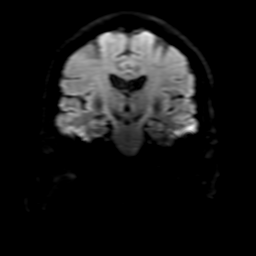
[im 49/74]
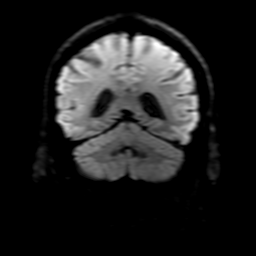
[im 61/74]
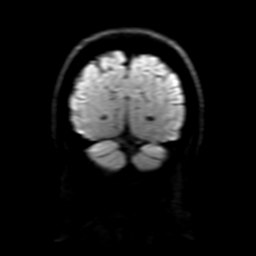
[im 74/74]
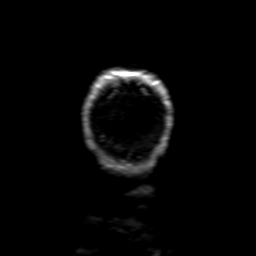

[Series 4: FLAIR · axial · 3.0mm · 0.43mm/px · z∈[-50,+100]mm · 2 of 26 slices shown (1 of 2)]
[im 1/26]
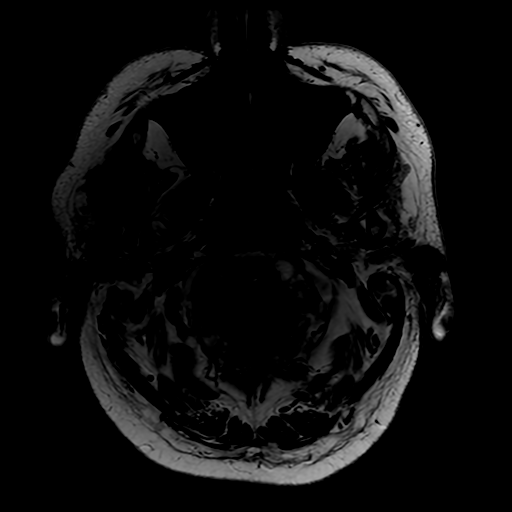
[im 26/26]
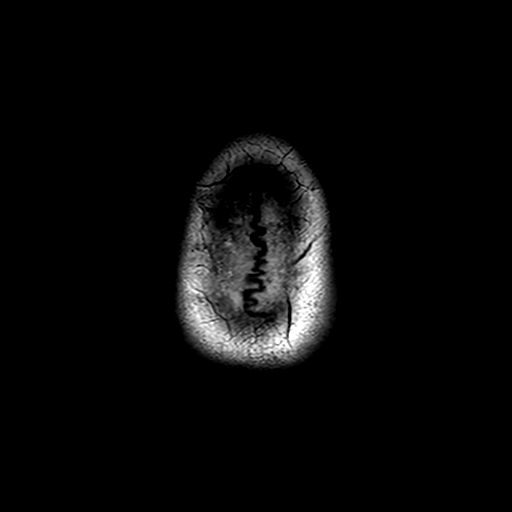

[Series 5: (person_name) · axial · 3.0mm · 0.47mm/px · z∈[-53,-16]mm · 3 of 104 slices shown]
[im 1/104]
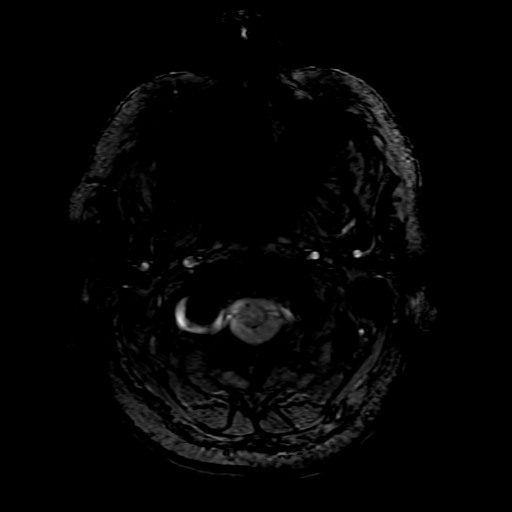
[im 13/104]
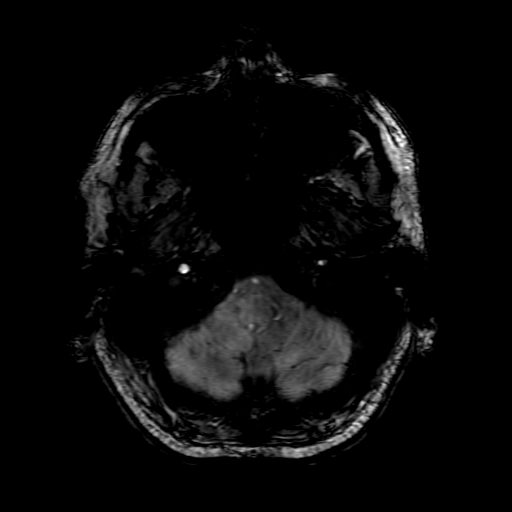
[im 26/104]
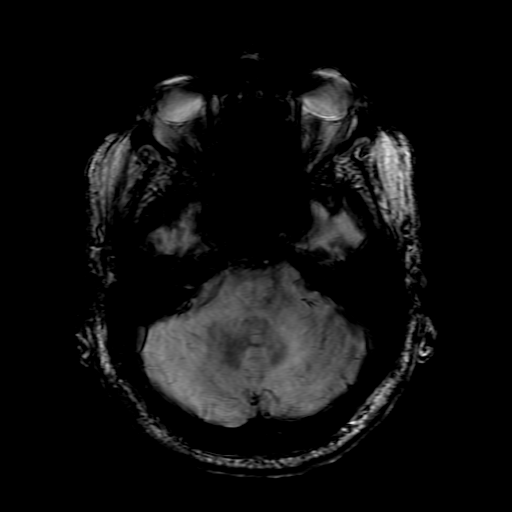

[Series 6: FLAIR · sagittal · 5.0mm · 0.47mm/px · 2 of 25 slices shown (2 of 2)]
[im 1/25]
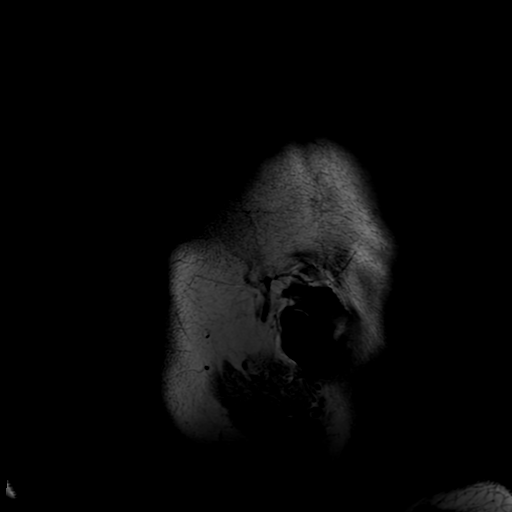
[im 25/25]
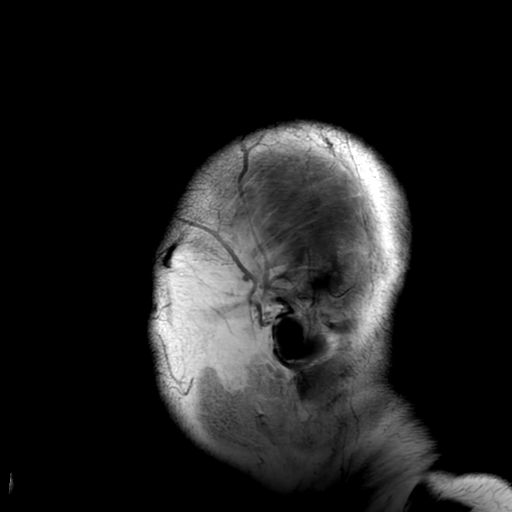

[Series 7: T2 · axial · 5.0mm · 0.47mm/px · z∈[-51,+98]mm · 2 of 26 slices shown (1 of 2)]
[im 1/26]
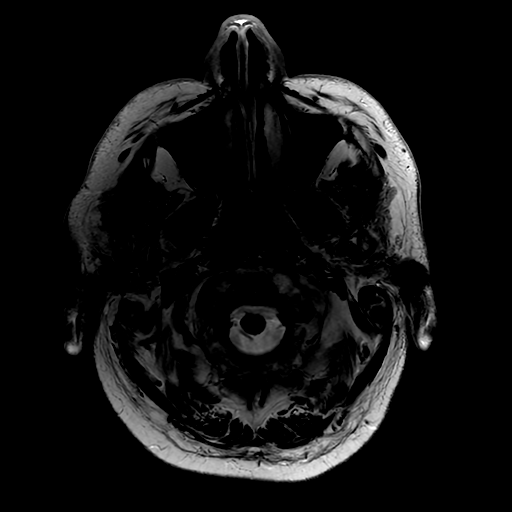
[im 26/26]
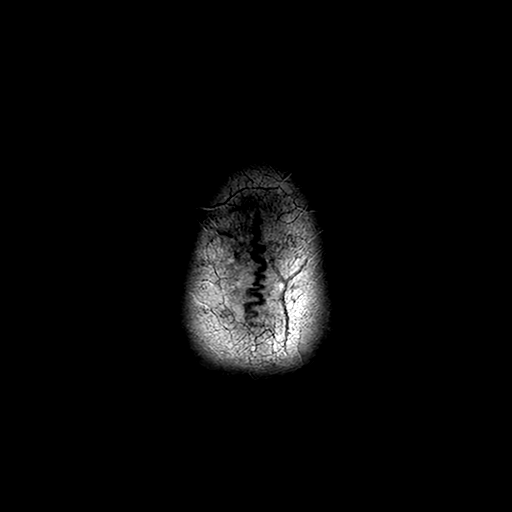

[Series 9: T2 · coronal · 5.0mm · 0.43mm/px · 3 of 32 slices shown (2 of 2)]
[im 1/32]
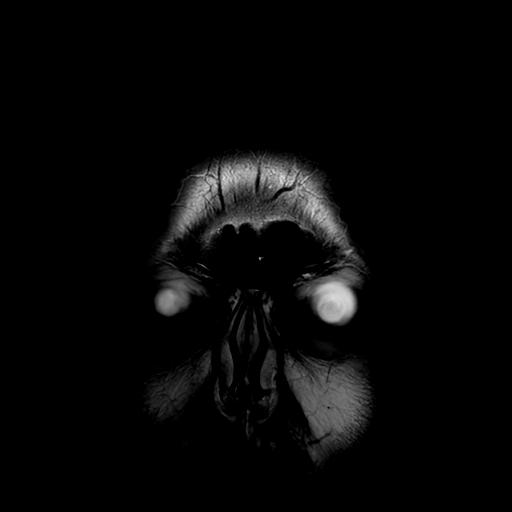
[im 16/32]
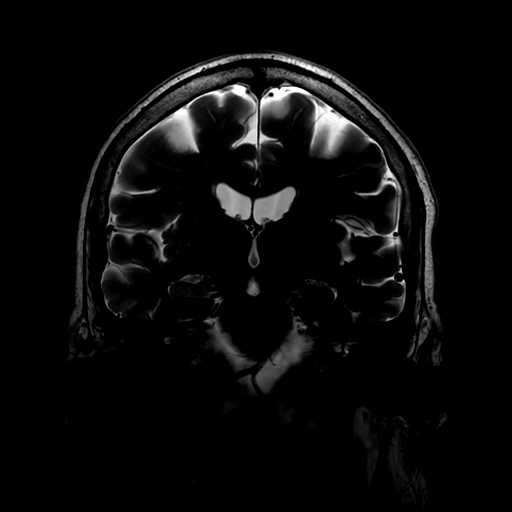
[im 32/32]
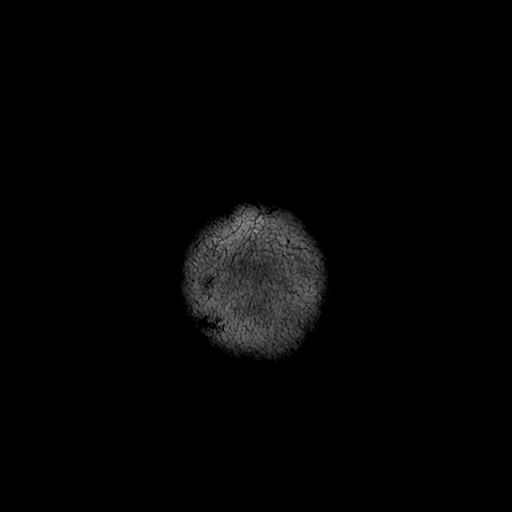

[Series 250: ADC · axial · 3.0mm · 0.98mm/px · z∈[-42,+105]mm · 5 of 50 slices shown (1 of 2)]
[im 1/50]
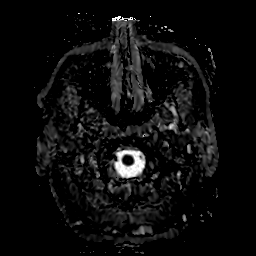
[im 13/50]
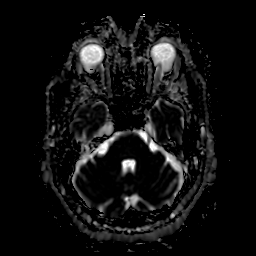
[im 25/50]
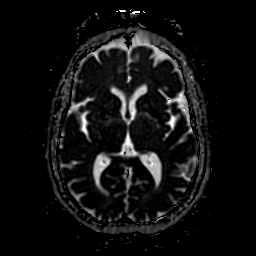
[im 37/50]
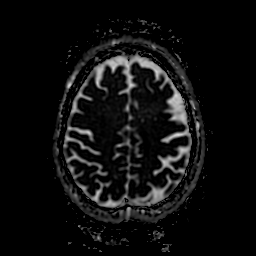
[im 50/50]
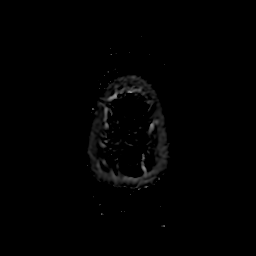

[Series 350: ADC · coronal · 4.0mm · 0.94mm/px · 3 of 37 slices shown (2 of 2)]
[im 1/37]
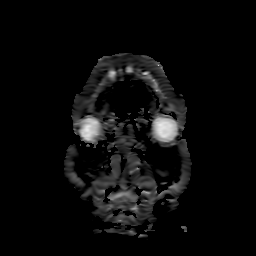
[im 19/37]
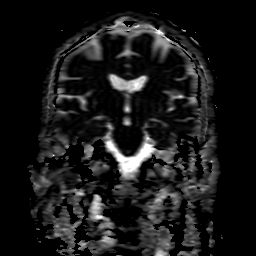
[im 37/37]
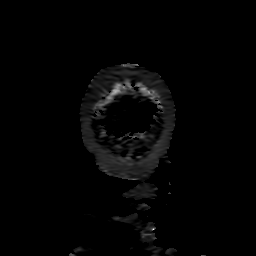

[35 of 48 positions shown; findings below may reference images not displayed]

FINDINGS: Brain: Two punctate nearby foci of diffusion hyperintensity at the
posterior aspect of the right superior frontal gyrus. Small size
limits evaluation on ADC. There is no evidence of intracranial
hemorrhage.

Patchy T2 hyperintensity in the supratentorial white matter is
nonspecific but may reflect minor chronic microvascular ischemic
changes.

Ventricles and sulci are within normal limits in size and
configuration. There is no intracranial mass or mass effect. There
is no hydrocephalus or extra-axial fluid collection.

Vascular: Major vessel flow voids at the skull base are preserved.

Skull and upper cervical spine: Normal marrow signal is preserved.

Sinuses/Orbits: Trace paranasal sinus mucosal thickening. Bilateral
lens replacements.

Other: Sella is unremarkable.  Mastoid air cells are clear.
IMPRESSION: Two punctate right frontal acute infarcts.
